# Patient Record
Sex: Female | Born: 1990
Health system: Southern US, Community
[De-identification: ages and names within clinical notes are randomized; demographics above are authoritative.]

## PROBLEM LIST (undated history)

## (undated) DIAGNOSIS — E785 Hyperlipidemia, unspecified: Secondary | ICD-10-CM

## (undated) DIAGNOSIS — D649 Anemia, unspecified: Secondary | ICD-10-CM

## (undated) HISTORY — DX: Anemia, unspecified: D64.9

## (undated) HISTORY — PX: WISDOM TOOTH EXTRACTION: SHX21

## (undated) HISTORY — DX: Hyperlipidemia, unspecified: E78.5

---

## 2015-04-11 ENCOUNTER — Emergency Department (HOSPITAL_COMMUNITY)
Admission: EM | Admit: 2015-04-11 | Discharge: 2015-04-11 | Disposition: A | Discharge status: Home / Self Care | Payer: BLUE CROSS/BLUE SHIELD | Source: Home / Self Care | Attending: Family Medicine | Admitting: Family Medicine

## 2015-04-11 ENCOUNTER — Encounter (HOSPITAL_COMMUNITY): Payer: Self-pay | Admitting: Emergency Medicine

## 2015-04-11 DIAGNOSIS — L02412 Cutaneous abscess of left axilla: Secondary | ICD-10-CM

## 2015-04-11 MED ORDER — SULFAMETHOXAZOLE-TRIMETHOPRIM 800-160 MG PO TABS: 2.0000 | ORAL_TABLET | Freq: Two times a day (BID) | ORAL | Status: DC

## 2015-04-11 MED ORDER — IBUPROFEN 600 MG PO TABS: 600.0000 mg | ORAL_TABLET | Freq: Three times a day (TID) | ORAL | Status: DC | PRN

## 2015-04-11 NOTE — ED Provider Notes (Signed)
Stacy [name] is a 24 y.o. female who presents to Urgent Care today for left axillary abscess present for 3 days. Symptoms worsening recently. She was able to express a tiny amount of pus 2 days ago. No fevers chills nausea vomiting or diarrhea. She's tried ibuprofen for pain which helps. She feels well otherwise. Patient avoids become pregnant by not having sex.   History reviewed. No pertinent past medical history. History reviewed. No pertinent past surgical history. History  Substance Use Topics  . Smoking status: Never Smoker   . Smokeless tobacco: Not on file  . Alcohol Use: Yes   ROS as above Medications: No current facility-administered medications for this encounter.   Current Outpatient Prescriptions  Medication Sig Dispense Refill  . atorvastatin (LIPITOR) 10 MG tablet Take 10 mg by mouth daily.    [name]. ibuprofen (ADVIL,MOTRIN) 600 MG tablet Take 1 tablet (600 mg total) by mouth every 8 (eight) hours as needed. 30 tablet 0  . sulfamethoxazole-trimethoprim (BACTRIM DS,SEPTRA DS) 800-160 MG per tablet Take 2 tablets by mouth 2 (two) times daily. 28 tablet 0   Allergies  Allergen Reactions  . Doxycycline Other (See Comments)    Decreased WBC     Exam:  BP 125/85 mmHg  Pulse 81  Temp(Src) 98.9 F (37.2 C) (Oral)  Resp 12  SpO2 98%  LMP 03/14/2015 Gen: Well NAD HEENT: EOMI,  MMM Exts: Brisk capillary refill, warm and well perfused.  Left axilla indurated tender 1 cm papule. No fluctuance.  No results found for this or any previous visit (from the past 24 hour(s)). No results found.  Assessment and Plan: 24 y.o. female with left axillary abscess. Not yet fluctuant or drainable. Trial of oral antibiotics. We'll use Bactrim this patient is allergic to doxycycline.  Discussed warning signs or symptoms. Please see discharge instructions. Patient expresses understanding.     Rodolph BongEvan S Samona Chihuahua, MD 04/11/15 [phone]

## 2015-04-11 NOTE — ED Notes (Signed)
C/o abscess under left axilla onset 3 days... Getting bigger and more painful Reports drainage Denies fevers, and chills Alert, no signs of acute distress.

## 2015-04-11 NOTE — Discharge Instructions (Signed)
Thank you for coming in today. Take the Bactrim antibiotics. If not better please return for drainage.   Abscess An abscess is an infected area that contains a collection of pus and debris.It can occur in almost any part of the body. An abscess is also known as a furuncle or boil. CAUSES  An abscess occurs when tissue gets infected. This can occur from blockage of oil or sweat glands, infection of hair follicles, or a minor injury to the skin. As the body tries to fight the infection, pus collects in the area and creates pressure under the skin. This pressure causes pain. People with weakened immune systems have difficulty fighting infections and get certain abscesses more often.  SYMPTOMS Usually an abscess develops on the skin and becomes a painful mass that is red, warm, and tender. If the abscess forms under the skin, you may feel a moveable soft area under the skin. Some abscesses break open (rupture) on their own, but most will continue to get worse without care. The infection can spread deeper into the body and eventually into the bloodstream, causing you to feel ill.  DIAGNOSIS  Your caregiver will take your medical history and perform a physical exam. A sample of fluid may also be taken from the abscess to determine what is causing your infection. TREATMENT  Your caregiver may prescribe antibiotic medicines to fight the infection. However, taking antibiotics alone usually does not cure an abscess. Your caregiver may need to make a small cut (incision) in the abscess to drain the pus. In some cases, gauze is packed into the abscess to reduce pain and to continue draining the area. HOME CARE INSTRUCTIONS   Only take over-the-counter or prescription medicines for pain, discomfort, or fever as directed by your caregiver.  If you were prescribed antibiotics, take them as directed. Finish them even if you start to feel better.  If gauze is used, follow your caregiver's directions for changing  the gauze.  To avoid spreading the infection:  Keep your draining abscess covered with a bandage.  Wash your hands well.  Do not share personal care items, towels, or whirlpools with others.  Avoid skin contact with others.  Keep your skin and clothes clean around the abscess.  Keep all follow-up appointments as directed by your caregiver. SEEK MEDICAL CARE IF:   You have increased pain, swelling, redness, fluid drainage, or bleeding.  You have muscle aches, chills, or a general ill feeling.  You have a fever. MAKE SURE YOU:   Understand these instructions.  Will watch your condition.  Will get help right away if you are not doing well or get worse. Document Released: 07/11/2005 Document Revised: 04/01/2012 Document Reviewed: 12/14/2011 [facility] Patient Information 2015 [place], [place]. This information is not intended to replace advice given to you by your health care provider. Make sure you discuss any questions you have with your health care provider.

## 2015-04-21 ENCOUNTER — Encounter (HOSPITAL_COMMUNITY): Payer: Self-pay | Admitting: Emergency Medicine

## 2015-04-21 ENCOUNTER — Emergency Department (INDEPENDENT_AMBULATORY_CARE_PROVIDER_SITE_OTHER)
Admission: EM | Admit: 2015-04-21 | Discharge: 2015-04-21 | Disposition: A | Discharge status: Home / Self Care | Payer: BLUE CROSS/BLUE SHIELD | Source: Home / Self Care | Attending: Family Medicine | Admitting: Family Medicine

## 2015-04-21 DIAGNOSIS — L02412 Cutaneous abscess of left axilla: Secondary | ICD-10-CM

## 2015-04-21 MED ORDER — SULFAMETHOXAZOLE-TRIMETHOPRIM 800-160 MG PO TABS: 1.0000 | ORAL_TABLET | Freq: Two times a day (BID) | ORAL | Status: AC

## 2015-04-21 NOTE — ED Notes (Signed)
Pt returns today for abscess under left arm recheck Pt was seen last week, treated with ATB's , tender knot still under arm No drainage noted or redness noted Swollen lymph nodes to neck area with flu like sx's  Temp 99.4

## 2015-04-21 NOTE — ED Provider Notes (Signed)
CSN: [id number]     Arrival date & time 04/21/15  1613 History   First MD [id number] Contact with Patient 04/21/15 1751     Chief Complaint  Patient presents with  . Wound Check  . Adenopathy   (Consider location/radiation/quality/duration/timing/severity/associated sxs/prior Treatment) Patient is a 24 y.o. female presenting with wound check. The history is provided by the patient and a parent.  Wound Check This is a recurrent problem. The current episode started more than 1 week ago (seen 6/27 for left axillary abscess, given septra ds, took allwithout problems but abscess not resolved.). The problem has been gradually improving. Associated symptoms include headaches.    History reviewed. No pertinent past medical history. History reviewed. No pertinent past surgical history. No family history on file. History  Substance Use Topics  . Smoking status: Never Smoker   . Smokeless tobacco: Not on file  . Alcohol Use: Yes   OB History    No data available     Review of Systems  Constitutional: Negative.   Skin: Positive for rash.  Neurological: Positive for headaches.  Hematological: Positive for adenopathy.    Allergies  Doxycycline  Home Medications   Prior to Admission medications   Medication Sig Start Date End Date Taking? Authorizing Provider  atorvastatin (LIPITOR) 10 MG tablet Take 10 mg by mouth daily.    Historical Provider, MD  ibuprofen (ADVIL,MOTRIN) 600 MG tablet Take 1 tablet (600 mg total) by mouth every 8 (eight) hours as needed. 04/11/15   Rodolph BongEvan S Corey, MD  sulfamethoxazole-trimethoprim (BACTRIM DS,SEPTRA DS) 800-160 MG per tablet Take 1 tablet by mouth 2 (two) times daily. 04/21/15 04/28/15  Linna HoffJames D Ether Wolters, MD   BP 120/86 mmHg  Pulse 84  Temp(Src) 99.4 F (37.4 C) (Oral)  Resp 18  SpO2 98%  LMP 03/14/2015 Physical Exam  Constitutional: She is oriented to person, place, and time. She appears well-developed and well-nourished. She appears distressed.   Musculoskeletal:  Left ax adenopathy.  Lymphadenopathy:    She has cervical adenopathy.  Neurological: She is alert and oriented to person, place, and time.  Skin: Skin is warm and dry. There is erythema.  1.5 cm fluctuant abscess to left axilla with assoc adenopathy.  Nursing note and vitals reviewed.   ED Course  INCISION AND DRAINAGE Date/Time: 04/21/2015 6:31 PM Performed by: Linna HoffKINDL, Destyn Schuyler D Authorized by: Bradd CanaryKINDL, Shaughnessy Gethers D Consent: Verbal consent obtained. Consent given by: patient Type: abscess Body area: trunk Local anesthetic: topical anesthetic Patient sedated: no Scalpel size: 11 Incision type: single straight Complexity: simple Drainage: purulent Drainage amount: moderate Wound treatment: wound left open Patient tolerance: Patient tolerated the procedure well with no immediate complications Comments: Cultured abscess fluid.   (including critical care time) Labs Review Labs Reviewed  WOUND CULTURE    Imaging Review No results found.   MDM   1. Abscess of axilla, left        Linna HoffJames D Aydan Levitz, MD 04/21/15 [phone]

## 2015-04-21 NOTE — Discharge Instructions (Signed)
Warm compress twice a day when you take the antibiotic, take all of medicine, return as needed. °

## 2015-04-24 LAB — WOUND CULTURE: Culture: NO GROWTH

## 2015-04-25 NOTE — ED Notes (Signed)
Final report of wound culture negative. No further action required

## 2015-12-02 ENCOUNTER — Ambulatory Visit (INDEPENDENT_AMBULATORY_CARE_PROVIDER_SITE_OTHER): Payer: 59 | Admitting: Family Medicine

## 2015-12-02 VITALS — BP 110/80 | HR 100 | Temp 98.5°F | Resp 18 | Ht 64.57 in | Wt 121.8 lb

## 2015-12-02 DIAGNOSIS — J029 Acute pharyngitis, unspecified: Secondary | ICD-10-CM

## 2015-12-02 DIAGNOSIS — H9201 Otalgia, right ear: Secondary | ICD-10-CM | POA: Diagnosis not present

## 2015-12-02 DIAGNOSIS — B349 Viral infection, unspecified: Secondary | ICD-10-CM

## 2015-12-02 LAB — POCT RAPID STREP A (OFFICE): Rapid Strep A Screen: NEGATIVE

## 2015-12-02 MED ORDER — AMOXICILLIN 875 MG PO TABS: 875.0000 mg | ORAL_TABLET | Freq: Two times a day (BID) | ORAL | Status: DC

## 2015-12-02 NOTE — Patient Instructions (Signed)
Drink plenty of fluids and get enough rest  I recommend taking some Sudafed or using Afrin nose spray prior to flying since you're already having ear pain.  If the ear gets acutely worse go ahead and begin taking the course of amoxicillin 875 twice daily  Tylenol and/or ibuprofen for aching or fever  Lozenges for the throat pain  Return as needed or feel free to call if necessary.

## 2015-12-02 NOTE — Progress Notes (Signed)
Patient ID: Stacy [name], female    DOB: 1991-01-15  Age: 25 y.o. MRN: [id number]  Chief Complaint  Patient presents with  . Sore Throat    x 4 days  . 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 She has been ill for for 5 days. She has had some sore throat. She is gotten feeling worse. She has a hard time swallowing. Feels similar to how she is felt in the past which she's had strep. She has some pain in her right ear now. She does have slight cough. Headache and pressure, but not a lot of nasal drainage.  Current allergies, medications, problem list, past/family and social histories reviewed.  Objective:  BP 110/80 mmHg  Pulse 100  Temp(Src) 98.5 F (36.9 C) (Oral)  Resp 18  Ht 5' 4.57" (1.64 m)  Wt 121 lb 12.8 oz (55.248 kg)  BMI 20.54 kg/m2  SpO2 93%  LMP 11/10/2015  Pleasant young lady in no major distress. Her TMs are normal. Throat has mild erythema without any exudate or edema. Neck supple without significant nodes. Chest clear to auscultation. Heart regular.  Assessment & Plan:   Assessment: 1. Acute pharyngitis, unspecified etiology   2. Otalgia of right ear   3. Viral syndrome       Plan: Check a strep test Results for orders placed or performed in visit on 12/02/15  POCT rapid strep A  Result Value Ref Range   Rapid Strep A Screen Negative Negative    Orders Placed This Encounter  Procedures  . POCT rapid strep A    Meds ordered this encounter  Medications  . amoxicillin (AMOXIL) 875 MG tablet    Sig: Take 1 tablet (875 mg total) by mouth 2 (two) times daily.    Dispense:  20 tablet    Refill:  0     Patient is flying to [place]. Even though the ear looks fine I gave her a prescription for antibiotics to begin if her ear is getting worse.    Patient Instructions  Drink plenty of fluids and get enough rest  I recommend taking some Sudafed or using Afrin nose spray prior to  flying since you're already having ear pain.  If the ear gets acutely worse go ahead and begin taking the course of amoxicillin 875 twice daily  Tylenol and/or ibuprofen for aching or fever  Lozenges for the throat pain  Return as needed or feel free to call if necessary.     Return if symptoms worsen or fail to improve.   Stacy Gaulin, MD 12/02/2015

## 2016-01-23 ENCOUNTER — Encounter: Payer: Self-pay | Admitting: Internal Medicine

## 2016-01-23 ENCOUNTER — Ambulatory Visit (INDEPENDENT_AMBULATORY_CARE_PROVIDER_SITE_OTHER): Payer: 59 | Admitting: Internal Medicine

## 2016-01-23 VITALS — BP 124/84 | HR 92 | Temp 98.7°F | Resp 16 | Ht 64.0 in | Wt 121.0 lb

## 2016-01-23 DIAGNOSIS — Z0000 Encounter for general adult medical examination without abnormal findings: Secondary | ICD-10-CM

## 2016-01-23 DIAGNOSIS — L210 Seborrhea capitis: Secondary | ICD-10-CM

## 2016-01-23 DIAGNOSIS — E785 Hyperlipidemia, unspecified: Secondary | ICD-10-CM

## 2016-01-23 DIAGNOSIS — E7849 Other hyperlipidemia: Secondary | ICD-10-CM | POA: Insufficient documentation

## 2016-01-23 DIAGNOSIS — D509 Iron deficiency anemia, unspecified: Secondary | ICD-10-CM | POA: Insufficient documentation

## 2016-01-23 MED ORDER — KETOCONAZOLE 1 % EX SHAM: MEDICATED_SHAMPOO | CUTANEOUS | Status: DC

## 2016-01-23 NOTE — Assessment & Plan Note (Signed)
Getting a vegetarian with fish diet Irregular exercise Will check lipid panel Given her age and low risk and would not recommend a statin, which I discussed with her Monitor cholesterol Work on increasing exercise

## 2016-01-23 NOTE — Assessment & Plan Note (Signed)
Ketoconazole shampoo reviewed-continue to use weekly Can refer to dermatology if this is no longer effective

## 2016-01-23 NOTE — Patient Instructions (Signed)
Test(s) ordered today. Your results will be released to MyChart (or called to you) after review, usually within 72hours after test completion. If any changes need to be made, you will be notified at that same time.  All other Health Maintenance issues reviewed.   All recommended immunizations and age-appropriate screenings are up-to-date or discussed.  No immunizations administered today.   Medications reviewed and updated.  No changes recommended at this time.  Your prescription(s) have been submitted to your pharmacy. Please take as directed and contact our office if you believe you are having problem(s) with the medication(s).    Health Maintenance, Female Adopting a healthy lifestyle and getting preventive care can go a long way to promote health and wellness. Talk with your health care provider about what schedule of regular examinations is right for you. This is a good chance for you to check in with your provider about disease prevention and staying healthy. In between checkups, there are plenty of things you can do on your own. Experts have done a lot of research about which lifestyle changes and preventive measures are most likely to keep you healthy. Ask your health care provider for more information. WEIGHT AND DIET  Eat a healthy diet  Be sure to include plenty of vegetables, fruits, low-fat dairy products, and lean protein.  Do not eat a lot of foods high in solid fats, added sugars, or salt.  Get regular exercise. This is one of the most important things you can do for your health.  Most adults should exercise for at least 150 minutes each week. The exercise should increase your heart rate and make you sweat (moderate-intensity exercise).  Most adults should also do strengthening exercises at least twice a week. This is in addition to the moderate-intensity exercise.  Maintain a healthy weight  Body mass index (BMI) is a measurement that can be used to identify possible  weight problems. It estimates body fat based on height and weight. Your health care provider can help determine your BMI and help you achieve or maintain a healthy weight.  For females 20 years of age and older:   A BMI below 18.5 is considered underweight.  A BMI of 18.5 to 24.9 is normal.  A BMI of 25 to 29.9 is considered overweight.  A BMI of 30 and above is considered obese.  Watch levels of cholesterol and blood lipids  You should start having your blood tested for lipids and cholesterol at 25 years of age, then have this test every 5 years.  You may need to have your cholesterol levels checked more often if:  Your lipid or cholesterol levels are high.  You are older than 25 years of age.  You are at high risk for heart disease.  CANCER SCREENING   Lung Cancer  Lung cancer screening is recommended for adults 55-80 years old who are at high risk for lung cancer because of a history of smoking.  A yearly low-dose CT scan of the lungs is recommended for people who:  Currently smoke.  Have quit within the past 15 years.  Have at least a 30-pack-year history of smoking. A pack year is smoking an average of one pack of cigarettes a day for 1 year.  Yearly screening should continue until it has been 15 years since you quit.  Yearly screening should stop if you develop a health problem that would prevent you from having lung cancer treatment.  Breast Cancer  Practice breast self-awareness. This means   how your breasts normally appear and feel.  It also means doing regular breast self-exams. Let your health care provider know about any changes, no matter how small.  If you are in your 20s or 30s, you should have a clinical breast exam (CBE) by a health care provider every 1-3 years as part of a regular health exam.  If you are 59 or older, have a CBE every year. Also consider having a breast X-ray (mammogram) every year.  If you have a family history of  breast cancer, talk to your health care provider about genetic screening.  If you are at high risk for breast cancer, talk to your health care provider about having an MRI and a mammogram every year.  Breast cancer gene (BRCA) assessment is recommended for women who have family members with BRCA-related cancers. BRCA-related cancers include:  Breast.  Ovarian.  Tubal.  Peritoneal cancers.  Results of the assessment will determine the need for genetic counseling and BRCA1 and BRCA2 testing. Cervical Cancer Your health care provider may recommend that you be screened regularly for cancer of the pelvic organs (ovaries, uterus, and vagina). This screening involves a pelvic examination, including checking for microscopic changes to the surface of your cervix (Pap test). You may be encouraged to have this screening done every 3 years, beginning at age 61.  For women ages 41-65, health care providers may recommend pelvic exams and Pap testing every 3 years, or they may recommend the Pap and pelvic exam, combined with testing for human papilloma virus (HPV), every 5 years. Some types of HPV increase your risk of cervical cancer. Testing for HPV may also be done on women of any age with unclear Pap test results.  Other health care providers may not recommend any screening for nonpregnant women who are considered low risk for pelvic cancer and who do not have symptoms. Ask your health care provider if a screening pelvic exam is right for you.  If you have had past treatment for cervical cancer or a condition that could lead to cancer, you need Pap tests and screening for cancer for at least 20 years after your treatment. If Pap tests have been discontinued, your risk factors (such as having a new sexual partner) need to be reassessed to determine if screening should resume. Some women have medical problems that increase the chance of getting cervical cancer. In these cases, your health care provider may  recommend more frequent screening and Pap tests. Colorectal Cancer  This type of cancer can be detected and often prevented.  Routine colorectal cancer screening usually begins at 25 years of age and continues through 25 years of age.  Your health care provider may recommend screening at an earlier age if you have risk factors for colon cancer.  Your health care provider may also recommend using home test kits to check for hidden blood in the stool.  A small camera at the end of a tube can be used to examine your colon directly (sigmoidoscopy or colonoscopy). This is done to check for the earliest forms of colorectal cancer.  Routine screening usually begins at age 47.  Direct examination of the colon should be repeated every 5-10 years through 25 years of age. However, you may need to be screened more often if early forms of precancerous polyps or small growths are found. Skin Cancer  Check your skin from head to toe regularly.  Tell your health care provider about any new moles or changes in moles,  especially if there is a change in a mole's shape or color.  Also tell your health care provider if you have a mole that is larger than the size of a pencil eraser.  Always use sunscreen. Apply sunscreen liberally and repeatedly throughout the day.  Protect yourself by wearing long sleeves, pants, a wide-brimmed hat, and sunglasses whenever you are outside. HEART DISEASE, DIABETES, AND HIGH BLOOD PRESSURE   High blood pressure causes heart disease and increases the risk of stroke. High blood pressure is more likely to develop in:  People who have blood pressure in the high end of the normal range (130-139/85-89 mm Hg).  People who are overweight or obese.  People who are African American.  If you are 76-56 years of age, have your blood pressure checked every 3-5 years. If you are 7 years of age or older, have your blood pressure checked every year. You should have your blood  pressure measured twice--once when you are at a hospital or clinic, and once when you are not at a hospital or clinic. Record the average of the two measurements. To check your blood pressure when you are not at a hospital or clinic, you can use:  An automated blood pressure machine at a pharmacy.  A home blood pressure monitor.  If you are between 42 years and 22 years old, ask your health care provider if you should take aspirin to prevent strokes.  Have regular diabetes screenings. This involves taking a blood sample to check your fasting blood sugar level.  If you are at a normal weight and have a low risk for diabetes, have this test once every three years after 25 years of age.  If you are overweight and have a high risk for diabetes, consider being tested at a younger age or more often. PREVENTING INFECTION  Hepatitis B  If you have a higher risk for hepatitis B, you should be screened for this virus. You are considered at high risk for hepatitis B if:  You were born in a country where hepatitis B is common. Ask your health care provider which countries are considered high risk.  Your parents were born in a high-risk country, and you have not been immunized against hepatitis B (hepatitis B vaccine).  You have HIV or AIDS.  You use needles to inject street drugs.  You live with someone who has hepatitis B.  You have had sex with someone who has hepatitis B.  You get hemodialysis treatment.  You take certain medicines for conditions, including cancer, organ transplantation, and autoimmune conditions. Hepatitis C  Blood testing is recommended for:  Everyone born from [birth year] through 1965.  Anyone with known risk factors for hepatitis C. Sexually transmitted infections (STIs)  You should be screened for sexually transmitted infections (STIs) including gonorrhea and chlamydia if:  You are sexually active and are younger than 25 years of age.  You are older than 25 years  of age and your health care provider tells you that you are at risk for this type of infection.  Your sexual activity has changed since you were last screened and you are at an increased risk for chlamydia or gonorrhea. Ask your health care provider if you are at risk.  If you do not have HIV, but are at risk, it may be recommended that you take a prescription medicine daily to prevent HIV infection. This is called pre-exposure prophylaxis (PrEP). You are considered at risk if:  You are sexually active  and do not regularly use condoms or know the HIV status of your partner(s).  You take drugs by injection.  You are sexually active with a partner who has HIV. Talk with your health care provider about whether you are at high risk of being infected with HIV. If you choose to begin PrEP, you should first be tested for HIV. You should then be tested every 3 months for as long as you are taking PrEP.  PREGNANCY   If you are premenopausal and you may become pregnant, ask your health care provider about preconception counseling.  If you may become pregnant, take 400 to 800 micrograms (mcg) of folic acid every day.  If you want to prevent pregnancy, talk to your health care provider about birth control (contraception). OSTEOPOROSIS AND MENOPAUSE   Osteoporosis is a disease in which the bones lose minerals and strength with aging. This can result in serious bone fractures. Your risk for osteoporosis can be identified using a bone density scan.  If you are 51 years of age or older, or if you are at risk for osteoporosis and fractures, ask your health care provider if you should be screened.  Ask your health care provider whether you should take a calcium or vitamin D supplement to lower your risk for osteoporosis.  Menopause may have certain physical symptoms and risks.  Hormone replacement therapy may reduce some of these symptoms and risks. Talk to your health care provider about whether hormone  replacement therapy is right for you.  HOME CARE INSTRUCTIONS   Schedule regular health, dental, and eye exams.  Stay current with your immunizations.   Do not use any tobacco products including cigarettes, chewing tobacco, or electronic cigarettes.  If you are pregnant, do not drink alcohol.  If you are breastfeeding, limit how much and how often you drink alcohol.  Limit alcohol intake to no more than 1 drink per day for nonpregnant women. One drink equals 12 ounces of beer, 5 ounces of wine, or 1 ounces of hard liquor.  Do not use street drugs.  Do not share needles.  Ask your health care provider for help if you need support or information about quitting drugs.  Tell your health care provider if you often feel depressed.  Tell your health care provider if you have ever been abused or do not feel safe at home.   This information is not intended to replace advice given to you by your health care provider. Make sure you discuss any questions you have with your health care provider.   Document Released: 04/16/2011 Document Revised: 10/22/2014 Document Reviewed: 09/02/2013 Elsevier Interactive Patient Education [employer].

## 2016-01-23 NOTE — Progress Notes (Signed)
Pre visit review using our clinic review tool, if applicable. No additional management support is needed unless otherwise documented below in the visit note. 

## 2016-01-23 NOTE — Assessment & Plan Note (Signed)
Check iron levels, CBC Taking iron supplementation daily

## 2016-01-23 NOTE — Progress Notes (Signed)
Subjective:    Patient ID: Stacy [name], female    DOB: 11/08/1990, 25 y.o.   MRN: [id number]  HPI She is here to establish with a new pcp.  She is here for a physical exam.   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     She is a history of high cholesterol. She was on medication for approximately 5 months.  She does have a family history of high cholesterol. She has several members. His cholesterol measurements.  She is exercising irregularly.  She is now a Ambulance personpescatarian.  She is concerned about what her cholesterol is normal.  She has a history of severe dandruff. She has used ketoconazole shampoo in the past and typically works well, but does not feel be working as well. She has seen dermatology in the past. She does need a refill today.  Ann deficiency anemia: She has a history of iron deficiency anemia. She is currently taking iron supplements.  Medications and allergies reviewed with patient and updated if appropriate.  Patient Active Problem List   Diagnosis Date Noted  . Hyperlipidemia 01/23/2016  . Dandruff 01/23/2016  . Iron deficiency anemia 01/23/2016    Current Outpatient Prescriptions on File Prior to Visit  Medication Sig Dispense Refill  . ibuprofen (ADVIL,MOTRIN) 600 MG tablet Take 1 tablet (600 mg total) by mouth every 8 (eight) hours as needed. 30 tablet 0   No current facility-administered medications on file prior to visit.    Past Medical History  Diagnosis Date  . Hyperlipidemia   . Anemia     Past Surgical History  Procedure Laterality Date  . Wisdom tooth extraction      Social History   Social History  . Marital Status: Single    Spouse Name: N/A  . Number of Children: N/A  . Years of Education: N/A   Social History Main Topics  . Smoking status: Never Smoker   . Smokeless tobacco: None  . Alcohol Use: Yes  . Drug Use: No  . Sexual Activity: Not Asked   Other Topics Concern  . None   Social History Narrative    Family History  Problem  Relation Age of Onset  . Diabetes Mother   . Autoimmune disease Mother   . Hyperlipidemia Father   . Cancer Maternal Grandmother     breast  . Cancer Paternal Grandmother     breast  . Heart disease Paternal Grandmother     Review of Systems  Constitutional: Negative for fever and chills.  Eyes: Negative for visual disturbance.  Respiratory: Negative for cough, shortness of breath and wheezing.   Cardiovascular: Negative for chest pain, palpitations and leg swelling.  Gastrointestinal: Negative for nausea, diarrhea, constipation and blood in stool. Abdominal pain: occasional.       No gerd  Genitourinary: Negative for dysuria and hematuria.  Musculoskeletal: Positive for back pain (from sitting). Negative for myalgias and arthralgias.  Skin: Negative for color change and rash.       Dandruff   Neurological: Negative for dizziness, light-headedness and headaches.  Psychiatric/Behavioral: Negative for dysphoric mood. The patient is not nervous/anxious.        Objective:   Filed Vitals:   01/23/16 1455  BP: 124/84  Pulse: 92  Temp: 98.7 F (37.1 C)  Resp: 16   Filed Weights   01/23/16 1455  Weight: 121 lb (54.885 kg)   Body mass index is 20.76 kg/(m^2).   Physical Exam Constitutional: She appears well-developed and well-nourished. No distress.  HENT:  Head: Normocephalic and atraumatic.  Right Ear: External ear normal. Normal ear canal and TM Left Ear: External ear normal.  Normal ear canal and TM Mouth/Throat: Oropharynx is clear and moist.  Eyes: Conjunctivae and EOM are normal.  Neck: Neck supple. No tracheal deviation present. No thyromegaly present.  Cardiovascular: Normal rate, regular rhythm and normal heart sounds.   No murmur heard.  No edema. Pulmonary/Chest: Effort normal and breath sounds normal. No respiratory distress. She has no wheezes. She has no rales.  Breast: deferred to Gyn Abdominal: Soft. She exhibits no distension. There is no tenderness.    Lymphadenopathy: She has no cervical adenopathy.  Skin: Skin is warm and dry. She is not diaphoretic.  Psychiatric: She has a normal mood and affect. Her behavior is normal.       Assessment & Plan:   Physical exam: Screening blood work ordered Immunizations up to date Gyn Up to date  Exercise - irregular exercise - stressed regular exercise Weight - normal Skin  -  Just dandruff, no other skin concerns Substance abuse - no abuse  See Problem List for Assessment and Plan of chronic medical problems.

## 2016-01-25 ENCOUNTER — Other Ambulatory Visit (INDEPENDENT_AMBULATORY_CARE_PROVIDER_SITE_OTHER): Payer: 59

## 2016-01-25 DIAGNOSIS — Z0000 Encounter for general adult medical examination without abnormal findings: Secondary | ICD-10-CM

## 2016-01-25 LAB — COMPREHENSIVE METABOLIC PANEL
ALBUMIN: 4.1 g/dL (ref 3.5–5.2)
ALT: 6 U/L (ref 0–35)
AST: 15 U/L (ref 0–37)
Alkaline Phosphatase: 40 U/L (ref 39–117)
BUN: 10 mg/dL (ref 6–23)
CALCIUM: 9.5 mg/dL (ref 8.4–10.5)
CO2: 29 mEq/L (ref 19–32)
Chloride: 104 mEq/L (ref 96–112)
Creatinine, Ser: 0.75 mg/dL (ref 0.40–1.20)
GFR: 121.31 mL/min (ref >60.00)
Glucose, Bld: 84 mg/dL (ref 70–99)
POTASSIUM: 3.9 meq/L (ref 3.5–5.1)
Sodium: 138 mEq/L (ref 135–145)
TOTAL PROTEIN: 7.2 g/dL (ref 6.0–8.3)
Total Bilirubin: 0.4 mg/dL (ref 0.2–1.2)

## 2016-01-25 LAB — LIPID PANEL
CHOLESTEROL: 194 mg/dL (ref 0–200)
HDL: 67.3 mg/dL (ref >39.00)
LDL CALC: 110 mg/dL — AB (ref 0–99)
NonHDL: 126.85
TRIGLYCERIDES: 85 mg/dL (ref 0.0–149.0)
Total CHOL/HDL Ratio: 3
VLDL: 17 mg/dL (ref 0.0–40.0)

## 2016-01-25 LAB — CBC WITH DIFFERENTIAL/PLATELET
BASOS ABS: 0 10*3/uL (ref 0.0–0.1)
Basophils Relative: 0.9 % (ref 0.0–3.0)
EOS PCT: 4.9 % (ref 0.0–5.0)
Eosinophils Absolute: 0.2 10*3/uL (ref 0.0–0.7)
HEMATOCRIT: 31.9 % — AB (ref 36.0–46.0)
HEMOGLOBIN: 10.6 g/dL — AB (ref 12.0–15.0)
LYMPHS PCT: 50.2 % — AB (ref 12.0–46.0)
Lymphs Abs: 1.7 10*3/uL (ref 0.7–4.0)
MCHC: 33.2 g/dL (ref 30.0–36.0)
MCV: 79.3 fl (ref 78.0–100.0)
MONOS PCT: 11.3 % (ref 3.0–12.0)
Monocytes Absolute: 0.4 10*3/uL (ref 0.1–1.0)
Neutro Abs: 1.1 10*3/uL — ABNORMAL LOW (ref 1.4–7.7)
Neutrophils Relative %: 32.7 % — ABNORMAL LOW (ref 43.0–77.0)
Platelets: 271 10*3/uL (ref 150.0–400.0)
RBC: 4.02 Mil/uL (ref 3.87–5.11)
RDW: 22.1 % — ABNORMAL HIGH (ref 11.5–15.5)
WBC: 3.4 10*3/uL — AB (ref 4.0–10.5)

## 2016-01-25 LAB — TSH: TSH: 0.94 u[IU]/mL (ref 0.35–4.50)

## 2016-01-25 LAB — FERRITIN: FERRITIN: 5.5 ng/mL — AB (ref 10.0–291.0)

## 2016-01-25 LAB — IRON: Iron: 41 ug/dL — ABNORMAL LOW (ref 42–145)

## 2016-01-30 ENCOUNTER — Encounter: Payer: Self-pay | Admitting: Internal Medicine

## 2016-01-30 DIAGNOSIS — D509 Iron deficiency anemia, unspecified: Secondary | ICD-10-CM

## 2016-02-08 ENCOUNTER — Encounter: Payer: Self-pay | Admitting: Internal Medicine

## 2016-02-09 MED ORDER — KETOCONAZOLE 2 % EX SHAM: 1.0000 "application " | MEDICATED_SHAMPOO | CUTANEOUS | Status: DC

## 2016-02-13 DIAGNOSIS — H52223 Regular astigmatism, bilateral: Secondary | ICD-10-CM | POA: Diagnosis not present

## 2016-02-13 DIAGNOSIS — H5213 Myopia, bilateral: Secondary | ICD-10-CM | POA: Diagnosis not present

## 2016-07-05 ENCOUNTER — Encounter: Payer: Self-pay | Admitting: Family

## 2016-07-05 ENCOUNTER — Ambulatory Visit (INDEPENDENT_AMBULATORY_CARE_PROVIDER_SITE_OTHER): Payer: 59 | Admitting: Family

## 2016-07-05 DIAGNOSIS — J069 Acute upper respiratory infection, unspecified: Secondary | ICD-10-CM

## 2016-07-05 MED ORDER — ALBUTEROL SULFATE HFA 108 (90 BASE) MCG/ACT IN AERS: 2.0000 | INHALATION_SPRAY | RESPIRATORY_TRACT | 1 refills | Status: DC | PRN

## 2016-07-05 MED ORDER — HYDROCOD POLST-CPM POLST ER 10-8 MG/5ML PO SUER: 5.0000 mL | Freq: Every evening | ORAL | 0 refills | Status: DC | PRN

## 2016-07-05 MED ORDER — FLUTICASONE PROPIONATE 50 MCG/ACT NA SUSP: 2.0000 | Freq: Every day | NASAL | 2 refills | Status: DC

## 2016-07-05 MED ORDER — ALBUTEROL SULFATE (2.5 MG/3ML) 0.083% IN NEBU: 2.5000 mg | INHALATION_SOLUTION | Freq: Four times a day (QID) | RESPIRATORY_TRACT | 1 refills | Status: DC | PRN

## 2016-07-05 NOTE — Patient Instructions (Signed)
Thank you for choosing [facility].  SUMMARY AND INSTRUCTIONS:  Medication:  Your prescription(s) have been submitted to your pharmacy or been printed and provided for you. Please take as directed and contact our office if you believe you are having problem(s) with the medication(s) or have any questions.  Follow up:  If your symptoms worsen or fail to improve, please contact our office for further instruction, or in case of emergency go directly to the emergency room at the closest medical facility.    General Recommendations:    Please drink plenty of fluids.  Get plenty of rest   Sleep in humidified air  Use saline nasal sprays  Netti pot   OTC Medications:  Decongestants - helps relieve congestion   Flonase (generic fluticasone) or Nasacort (generic triamcinolone) - please make sure to use the "cross-over" technique at a 45 degree angle towards the opposite eye as opposed to straight up the nasal passageway.   Sudafed (generic pseudoephedrine - Note this is the one that is available behind the pharmacy counter); Products with phenylephrine (-PE) may also be used but is often not as effective as pseudoephedrine.   If you have HIGH BLOOD PRESSURE - Coricidin HBP; AVOID any product that is -D as this contains pseudoephedrine which may increase your blood pressure.  Afrin (oxymetazoline) every 6-8 hours for up to 3 days.   Allergies - helps relieve runny nose, itchy eyes and sneezing   Claritin (generic loratidine), Allegra (fexofenidine), or Zyrtec (generic cyrterizine) for runny nose. These medications should not cause drowsiness.  Note - Benadryl (generic diphenhydramine) may be used however may cause drowsiness  Cough -   Delsym or Robitussin (generic dextromethorphan)  Expectorants - helps loosen mucus to ease removal   Mucinex (generic guaifenesin) as directed on the package.  Headaches / General Aches   Tylenol (generic acetaminophen) - DO NOT  EXCEED 3 grams (3,000 mg) in a 24 hour time period  Advil/Motrin (generic ibuprofen)   Sore Throat -   Salt water gargle   Chloraseptic (generic benzocaine) spray or lozenges / Sucrets (generic dyclonine)     

## 2016-07-05 NOTE — Progress Notes (Signed)
Subjective:    Patient ID: Stacy [name], female    DOB: 1991/03/03, 25 y.o.   MRN: [id number]  Chief Complaint  Patient presents with  . Cough    x4 days, nasal congestion and drainage, productive cough, wants to see about getting an inhaler, refill of flonase    HPI:  Stacy [name] is a 25 y.o. female who  has a past medical history of Anemia and Hyperlipidemia. and presents today for an acute office visit.   This is a new problem. Associated symptoms of nasal congestion/drainage and productive cough have been going on for approximately 4 days. May have had a fever but was not measured. Has not had any additional subjective fevers in the past 24 hours.. Modifying factors include Mucienx, Nyquil and Flonase . Notes the color of her discharge was originally yellow and now clearing up. Cough is severe enough to disturb her sleep. No recent antibiotic use.    Allergies  Allergen Reactions  . Doxycycline Other (See Comments)    Decreased WBC     Outpatient Medications Prior to Visit  Medication Sig Dispense Refill  . ferrous sulfate 325 (65 FE) MG EC tablet Take 325 mg by mouth daily.     [name] ibuprofen (ADVIL,MOTRIN) 600 MG tablet Take 1 tablet (600 mg total) by mouth every 8 (eight) hours as needed. 30 tablet 0  . ketoconazole (NIZORAL) 2 % shampoo Apply 1 application topically 2 (two) times a week. 120 mL 11   No facility-administered medications prior to visit.     Review of Systems  Constitutional: Positive for fever. Negative for chills.  HENT: Positive for congestion.   Respiratory: Positive for cough.       Objective:    BP 122/74 (BP Location: Left Arm, Patient Position: Sitting, Cuff Size: Normal)   Pulse (!) 112   Temp 98.4 F (36.9 C) (Oral)   Resp 18   Ht 5\' 4"  (1.626 m)   Wt 128 lb (58.1 kg)   SpO2 97%   BMI 21.97 kg/m  Nursing note and vital signs reviewed.  Physical Exam  Constitutional: She is oriented to person, place, and time. She appears  well-developed and well-nourished. No distress.  HENT:  Right Ear: Hearing, tympanic membrane, external ear and ear canal normal.  Left Ear: Hearing, tympanic membrane, external ear and ear canal normal.  Nose: Nose normal.  Mouth/Throat: Uvula is midline, oropharynx is clear and moist and mucous membranes are normal.  Cardiovascular: Normal rate, regular rhythm, normal heart sounds and intact distal pulses.   Pulmonary/Chest: Effort normal and breath sounds normal.  Neurological: She is alert and oriented to person, place, and time.  Skin: Skin is warm and dry.  Psychiatric: She has a normal mood and affect. Her behavior is normal. Judgment and thought content normal.       Assessment & Plan:   Problem List Items Addressed This Visit      Respiratory   Acute upper respiratory infection    Symptoms and exam consistent with acute upper respiratory infection most likely viral. Treat conservatively with over-the-counter medications as needed for symptom relief and supportive care. Start albuterol as needed for wheezing. Start Tessalon as needed for cough and sleep. Continue Flonase as prescribed. Follow-up if symptoms worsen or do not improve.      Relevant Medications   chlorpheniramine-HYDROcodone (TUSSIONEX PENNKINETIC ER) 10-8 MG/5ML SUER   albuterol (PROVENTIL) (2.5 MG/3ML) 0.083% nebulizer solution   fluticasone (FLONASE) 50 MCG/ACT nasal spray  Other Visit Diagnoses   None.      I have changed Stacy [name]'s fluticasone. I am also having her start on chlorpheniramine-HYDROcodone and albuterol. Additionally, I am having her maintain her ibuprofen, ferrous sulfate, and ketoconazole.   Meds ordered this encounter  Medications  . DISCONTD: fluticasone (FLONASE) 50 MCG/ACT nasal spray    Sig: Place into both nostrils daily.  . chlorpheniramine-HYDROcodone (TUSSIONEX PENNKINETIC ER) 10-8 MG/5ML SUER    Sig: Take 5 mLs by mouth at bedtime as needed.    Dispense:  115 mL     Refill:  0    Order Specific Question:   Supervising Provider    Answer:   Hillard DankerRAWFORD, ELIZABETH A [[id number]]  . albuterol (PROVENTIL) (2.5 MG/3ML) 0.083% nebulizer solution    Sig: Take 3 mLs (2.5 mg total) by nebulization every 6 (six) hours as needed for wheezing or shortness of breath.    Dispense:  150 mL    Refill:  1    Order Specific Question:   Supervising Provider    Answer:   Hillard DankerRAWFORD, ELIZABETH A [[id number]]  . fluticasone (FLONASE) 50 MCG/ACT nasal spray    Sig: Place 2 sprays into both nostrils daily.    Dispense:  16 g    Refill:  2    Order Specific Question:   Supervising Provider    Answer:   Hillard DankerRAWFORD, ELIZABETH A [[id number]]     Follow-up: Return if symptoms worsen or fail to improve.  Jeanine Luzalone, Gregory, FNP

## 2016-07-05 NOTE — Assessment & Plan Note (Signed)
Symptoms and exam consistent with acute upper respiratory infection most likely viral. Treat conservatively with over-the-counter medications as needed for symptom relief and supportive care. Start albuterol as needed for wheezing. Start Tessalon as needed for cough and sleep. Continue Flonase as prescribed. Follow-up if symptoms worsen or do not improve.

## 2016-08-28 ENCOUNTER — Other Ambulatory Visit (INDEPENDENT_AMBULATORY_CARE_PROVIDER_SITE_OTHER): Payer: 59

## 2016-08-28 DIAGNOSIS — D509 Iron deficiency anemia, unspecified: Secondary | ICD-10-CM

## 2016-08-28 LAB — CBC WITH DIFFERENTIAL/PLATELET
Basophils Absolute: 0 10*3/uL (ref 0.0–0.1)
Basophils Relative: 0.3 % (ref 0.0–3.0)
EOS PCT: 1.4 % (ref 0.0–5.0)
Eosinophils Absolute: 0.1 10*3/uL (ref 0.0–0.7)
HEMATOCRIT: 39 % (ref 36.0–46.0)
HEMOGLOBIN: 13.3 g/dL (ref 12.0–15.0)
Lymphocytes Relative: 35.5 % (ref 12.0–46.0)
Lymphs Abs: 1.8 10*3/uL (ref 0.7–4.0)
MCHC: 34.1 g/dL (ref 30.0–36.0)
MCV: 89.2 fl (ref 78.0–100.0)
MONO ABS: 0.6 10*3/uL (ref 0.1–1.0)
MONOS PCT: 13 % — AB (ref 3.0–12.0)
Neutro Abs: 2.5 10*3/uL (ref 1.4–7.7)
Neutrophils Relative %: 49.8 % (ref 43.0–77.0)
Platelets: 235 10*3/uL (ref 150.0–400.0)
RBC: 4.37 Mil/uL (ref 3.87–5.11)
RDW: 13.5 % (ref 11.5–15.5)
WBC: 4.9 10*3/uL (ref 4.0–10.5)

## 2016-08-28 LAB — FERRITIN: Ferritin: 29.6 ng/mL (ref 10.0–291.0)

## 2016-08-28 LAB — IRON: Iron: 90 ug/dL (ref 42–145)

## 2016-08-31 ENCOUNTER — Encounter: Payer: Self-pay | Admitting: Internal Medicine

## 2017-01-25 ENCOUNTER — Encounter: Payer: 59 | Admitting: Internal Medicine

## 2017-01-30 ENCOUNTER — Ambulatory Visit (INDEPENDENT_AMBULATORY_CARE_PROVIDER_SITE_OTHER): Payer: 59 | Admitting: Internal Medicine

## 2017-01-30 ENCOUNTER — Encounter: Payer: Self-pay | Admitting: Internal Medicine

## 2017-01-30 VITALS — BP 124/76 | HR 105 | Temp 98.3°F | Resp 16 | Ht 64.0 in | Wt 125.0 lb

## 2017-01-30 DIAGNOSIS — M546 Pain in thoracic spine: Secondary | ICD-10-CM

## 2017-01-30 DIAGNOSIS — D509 Iron deficiency anemia, unspecified: Secondary | ICD-10-CM

## 2017-01-30 DIAGNOSIS — E7800 Pure hypercholesterolemia, unspecified: Secondary | ICD-10-CM | POA: Diagnosis not present

## 2017-01-30 DIAGNOSIS — L210 Seborrhea capitis: Secondary | ICD-10-CM | POA: Diagnosis not present

## 2017-01-30 DIAGNOSIS — Z0000 Encounter for general adult medical examination without abnormal findings: Secondary | ICD-10-CM | POA: Diagnosis not present

## 2017-01-30 DIAGNOSIS — M549 Dorsalgia, unspecified: Secondary | ICD-10-CM | POA: Insufficient documentation

## 2017-01-30 DIAGNOSIS — G8929 Other chronic pain: Secondary | ICD-10-CM | POA: Diagnosis not present

## 2017-01-30 MED ORDER — KETOCONAZOLE 2 % EX SHAM: 1.0000 "application " | MEDICATED_SHAMPOO | CUTANEOUS | 11 refills | Status: DC

## 2017-01-30 NOTE — Assessment & Plan Note (Signed)
Check lipids 

## 2017-01-30 NOTE — Progress Notes (Signed)
Subjective:    Patient ID: Stacy [name], female    DOB: June 15, 1991, 26 y.o.   MRN: [id number]  HPI She is here for a physical exam.   Middle to upper back pain:  Last weekend was the worse the pain she had.  She has had some middle back pain for a while, but it is intermittent.   There is obvious cause.  She typically takes advil and rests and that helps.    She has no concerns.  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  Medications and allergies reviewed with patient and updated if appropriate.  Patient Active Problem List   Diagnosis Date Noted  . Acute upper respiratory infection 07/05/2016  . Hyperlipidemia 01/23/2016  . Dandruff 01/23/2016  . Iron deficiency anemia 01/23/2016    Current Outpatient Prescriptions on File Prior to Visit  Medication Sig Dispense Refill  . albuterol (PROVENTIL HFA;VENTOLIN HFA) 108 (90 Base) MCG/ACT inhaler Inhale 2 puffs into the lungs every 4 (four) hours as needed for wheezing or shortness of breath (cough, shortness of breath or wheezing.). 1 Inhaler 1  . chlorpheniramine-HYDROcodone (TUSSIONEX PENNKINETIC ER) 10-8 MG/5ML SUER Take 5 mLs by mouth at bedtime as needed. 115 mL 0  . ferrous sulfate 325 (65 FE) MG EC tablet Take 325 mg by mouth daily.     . fluticasone (FLONASE) 50 MCG/ACT nasal spray Place 2 sprays into both nostrils daily. 16 g 2  . ibuprofen (ADVIL,MOTRIN) 600 MG tablet Take 1 tablet (600 mg total) by mouth every 8 (eight) hours as needed. 30 tablet 0  . ketoconazole (NIZORAL) 2 % shampoo Apply 1 application topically 2 (two) times a week. 120 mL 11   No current facility-administered medications on file prior to visit.     Past Medical History:  Diagnosis Date  . Anemia   . Hyperlipidemia     Past Surgical History:  Procedure Laterality Date  . WISDOM TOOTH EXTRACTION      Social History   Social History  . Marital status: Single    Spouse name: N/A  . Number of  children: N/A  . Years of education: N/A   Social History Main Topics  . Smoking status: Never Smoker  . Smokeless tobacco: Never Used  . Alcohol use Yes  . Drug use: No  . Sexual activity: Not on file   Other Topics Concern  . Not on file   Social History Narrative  . No narrative on file    Family History  Problem Relation Age of Onset  . Diabetes Mother   . Autoimmune disease Mother   . Hyperlipidemia Father   . Cancer Maternal Grandmother     breast  . Cancer Paternal Grandmother     breast  . Heart disease Paternal Grandmother     Review of Systems  Constitutional: Positive for diaphoresis. Negative for chills and fever.  Eyes: Negative for visual disturbance.  Respiratory: Negative for cough, shortness of breath and wheezing.   Cardiovascular: Negative for chest pain, palpitations and leg swelling.  Gastrointestinal: Negative for abdominal pain, blood in stool, constipation, diarrhea and nausea.       Rare gerd  Genitourinary: Negative for dysuria and hematuria.  Musculoskeletal: Positive for back pain. Negative for arthralgias.  Skin: Negative for color change and rash.  Neurological: Negative for light-headedness and headaches.  Psychiatric/Behavioral: Negative for dysphoric mood. The patient is not nervous/anxious.  Objective:   Vitals:   01/30/17 1514  BP: 124/76  Pulse: (!) 105  Resp: 16  Temp: 98.3 F (36.8 C)   Filed Weights   01/30/17 1514  Weight: 125 lb (56.7 kg)   Body mass index is 21.46 kg/m.  Wt Readings from Last 3 Encounters:  01/30/17 125 lb (56.7 kg)  07/05/16 128 lb (58.1 kg)  01/23/16 121 lb (54.9 kg)     Physical Exam Constitutional: She appears well-developed and well-nourished. No distress.  HENT:  Head: Normocephalic and atraumatic.  Right Ear: External ear normal. Normal ear canal and TM Left Ear: External ear normal.  Normal ear canal and TM Mouth/Throat: Oropharynx is clear and moist.  Eyes: Conjunctivae  and EOM are normal.  Neck: Neck supple. No tracheal deviation present. No thyromegaly present.  No carotid bruit  Cardiovascular: Normal rate, regular rhythm and normal heart sounds.   No murmur heard.  No edema. Pulmonary/Chest: Effort normal and breath sounds normal. No respiratory distress. She has no wheezes. She has no rales.  Breast: deferred to Gyn Abdominal: Soft. She exhibits no distension. There is no tenderness.  Lymphadenopathy: She has no cervical adenopathy.  Skin: Skin is warm and dry. She is not diaphoretic.  Psychiatric: She has a normal mood and affect. Her behavior is normal.         Assessment & Plan:   Physical exam: Screening blood work ordered Immunizations  Up to date Gyn  scheduled Exercise none  -  encouraged regular exercise Weight normal BMI  Skin  No concerns Substance abuse   none  See Problem List for Assessment and Plan of chronic medical problems.  Follow-up annually if she is still in the area

## 2017-01-30 NOTE — Assessment & Plan Note (Signed)
Acute on chronic We'll refer to Dr. Katrinka Blazing for further evaluation

## 2017-01-30 NOTE — Patient Instructions (Addendum)
Test(s) ordered today. Your results will be released to MyChart (or called to you) after review, usually within 72hours after test completion. If any changes need to be made, you will be notified at that same time.  All other Health Maintenance issues reviewed.   All recommended immunizations and age-appropriate screenings are up-to-date or discussed.  No immunizations administered today.   Medications reviewed and updated.  No changes recommended at this time.  Your prescription(s) have been submitted to your pharmacy. Please take as directed and contact our office if you believe you are having problem(s) with the medication(s).   Please followup in one year   Health Maintenance, Female Adopting a healthy lifestyle and getting preventive care can go a long way to promote health and wellness. Talk with your health care provider about what schedule of regular examinations is right for you. This is a good chance for you to check in with your provider about disease prevention and staying healthy. In between checkups, there are plenty of things you can do on your own. Experts have done a lot of research about which lifestyle changes and preventive measures are most likely to keep you healthy. Ask your health care provider for more information. Weight and diet Eat a healthy diet  Be sure to include plenty of vegetables, fruits, low-fat dairy products, and lean protein.  Do not eat a lot of foods high in solid fats, added sugars, or salt.  Get regular exercise. This is one of the most important things you can do for your health.  Most adults should exercise for at least 150 minutes each week. The exercise should increase your heart rate and make you sweat (moderate-intensity exercise).  Most adults should also do strengthening exercises at least twice a week. This is in addition to the moderate-intensity exercise. Maintain a healthy weight  Body mass index (BMI) is a measurement that can  be used to identify possible weight problems. It estimates body fat based on height and weight. Your health care provider can help determine your BMI and help you achieve or maintain a healthy weight.  For females 20 years of age and older:  A BMI below 18.5 is considered underweight.  A BMI of 18.5 to 24.9 is normal.  A BMI of 25 to 29.9 is considered overweight.  A BMI of 30 and above is considered obese. Watch levels of cholesterol and blood lipids  You should start having your blood tested for lipids and cholesterol at 26 years of age, then have this test every 5 years.  You may need to have your cholesterol levels checked more often if:  Your lipid or cholesterol levels are high.  You are older than 26 years of age.  You are at high risk for heart disease. Cancer screening Lung Cancer  Lung cancer screening is recommended for adults 55-80 years old who are at high risk for lung cancer because of a history of smoking.  A yearly low-dose CT scan of the lungs is recommended for people who:  Currently smoke.  Have quit within the past 15 years.  Have at least a 30-pack-year history of smoking. A pack year is smoking an average of one pack of cigarettes a day for 1 year.  Yearly screening should continue until it has been 15 years since you quit.  Yearly screening should stop if you develop a health problem that would prevent you from having lung cancer treatment. Breast Cancer  Practice breast self-awareness. This means understanding how   your breasts normally appear and feel.  It also means doing regular breast self-exams. Let your health care provider know about any changes, no matter how small.  If you are in your 20s or 30s, you should have a clinical breast exam (CBE) by a health care provider every 1-3 years as part of a regular health exam.  If you are 42 or older, have a CBE every year. Also consider having a breast X-ray (mammogram) every year.  If you have a  family history of breast cancer, talk to your health care provider about genetic screening.  If you are at high risk for breast cancer, talk to your health care provider about having an MRI and a mammogram every year.  Breast cancer gene (BRCA) assessment is recommended for women who have family members with BRCA-related cancers. BRCA-related cancers include:  Breast.  Ovarian.  Tubal.  Peritoneal cancers.  Results of the assessment will determine the need for genetic counseling and BRCA1 and BRCA2 testing. Cervical Cancer  Your health care provider may recommend that you be screened regularly for cancer of the pelvic organs (ovaries, uterus, and vagina). This screening involves a pelvic examination, including checking for microscopic changes to the surface of your cervix (Pap test). You may be encouraged to have this screening done every 3 years, beginning at age 41.  For women ages 29-65, health care providers may recommend pelvic exams and Pap testing every 3 years, or they may recommend the Pap and pelvic exam, combined with testing for human papilloma virus (HPV), every 5 years. Some types of HPV increase your risk of cervical cancer. Testing for HPV may also be done on women of any age with unclear Pap test results.  Other health care providers may not recommend any screening for nonpregnant women who are considered low risk for pelvic cancer and who do not have symptoms. Ask your health care provider if a screening pelvic exam is right for you.  If you have had past treatment for cervical cancer or a condition that could lead to cancer, you need Pap tests and screening for cancer for at least 20 years after your treatment. If Pap tests have been discontinued, your risk factors (such as having a new sexual partner) need to be reassessed to determine if screening should resume. Some women have medical problems that increase the chance of getting cervical cancer. In these cases, your health  care provider may recommend more frequent screening and Pap tests. Colorectal Cancer  This type of cancer can be detected and often prevented.  Routine colorectal cancer screening usually begins at 26 years of age and continues through 26 years of age.  Your health care provider may recommend screening at an earlier age if you have risk factors for colon cancer.  Your health care provider may also recommend using home test kits to check for hidden blood in the stool.  A small camera at the end of a tube can be used to examine your colon directly (sigmoidoscopy or colonoscopy). This is done to check for the earliest forms of colorectal cancer.  Routine screening usually begins at age 80.  Direct examination of the colon should be repeated every 5-10 years through 26 years of age. However, you may need to be screened more often if early forms of precancerous polyps or small growths are found. Skin Cancer  Check your skin from head to toe regularly.  Tell your health care provider about any new moles or changes in moles,  especially if there is a change in a mole's shape or color.  Also tell your health care provider if you have a mole that is larger than the size of a pencil eraser.  Always use sunscreen. Apply sunscreen liberally and repeatedly throughout the day.  Protect yourself by wearing long sleeves, pants, a wide-brimmed hat, and sunglasses whenever you are outside. Heart disease, diabetes, and high blood pressure  High blood pressure causes heart disease and increases the risk of stroke. High blood pressure is more likely to develop in:  People who have blood pressure in the high end of the normal range (130-139/85-89 mm Hg).  People who are overweight or obese.  People who are African American.  If you are 57-39 years of age, have your blood pressure checked every 3-5 years. If you are 56 years of age or older, have your blood pressure checked every year. You should have  your blood pressure measured twice-once when you are at a hospital or clinic, and once when you are not at a hospital or clinic. Record the average of the two measurements. To check your blood pressure when you are not at a hospital or clinic, you can use:  An automated blood pressure machine at a pharmacy.  A home blood pressure monitor.  If you are between 6 years and 56 years old, ask your health care provider if you should take aspirin to prevent strokes.  Have regular diabetes screenings. This involves taking a blood sample to check your fasting blood sugar level.  If you are at a normal weight and have a low risk for diabetes, have this test once every three years after 26 years of age.  If you are overweight and have a high risk for diabetes, consider being tested at a younger age or more often. Preventing infection Hepatitis B  If you have a higher risk for hepatitis B, you should be screened for this virus. You are considered at high risk for hepatitis B if:  You were born in a country where hepatitis B is common. Ask your health care provider which countries are considered high risk.  Your parents were born in a high-risk country, and you have not been immunized against hepatitis B (hepatitis B vaccine).  You have HIV or AIDS.  You use needles to inject street drugs.  You live with someone who has hepatitis B.  You have had sex with someone who has hepatitis B.  You get hemodialysis treatment.  You take certain medicines for conditions, including cancer, organ transplantation, and autoimmune conditions. Hepatitis C  Blood testing is recommended for:  Everyone born from [birth year] through 1965.  Anyone with known risk factors for hepatitis C. Sexually transmitted infections (STIs)  You should be screened for sexually transmitted infections (STIs) including gonorrhea and chlamydia if:  You are sexually active and are younger than 26 years of age.  You are older than  26 years of age and your health care provider tells you that you are at risk for this type of infection.  Your sexual activity has changed since you were last screened and you are at an increased risk for chlamydia or gonorrhea. Ask your health care provider if you are at risk.  If you do not have HIV, but are at risk, it may be recommended that you take a prescription medicine daily to prevent HIV infection. This is called pre-exposure prophylaxis (PrEP). You are considered at risk if:  You are sexually active and do  not regularly use condoms or know the HIV status of your partner(s).  You take drugs by injection.  You are sexually active with a partner who has HIV. Talk with your health care provider about whether you are at high risk of being infected with HIV. If you choose to begin PrEP, you should first be tested for HIV. You should then be tested every 3 months for as long as you are taking PrEP. Pregnancy  If you are premenopausal and you may become pregnant, ask your health care provider about preconception counseling.  If you may become pregnant, take 400 to 800 micrograms (mcg) of folic acid every day.  If you want to prevent pregnancy, talk to your health care provider about birth control (contraception). Osteoporosis and menopause  Osteoporosis is a disease in which the bones lose minerals and strength with aging. This can result in serious bone fractures. Your risk for osteoporosis can be identified using a bone density scan.  If you are 10 years of age or older, or if you are at risk for osteoporosis and fractures, ask your health care provider if you should be screened.  Ask your health care provider whether you should take a calcium or vitamin D supplement to lower your risk for osteoporosis.  Menopause may have certain physical symptoms and risks.  Hormone replacement therapy may reduce some of these symptoms and risks. Talk to your health care provider about whether  hormone replacement therapy is right for you. Follow these instructions at home:  Schedule regular health, dental, and eye exams.  Stay current with your immunizations.  Do not use any tobacco products including cigarettes, chewing tobacco, or electronic cigarettes.  If you are pregnant, do not drink alcohol.  If you are breastfeeding, limit how much and how often you drink alcohol.  Limit alcohol intake to no more than 1 drink per day for nonpregnant women. One drink equals 12 ounces of beer, 5 ounces of wine, or 1 ounces of hard liquor.  Do not use street drugs.  Do not share needles.  Ask your health care provider for help if you need support or information about quitting drugs.  Tell your health care provider if you often feel depressed.  Tell your health care provider if you have ever been abused or do not feel safe at home. This information is not intended to replace advice given to you by your health care provider. Make sure you discuss any questions you have with your health care provider. Document Released: 04/16/2011 Document Revised: 03/08/2016 Document Reviewed: 07/05/2015 Elsevier Interactive Patient Education  2017 [employer].

## 2017-01-30 NOTE — Assessment & Plan Note (Signed)
Uses ketoconazole weekly

## 2017-01-30 NOTE — Progress Notes (Signed)
Pre visit review using our clinic review tool, if applicable. No additional management support is needed unless otherwise documented below in the visit note. 

## 2017-01-30 NOTE — Assessment & Plan Note (Signed)
Cbc, iron, ferritin 

## 2017-02-04 DIAGNOSIS — Z01419 Encounter for gynecological examination (general) (routine) without abnormal findings: Secondary | ICD-10-CM | POA: Diagnosis not present

## 2017-02-04 DIAGNOSIS — Z6821 Body mass index (BMI) 21.0-21.9, adult: Secondary | ICD-10-CM | POA: Diagnosis not present

## 2017-02-05 ENCOUNTER — Other Ambulatory Visit: Payer: Self-pay | Admitting: Obstetrics and Gynecology

## 2017-02-05 DIAGNOSIS — N6310 Unspecified lump in the right breast, unspecified quadrant: Principal | ICD-10-CM

## 2017-02-05 DIAGNOSIS — N6315 Unspecified lump in the right breast, overlapping quadrants: Secondary | ICD-10-CM

## 2017-02-13 ENCOUNTER — Other Ambulatory Visit: Payer: 59

## 2017-03-15 ENCOUNTER — Other Ambulatory Visit (INDEPENDENT_AMBULATORY_CARE_PROVIDER_SITE_OTHER): Payer: 59

## 2017-03-15 DIAGNOSIS — Z0000 Encounter for general adult medical examination without abnormal findings: Secondary | ICD-10-CM | POA: Diagnosis not present

## 2017-03-15 DIAGNOSIS — E7800 Pure hypercholesterolemia, unspecified: Secondary | ICD-10-CM

## 2017-03-15 DIAGNOSIS — D509 Iron deficiency anemia, unspecified: Secondary | ICD-10-CM

## 2017-03-15 LAB — CBC WITH DIFFERENTIAL/PLATELET
BASOS PCT: 2 % (ref 0.0–3.0)
Basophils Absolute: 0.1 10*3/uL (ref 0.0–0.1)
EOS ABS: 0.2 10*3/uL (ref 0.0–0.7)
Eosinophils Relative: 5.1 % — ABNORMAL HIGH (ref 0.0–5.0)
HCT: 37.6 % (ref 36.0–46.0)
Hemoglobin: 12.6 g/dL (ref 12.0–15.0)
Lymphocytes Relative: 45.8 % (ref 12.0–46.0)
Lymphs Abs: 1.6 10*3/uL (ref 0.7–4.0)
MCHC: 33.6 g/dL (ref 30.0–36.0)
MCV: 87.8 fl (ref 78.0–100.0)
Monocytes Absolute: 0.4 10*3/uL (ref 0.1–1.0)
Monocytes Relative: 11.8 % (ref 3.0–12.0)
Neutro Abs: 1.2 10*3/uL — ABNORMAL LOW (ref 1.4–7.7)
Neutrophils Relative %: 35.3 % — ABNORMAL LOW (ref 43.0–77.0)
PLATELETS: 304 10*3/uL (ref 150.0–400.0)
RBC: 4.28 Mil/uL (ref 3.87–5.11)
RDW: 13.3 % (ref 11.5–15.5)
WBC: 3.4 10*3/uL — AB (ref 4.0–10.5)

## 2017-03-15 LAB — LIPID PANEL
CHOLESTEROL: 224 mg/dL — AB (ref 0–200)
HDL: 84.4 mg/dL (ref >39.00)
LDL Cholesterol: 127 mg/dL — ABNORMAL HIGH (ref 0–99)
NonHDL: 139.98
Total CHOL/HDL Ratio: 3
Triglycerides: 66 mg/dL (ref 0.0–149.0)
VLDL: 13.2 mg/dL (ref 0.0–40.0)

## 2017-03-15 LAB — COMPREHENSIVE METABOLIC PANEL
ALBUMIN: 4.2 g/dL (ref 3.5–5.2)
ALT: 9 U/L (ref 0–35)
AST: 18 U/L (ref 0–37)
Alkaline Phosphatase: 37 U/L — ABNORMAL LOW (ref 39–117)
BUN: 8 mg/dL (ref 6–23)
CALCIUM: 9.7 mg/dL (ref 8.4–10.5)
CO2: 26 mEq/L (ref 19–32)
Chloride: 105 mEq/L (ref 96–112)
Creatinine, Ser: 0.77 mg/dL (ref 0.40–1.20)
GFR: 116.62 mL/min (ref >60.00)
Glucose, Bld: 84 mg/dL (ref 70–99)
POTASSIUM: 4.5 meq/L (ref 3.5–5.1)
Sodium: 138 mEq/L (ref 135–145)
TOTAL PROTEIN: 7.7 g/dL (ref 6.0–8.3)
Total Bilirubin: 0.4 mg/dL (ref 0.2–1.2)

## 2017-03-15 LAB — TSH: TSH: 0.83 u[IU]/mL (ref 0.35–4.50)

## 2017-03-15 LAB — FERRITIN: Ferritin: 8.7 ng/mL — ABNORMAL LOW (ref 10.0–291.0)

## 2017-03-15 LAB — IRON: IRON: 113 ug/dL (ref 42–145)

## 2017-03-16 ENCOUNTER — Encounter: Payer: Self-pay | Admitting: Internal Medicine

## 2017-03-20 ENCOUNTER — Ambulatory Visit
Admission: RE | Admit: 2017-03-20 | Discharge: 2017-03-20 | Disposition: A | Discharge status: Home / Self Care | Payer: 59 | Source: Ambulatory Visit | Attending: Obstetrics and Gynecology | Admitting: Obstetrics and Gynecology

## 2017-03-20 DIAGNOSIS — N6315 Unspecified lump in the right breast, overlapping quadrants: Secondary | ICD-10-CM

## 2017-03-20 DIAGNOSIS — N6489 Other specified disorders of breast: Secondary | ICD-10-CM | POA: Diagnosis not present

## 2017-03-20 DIAGNOSIS — N6310 Unspecified lump in the right breast, unspecified quadrant: Principal | ICD-10-CM

## 2017-03-23 NOTE — Progress Notes (Signed)
[name] [address] [place], [place] [address] Phone: [phone] Subjective:    I'm seeing this patient by the request  of:    CC: Thoracic back pain  [id number]  Stacy [name] is a 26 y.o. female coming in with complaint of thoracic back pain. Patient states that over the years has been some intermittent back pain. Patient describes it as a dull, throbbing aching pain. Patient denies any true injury. Usually takes over-the-counter medications such as Advil that was helpful but recently not so much. Patient rates the severity of pain overall as 4 out of 10. Patient still works out on a regular basis. Still seems comfortably. Denies any numbness. No radiation to any of the extremities. No abdominal pain.     Past Medical History:  Diagnosis Date  . Anemia   . Hyperlipidemia    Past Surgical History:  Procedure Laterality Date  . WISDOM TOOTH EXTRACTION     Social History   Social History  . Marital status: Single    Spouse name: N/A  . Number of children: N/A  . Years of education: N/A   Social History Main Topics  . Smoking status: Never Smoker  . Smokeless tobacco: Never Used  . Alcohol use Yes  . Drug use: No  . Sexual activity: Not Asked   Other Topics Concern  . None   Social History Narrative  . None   Allergies  Allergen Reactions  . Doxycycline Other (See Comments)    Decreased WBC, Neutropenia   Family History  Problem Relation Age of Onset  . Diabetes Mother   . Autoimmune disease Mother   . Hyperlipidemia Father   . Cancer Maternal Grandmother        breast  . Breast cancer Maternal Grandmother   . Cancer Paternal Grandmother        breast  . Heart disease Paternal Grandmother   . Breast cancer Paternal Grandmother     Past medical history, social, surgical and family history all reviewed in electronic medical record.  No pertanent information unless stated regarding to the chief complaint.   Review  of Systems:Review of systems updated and as accurate as of 03/25/17  No headache, visual changes, nausea, vomiting, diarrhea, constipation, dizziness, abdominal pain, skin rash, fevers, chills, night sweats, weight loss, swollen lymph nodes, body aches, joint swelling,  chest pain, shortness of breath, mood changes.  Mild positive muscle aches Objective  Blood pressure 104/72, pulse 98, height 5\' 4"  (1.626 m), weight 124 lb (56.2 kg), SpO2 98 %. Systems examined below as of 03/25/17   General: No apparent distress alert and oriented x3 mood and affect normal, dressed appropriately.  HEENT: Pupils equal, extraocular movements intact  Respiratory: Patient's speak in full sentences and does not appear short of breath  Cardiovascular: No lower extremity edema, non tender, no erythema  Skin: Warm dry intact with no signs of infection or rash on extremities or on axial skeleton.  Abdomen: Soft nontender  Neuro: Cranial nerves II through XII are intact, neurovascularly intact in all extremities with 2+ DTRs and 2+ pulses.  Lymph: No lymphadenopathy of posterior or anterior cervical chain or axillae bilaterally.  Gait normal with good balance and coordination.  MSK:  Non tender with full range of motion and good stability and symmetric strength and tone of shoulders, elbows, wrist, hip, knee and ankles bilaterally. Patient does have hypermobility beighton 7  Back Exam:  Inspection: Unremarkable  Motion: Flexion 45 deg,  Extension 25 deg, Side Bending to 45 deg bilaterally,  Rotation to 45 deg bilaterally  SLR laying: Negative  XSLR laying: Negative  Palpable tenderness: Mild tenderness to palpation in the thoracolumbar juncture as well as under the scapula bilaterally. FABER: negative. Sensory change: Gross sensation intact to all lumbar and sacral dermatomes.  Reflexes: 2+ at both patellar tendons, 2+ at achilles tendons, Babinski's downgoing.  Strength at foot  Plantar-flexion: 5/5  Dorsi-flexion: 5/5 Eversion: 5/5 Inversion: 5/5  Leg strength  Quad: 5/5 Hamstring: 5/5 Hip flexor: 5/5 Hip abductors: 4/5 but symmetric Gait unremarkable.  Osteopathic findings C7 flexed rotated and side bent left T3 extended rotated and side bent right inhaled third rib T6 extended rotated and side bent left L2 flexed rotated and side bent right Sacrum right on right  Procedure note 97110; 15 minutes spent for Therapeutic exercises as stated in above notes.  This included exercises focusing on stretching, strengthening, with significant focus on eccentric aspects.  Exercises that included:  Basic scapular stabilization to include adduction and depression of scapula Scaption, focusing on proper movement and good control Rows with theraband    Proper technique shown and discussed handout in great detail with ATC.  All questions were discussed and answered.     Impression and Recommendations:     This case required medical decision making of moderate complexity.      Note: This dictation was prepared with Dragon dictation along with smaller phrase technology. Any transcriptional errors that result from this process are unintentional.

## 2017-03-25 ENCOUNTER — Encounter: Payer: Self-pay | Admitting: Family Medicine

## 2017-03-25 ENCOUNTER — Ambulatory Visit (INDEPENDENT_AMBULATORY_CARE_PROVIDER_SITE_OTHER): Payer: 59 | Admitting: Family Medicine

## 2017-03-25 DIAGNOSIS — M999 Biomechanical lesion, unspecified: Secondary | ICD-10-CM | POA: Insufficient documentation

## 2017-03-25 DIAGNOSIS — M546 Pain in thoracic spine: Secondary | ICD-10-CM | POA: Diagnosis not present

## 2017-03-25 DIAGNOSIS — M357 Hypermobility syndrome: Secondary | ICD-10-CM | POA: Insufficient documentation

## 2017-03-25 DIAGNOSIS — G8929 Other chronic pain: Secondary | ICD-10-CM | POA: Diagnosis not present

## 2017-03-25 NOTE — Patient Instructions (Addendum)
Good to see you.  Ice 20 minutes 2 times daily. Usually after activity and before bed. Exercises 3 times a week.  On wall with heels, butt shoulder and head touching for a goal of 5 minutes daily  Over the counter get  Iron 65mg  with 500mg  of vitamin C at least 3 times a week.  Vitamin D 2000 IU daily  Stay active.  If working out weight lifting would be good for you.  Good shoes with rigid bottom.  Dierdre HarnessKeen, Dansko, Merrell or New balance greater then 700 See me again in 4 weeks.

## 2017-03-25 NOTE — Assessment & Plan Note (Signed)
Decision today to treat with OMT was based on Physical Exam  After verbal consent patient was treated with HVLA, ME, FPR techniques in cervical, thoracic, lumbar and sacral areas  Patient tolerated the procedure well with improvement in symptoms  Patient given exercises, stretches and lifestyle modifications  See medications in patient instructions if given  Patient will follow up in 4 weeks 

## 2017-03-25 NOTE — Assessment & Plan Note (Signed)
Neck pain seems to be multifactorial. Patient does have more of a benign hypermobility syndrome is likely causing some instability and increasing pain. We discussed icing regimen and home exercises. We discussed which activities to do an which was to avoid. 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 Patient will follow-up with me again in 4 weeks.

## 2017-05-05 NOTE — Progress Notes (Signed)
Tawana ScaleZach Haley Fuerstenberg D.O. [facility] [address] [place], [place] [address] Phone: [phone] Subjective:    I'm seeing this patient by the request  of:    CC: Thoracic back pain f/u  BJY:[id number]:Subjective  Stacy [name] is a 26 y.o. female coming in with complaint of thoracic back pain. Started Doing home exercises. Patient did respond very well to manipulation. Patient states Doing relatively well. Discussed with patient at great length. Patient has been doing icing regimen. Was doing significantly better and then some mild increase in pain recently. Patient when out of the country for multiple weeks. States now she was not doing the exercises as regularly.     Past Medical History:  Diagnosis Date  . Anemia   . Hyperlipidemia    Past Surgical History:  Procedure Laterality Date  . WISDOM TOOTH EXTRACTION     Social History   Social History  . Marital status: Single    Spouse name: N/A  . Number of children: N/A  . Years of education: N/A   Social History Main Topics  . Smoking status: Never Smoker  . Smokeless tobacco: Never Used  . Alcohol use Yes  . Drug use: No  . Sexual activity: Not Asked   Other Topics Concern  . None   Social History Narrative  . None   Allergies  Allergen Reactions  . Doxycycline Other (See Comments)    Decreased WBC, Neutropenia   Family History  Problem Relation Age of Onset  . Diabetes Mother   . Autoimmune disease Mother   . Hyperlipidemia Father   . Cancer Maternal Grandmother        breast  . Breast cancer Maternal Grandmother   . Cancer Paternal Grandmother        breast  . Heart disease Paternal Grandmother   . Breast cancer Paternal Grandmother     Past medical history, social, surgical and family history all reviewed in electronic medical record.  No pertanent information unless stated regarding to the chief complaint.   Review of Systems: No headache, visual changes, nausea, vomiting, diarrhea, constipation,  dizziness, abdominal pain, skin rash, fevers, chills, night sweats, weight loss, swollen lymph nodes, body aches, joint swelling, muscle aches, chest pain, shortness of breath, mood changes.     Objective  Blood pressure 118/72, pulse (!) 102, height 5\' 4"  (1.626 m), weight 126 lb (57.2 kg), SpO2 97 %.   Systems examined below as of 05/06/17 General: NAD A&O x3 mood, affect normal  HEENT: Pupils equal, extraocular movements intact no nystagmus Respiratory: not short of breath at rest or with speaking Cardiovascular: No lower extremity edema, non tender Skin: Warm dry intact with no signs of infection or rash on extremities or on axial skeleton. Abdomen: Soft nontender, no masses Neuro: Cranial nerves  intact, neurovascularly intact in all extremities with 2+ DTRs and 2+ pulses. Lymph: No lymphadenopathy appreciated today  Gait normal with good balance and coordination.  MSK: Non tender with full range of motion and good stability and symmetric strength and tone of shoulders, elbows, wrist,  knee hips and ankles bilaterally.   Patient does have hypermobility beighton 7  Back Exam:  Inspection: Unremarkable  Motion: Flexion 40 deg, Extension 25 deg, Side Bending to 40 deg bilaterally,  Rotation to 45 deg bilaterally  SLR laying: Negative  XSLR laying: Negative  Palpable tenderness: Minimal discomfort.[name]. FABER: negative. Sensory change: Gross sensation intact to all lumbar and sacral dermatomes.  Reflexes: 2+ at both patellar tendons,  2+ at achilles tendons, Babinski's downgoing.  Strength at foot  Plantar-flexion: 5/5 Dorsi-flexion: 5/5 Eversion: 5/5 Inversion: 5/5  Leg strength  Quad: 5/5 Hamstring: 5/5 Hip flexor: 5/5 Hip abductors: 4/5 but symmetric Gait unremarkable.  Osteopathic findings C2 flexed rotated and side bent right C4 flexed rotated and side bent left C7 flexed rotated and side bent left T3 extended rotated and side bent right inhaled third rib T12 extended rotated  and side bent left L3 flexed rotated and side bent right Sacrum left on left    Impression and Recommendations:     This case required medical decision making of moderate complexity.      Note: This dictation was prepared with Dragon dictation along with smaller phrase technology. Any transcriptional errors that result from this process are unintentional.

## 2017-05-06 ENCOUNTER — Encounter: Payer: Self-pay | Admitting: Family Medicine

## 2017-05-06 ENCOUNTER — Ambulatory Visit (INDEPENDENT_AMBULATORY_CARE_PROVIDER_SITE_OTHER): Payer: 59 | Admitting: Family Medicine

## 2017-05-06 VITALS — BP 118/72 | HR 102 | Ht 64.0 in | Wt 126.0 lb

## 2017-05-06 DIAGNOSIS — G8929 Other chronic pain: Secondary | ICD-10-CM

## 2017-05-06 DIAGNOSIS — M546 Pain in thoracic spine: Secondary | ICD-10-CM

## 2017-05-06 DIAGNOSIS — M999 Biomechanical lesion, unspecified: Secondary | ICD-10-CM

## 2017-05-06 NOTE — Assessment & Plan Note (Signed)
Making improvement at this time. We discussed on multiple services muscle imbalances. We discussed icing regimen and home exercises. We discussed objective recently do a which ones to avoid. Patient will increase activity as tolerated. Does respond well to manipulation. Follow-up again in 2-3 months.

## 2017-05-06 NOTE — Assessment & Plan Note (Signed)
Decision today to treat with OMT was based on Physical Exam  After verbal consent patient was treated with HVLA, ME, FPR techniques in cervical, thoracic, lumbar and sacral areas  Patient tolerated the procedure well with improvement in symptoms  Patient given exercises, stretches and lifestyle modifications  See medications in patient instructions if given  Patient will follow up in 8-10 weeks 

## 2017-05-06 NOTE — Patient Instructions (Addendum)
Sorry for running a little late You are doing great  Lets keep it up and focus on the posture and ergonomics Continue the vitamins  See me again in 8-10 weeks

## 2017-05-20 DIAGNOSIS — S60221A Contusion of right hand, initial encounter: Secondary | ICD-10-CM | POA: Diagnosis not present

## 2017-05-21 MED FILL — DICLOFENAC SOD EC 50 MG TAB: 50 | 30 days supply | Qty: 60 | Fill #0

## 2017-07-02 NOTE — Progress Notes (Signed)
[name] [address] [place], [place] [address] Phone: [phone] Subjective:    I'm seeing this patient by the request  of:    CC: Back pain follow-up  [id number]  Stacy [name] is a 26 y.o. female coming in with complaint of back pain follow-up. Patient does have benign hypermobility syndrome. Given home exercises, icing regimen, as well as trying to reverse patient's iron deficiency anemia. Patient has responded fairly well to osteopathic manipulation. Patient states that the last 3 days her back has been hurting a lot. Otherwise patient has been doing very well. Minimal discomfort overall. Not working on a regular basis.      Past Medical History:  Diagnosis Date  . Anemia   . Hyperlipidemia    Past Surgical History:  Procedure Laterality Date  . WISDOM TOOTH EXTRACTION     Social History   Social History  . Marital status: Single    Spouse name: N/A  . Number of children: N/A  . Years of education: N/A   Social History Main Topics  . Smoking status: Never Smoker  . Smokeless tobacco: Never Used  . Alcohol use Yes  . Drug use: No  . Sexual activity: Not Asked   Other Topics Concern  . None   Social History Narrative  . None   Allergies  Allergen Reactions  . Doxycycline Other (See Comments)    Decreased WBC, Neutropenia   Family History  Problem Relation Age of Onset  . Diabetes Mother   . Autoimmune disease Mother   . Hyperlipidemia Father   . Cancer Maternal Grandmother        breast  . Breast cancer Maternal Grandmother   . Cancer Paternal Grandmother        breast  . Heart disease Paternal Grandmother   . Breast cancer Paternal Grandmother      Past medical history, social, surgical and family history all reviewed in electronic medical record.  No pertanent information unless stated regarding to the chief complaint.   Review of Systems:Review of systems updated and as accurate as of 07/03/17  No  headache, visual changes, nausea, vomiting, diarrhea, constipation, dizziness, abdominal pain, skin rash, fevers, chills, night sweats, weight loss, swollen lymph nodes, body aches, joint swelling,  chest pain, shortness of breath, mood changes. Positive muscle aches  Objective  Blood pressure 120/70, pulse (!) 107, height  (1.626 m), weight 125 lb (56.7 kg), SpO2 98 %. Systems examined below as of 07/03/17   General: No apparent distress alert and oriented x3 mood and affect normal, dressed appropriately.  HEENT: Pupils equal, extraocular movements intact  Respiratory: Patient's speak in full sentences and does not appear short of breath  Cardiovascular: No lower extremity edema, non tender, no erythema  Skin: Warm dry intact with no signs of infection or rash on extremities or on axial skeleton.  Abdomen: Soft nontender  Neuro: Cranial nerves II through XII are intact, neurovascularly intact in all extremities with 2+ DTRs and 2+ pulses.  Lymph: No lymphadenopathy of posterior or anterior cervical chain or axillae bilaterally.  Gait normal with good balance and coordination.  MSK:  Non tender with full range of motion and good stability and symmetric strength and tone of shoulders, elbows, wrist, hip, knee and ankles bilaterally.  Back Exam:  Inspection: Unremarkable  Motion: Flexion 45 deg, Extension 45 deg, Side Bending to 45 deg bilaterally,  Rotation to 45 deg bilaterally  SLR laying: Negative  XSLR laying:  Negative  Palpable tenderness: None. FABER: negative. Sensory change: Gross sensation intact to all lumbar and sacral dermatomes.  Reflexes: 2+ at both patellar tendons, 2+ at achilles tendons, Babinski's downgoing.  Strength at foot  Plantar-flexion: 5/5 Dorsi-flexion: 5/5 Eversion: 5/5 Inversion: 5/5  Leg strength  Quad: 5/5 Hamstring: 5/5 Hip flexor: 5/5 Hip abductors: 5/5  Gait unremarkable.  Osteopathic findings C2 flexed rotated and side bent right C4 flexed  rotated and side bent left C6 flexed rotated and side bent left T3 extended rotated and side bent right inhaled third rib T9 extended rotated and side bent left L2 flexed rotated and side bent right L4 flexed rotated and side bent left  Sacrum right on right     Impression and Recommendations:     This case required medical decision making of moderate complexity.      Note: This dictation was prepared with Dragon dictation along with smaller phrase technology. Any transcriptional errors that result from this process are unintentional.

## 2017-07-03 ENCOUNTER — Encounter: Payer: Self-pay | Admitting: Family Medicine

## 2017-07-03 ENCOUNTER — Ambulatory Visit (INDEPENDENT_AMBULATORY_CARE_PROVIDER_SITE_OTHER): Payer: 59 | Admitting: Family Medicine

## 2017-07-03 VITALS — BP 120/70 | HR 107 | Ht 64.0 in | Wt 125.0 lb

## 2017-07-03 DIAGNOSIS — M546 Pain in thoracic spine: Secondary | ICD-10-CM | POA: Diagnosis not present

## 2017-07-03 DIAGNOSIS — M999 Biomechanical lesion, unspecified: Secondary | ICD-10-CM | POA: Diagnosis not present

## 2017-07-03 DIAGNOSIS — G8929 Other chronic pain: Secondary | ICD-10-CM | POA: Diagnosis not present

## 2017-07-03 NOTE — Patient Instructions (Signed)
Good to see you  Pay attention to the knee.  Ice your friend.  Keep doing the exercises for the back  pennsaid pinkie amount topically 2 times daily as needed.   See me again in 7 weeks.

## 2017-07-03 NOTE — Assessment & Plan Note (Signed)
Decision today to treat with OMT was based on Physical Exam  After verbal consent patient was treated with HVLA, ME, FPR techniques in cervical, thoracic, lumbar and sacral areas  Patient tolerated the procedure well with improvement in symptoms  Patient given exercises, stretches and lifestyle modifications  See medications in patient instructions if given  Patient will follow up in 7 weeks 

## 2017-07-03 NOTE — Assessment & Plan Note (Signed)
Back pain seems to be more around the sacroiliac joint. Discussed with patient again at great length about conservative therapy including home exercises and the importance of core strength. Patient will come back and see me in 7 weeks since 8 weeks. Some discussed icing regimen. Worsening symptoms to come back sooner.

## 2017-08-20 NOTE — Progress Notes (Signed)
[facility]awana ScaleZach Raiford Fetterman D.O. [facility] [address] [place], [place] [address] Phone: [phone] Subjective:    I'm seeing this patient by the request  of:    CC: Back pain follow-up  [id number]:Subjective  Stacy [name] is a 26 y.o. female coming in with complaint of back pain.  Seen previously with benign hypermobility syndrome.  Has responded very well to osteopathic manipulation and vitamin D supplementation.  Patient states overall she has been doing relatively well.  Has some increasing neck pain recently.  Patient does not know what possibly caused that to come on.  Working out as much.  Not doing the exercises regularly.      Past Medical History:  Diagnosis Date  . Anemia   . Hyperlipidemia    Past Surgical History:  Procedure Laterality Date  . WISDOM TOOTH EXTRACTION     Social History   Socioeconomic History  . Marital status: Single    Spouse name: None  . Number of children: None  . Years of education: None  . Highest education level: None  Social Needs  . Financial resource strain: None  . Food insecurity - worry: None  . Food insecurity - inability: None  . Transportation needs - medical: None  . Transportation needs - non-medical: None  Occupational History  . None  Tobacco Use  . Smoking status: Never Smoker  . Smokeless tobacco: Never Used  Substance and Sexual Activity  . Alcohol use: Yes  . Drug use: No  . Sexual activity: None  Other Topics Concern  . None  Social History Narrative  . None   Allergies  Allergen Reactions  . Doxycycline Other (See Comments)    Decreased WBC, Neutropenia   Family History  Problem Relation Age of Onset  . Diabetes Mother   . Autoimmune disease Mother   . Hyperlipidemia Father   . Cancer Maternal Grandmother        breast  . Breast cancer Maternal Grandmother   . Cancer Paternal Grandmother        breast  . Heart disease Paternal Grandmother   . Breast cancer Paternal Grandmother      Past  medical history, social, surgical and family history all reviewed in electronic medical record.  No pertanent information unless stated regarding to the chief complaint.   Review of Systems:Review of systems updated and as accurate as of 08/21/17  No headache, visual changes, nausea, vomiting, diarrhea, constipation, dizziness, abdominal pain, skin rash, fevers, chills, night sweats, weight loss, swollen lymph nodes, body aches, joint swelling, muscle aches, chest pain, shortness of breath, mood changes.   Objective  Blood pressure 100/80, pulse 91, height 5\' 4"  (1.626 m), weight 126 lb (57.2 kg), SpO2 98 %. Systems examined below as of 08/21/17   General: No apparent distress alert and oriented x3 mood and affect normal, dressed appropriately.  HEENT: Pupils equal, extraocular movements intact  Respiratory: Patient's speak in full sentences and does not appear short of breath  Cardiovascular: No lower extremity edema, non tender, no erythema  Skin: Warm dry intact with no signs of infection or rash on extremities or on axial skeleton.  Abdomen: Soft nontender  Neuro: Cranial nerves II through XII are intact, neurovascularly intact in all extremities with 2+ DTRs and 2+ pulses.  Lymph: No lymphadenopathy of posterior or anterior cervical chain or axillae bilaterally.  Gait normal with good balance and coordination.  MSK:  Non tender with full range of motion and good stability and symmetric  strength and tone of shoulders, elbows, wrist, hip, knee and ankles bilaterally.  Neck: Inspection mild increase in lordosis. No palpable stepoffs. Negative Spurling's maneuver. Full neck range of motion Grip strength and sensation normal in bilateral hands Strength good C4 to T1 distribution No sensory change to C4 to T1 Negative Hoffman sign bilaterally Reflexes normal  Back Exam:  Inspection: Unremarkable  Motion: Flexion 40 deg, Extension 25 deg, Side Bending to 35 deg bilaterally,  Rotation  to 45 deg bilaterally  SLR laying: Negative  XSLR laying: Negative  Palpable tenderness: Tender to palpation in the paraspinal musculature mostly in the thoracolumbar juncture this time.[name]. FABER: negative. Sensory change: Gross sensation intact to all lumbar and sacral dermatomes.  Reflexes: 2+ at both patellar tendons, 2+ at achilles tendons, Babinski's downgoing.  Strength at foot  Plantar-flexion: 5/5 Dorsi-flexion: 5/5 Eversion: 5/5 Inversion: 5/5  Leg strength  Quad: 5/5 Hamstring: 5/5 Hip flexor: 5/5 Hip abductors: 4/5 but symmetric Gait unremarkable.   Osteopathic findings C2 flexed rotated and side bent right C4 flexed rotated and side bent left C7 flexed rotated and side bent left T3 extended rotated and side bent right inhaled third rib T8 extended rotated and side bent left L2 flexed rotated and side bent right Sacrum right on right    Impression and Recommendations:     This case required medical decision making of moderate complexity.      Note: This dictation was prepared with Dragon dictation along with smaller phrase technology. Any transcriptional errors that result from this process are unintentional.

## 2017-08-21 ENCOUNTER — Encounter: Payer: Self-pay | Admitting: Family Medicine

## 2017-08-21 ENCOUNTER — Ambulatory Visit (INDEPENDENT_AMBULATORY_CARE_PROVIDER_SITE_OTHER): Payer: 59 | Admitting: Family Medicine

## 2017-08-21 VITALS — BP 100/80 | HR 91 | Ht 64.0 in | Wt 126.0 lb

## 2017-08-21 DIAGNOSIS — M546 Pain in thoracic spine: Secondary | ICD-10-CM

## 2017-08-21 DIAGNOSIS — M999 Biomechanical lesion, unspecified: Secondary | ICD-10-CM | POA: Diagnosis not present

## 2017-08-21 DIAGNOSIS — G8929 Other chronic pain: Secondary | ICD-10-CM

## 2017-08-21 NOTE — Patient Instructions (Signed)
Good to see you  You are doing great  On wall with heels, butt shoulder and head touching for a goal of 5 minutes daily  2 tennis ball in a tube sock and lay on them where head meets neck  Stay active See em again in 6-8 weeks!

## 2017-08-21 NOTE — Assessment & Plan Note (Signed)
Decision today to treat with OMT was based on Physical Exam  After verbal consent patient was treated with HVLA, ME, FPR techniques in cervical, thoracic, lumbar and sacral areas  Patient tolerated the procedure well with improvement in symptoms  Patient given exercises, stretches and lifestyle modifications  See medications in patient instructions if given  Patient will follow up in 6-8 weeks 

## 2017-08-21 NOTE — Assessment & Plan Note (Signed)
Back pain seems to be stable.  We will monitor the neck pain.  Continue with the osteopathic manipulation.  Does have hypermobility we discussed more weight training than true rotation and range of motion.  Follow-up again 6-8 weeks.

## 2017-09-21 ENCOUNTER — Ambulatory Visit (INDEPENDENT_AMBULATORY_CARE_PROVIDER_SITE_OTHER): Payer: 59 | Admitting: Internal Medicine

## 2017-09-21 ENCOUNTER — Encounter: Payer: Self-pay | Admitting: Internal Medicine

## 2017-09-21 VITALS — BP 118/80 | HR 102 | Temp 98.8°F | Wt 124.0 lb

## 2017-09-21 DIAGNOSIS — J069 Acute upper respiratory infection, unspecified: Secondary | ICD-10-CM | POA: Diagnosis not present

## 2017-09-21 DIAGNOSIS — J0110 Acute frontal sinusitis, unspecified: Secondary | ICD-10-CM

## 2017-09-21 MED ORDER — HYDROCODONE-HOMATROPINE 5-1.5 MG/5ML PO SYRP: 5.0000 mL | ORAL_SOLUTION | Freq: Every evening | ORAL | 0 refills | Status: DC | PRN

## 2017-09-21 MED ORDER — AMOXICILLIN 500 MG PO TABS: 1000.0000 mg | ORAL_TABLET | Freq: Two times a day (BID) | ORAL | 0 refills | Status: AC

## 2017-09-21 MED ORDER — FLUTICASONE PROPIONATE 50 MCG/ACT NA SUSP: 2.0000 | Freq: Every day | NASAL | 2 refills | Status: DC

## 2017-09-21 NOTE — Patient Instructions (Signed)
Please start the antibiotic (amoxicillin) if your symptoms are worsening over the next few days.

## 2017-09-21 NOTE — Assessment & Plan Note (Signed)
May still be viral but worsening drainage and cough Fill amoxil--start in a few days if worsening (snow storm coming) Cough syrup flonase

## 2017-09-21 NOTE — Progress Notes (Signed)
Subjective:    Patient ID: Stacy [name], female    DOB: 11/16/1990, 26 y.o.   MRN: [id number]  HPI Here due to respiratory symptoms  Started with sore throat 5 days ago Mild illness--just rested Worsened  Some trouble swallowing  Nasal drainage Productive cough--affecting sleep. Yellow green (same as from nose) Some right ear pressure  Warm earlier in week--then got cold Not since No night chills. Regularly sweats at night Voice is off  Using benedryl at night, mucinex D and claritin in day Delsym for cough No clear help Can't sleep  Current Outpatient Medications on File Prior to Visit  Medication Sig Dispense Refill  . albuterol (PROVENTIL HFA;VENTOLIN HFA) 108 (90 Base) MCG/ACT inhaler Inhale 2 puffs into the lungs every 4 (four) hours as needed for wheezing or shortness of breath (cough, shortness of breath or wheezing.). 1 Inhaler 1  . etonogestrel-ethinyl estradiol (NUVARING) 0.12-0.015 MG/24HR vaginal ring Place 1 each vaginally every 28 (twenty-eight) days. Insert vaginally and leave in place for 3 consecutive weeks, then remove for 1 week.    . ferrous sulfate 325 (65 FE) MG EC tablet Take 325 mg by mouth daily.     [name]. ibuprofen (ADVIL,MOTRIN) 600 MG tablet Take 1 tablet (600 mg total) by mouth every 8 (eight) hours as needed. 30 tablet 0  . ketoconazole (NIZORAL) 2 % shampoo Apply 1 application topically 2 (two) times a week. 120 mL 11  . fluticasone (FLONASE) 50 MCG/ACT nasal spray Place 2 sprays into both nostrils daily. (Patient not taking: Reported on 09/21/2017) 16 g 2   No current facility-administered medications on file prior to visit.     Allergies  Allergen Reactions  . Doxycycline Other (See Comments)    Decreased WBC, Neutropenia    Past Medical History:  Diagnosis Date  . Anemia   . Hyperlipidemia     Past Surgical History:  Procedure Laterality Date  . WISDOM TOOTH EXTRACTION      Family History  Problem Relation Age of Onset  .  Diabetes Mother   . Autoimmune disease Mother   . Hyperlipidemia Father   . Cancer Maternal Grandmother        breast  . Breast cancer Maternal Grandmother   . Cancer Paternal Grandmother        breast  . Heart disease Paternal Grandmother   . Breast cancer Paternal Grandmother     Social History   Socioeconomic History  . Marital status: Single    Spouse name: Not on file  . Number of children: Not on file  . Years of education: Not on file  . Highest education level: Not on file  Social Needs  . Financial resource strain: Not on file  . Food insecurity - worry: Not on file  . Food insecurity - inability: Not on file  . Transportation needs - medical: Not on file  . Transportation needs - non-medical: Not on file  Occupational History  . Not on file  Tobacco Use  . Smoking status: Never Smoker  . Smokeless tobacco: Never Used  Substance and Sexual Activity  . Alcohol use: Yes  . Drug use: No  . Sexual activity: Not on file  Other Topics Concern  . Not on file  Social History Narrative  . Not on file   Review of Systems Doesn't take allergy meds regularly Frontal and occipital headache No rash No vomiting Watery stools only in AM x 2 Appetite is okay    Objective:  Physical Exam  Constitutional: No distress.  HENT:  Mouth/Throat: Oropharynx is clear and moist. No oropharyngeal exudate.  Mild frontal tenderness TMs normal Marked nasal inflammation  Neck: No thyromegaly present.  Pulmonary/Chest: Effort normal and breath sounds normal. No respiratory distress. She has no wheezes. She has no rales.          Assessment & Plan:

## 2017-10-02 NOTE — Progress Notes (Signed)
Tawana ScaleZach Smith D.O. [facility] [address] [place], [place] [address] Phone: [phone] Subjective:    I'm seeing this patient by the request  of:     CC: Back pain and neck pain follow-up  LKG:[id number]:Subjective  Lisette Grinderlyson Stansbury is a 26 y.o. female coming in with complaint of back pain and neck pain.  Patient has been seen previously.  Diagnosed with benign hypermobility syndrome.  Doing well with the exercises, patient states that the back is not giving her as much discomfort and pain at this time.  Working out on a regular basis.  Continues to work with no significant discomfort though.  Sometimes just aware of her back more than she thinks she should be.     Past Medical History:  Diagnosis Date  . Anemia   . Hyperlipidemia    Past Surgical History:  Procedure Laterality Date  . WISDOM TOOTH EXTRACTION     Social History   Socioeconomic History  . Marital status: Single    Spouse name: None  . Number of children: None  . Years of education: None  . Highest education level: None  Social Needs  . Financial resource strain: None  . Food insecurity - worry: None  . Food insecurity - inability: None  . Transportation needs - medical: None  . Transportation needs - non-medical: None  Occupational History  . None  Tobacco Use  . Smoking status: Never Smoker  . Smokeless tobacco: Never Used  Substance and Sexual Activity  . Alcohol use: Yes  . Drug use: No  . Sexual activity: None  Other Topics Concern  . None  Social History Narrative  . None   Allergies  Allergen Reactions  . Doxycycline Other (See Comments)    Decreased WBC, Neutropenia   Family History  Problem Relation Age of Onset  . Diabetes Mother   . Autoimmune disease Mother   . Hyperlipidemia Father   . Cancer Maternal Grandmother        breast  . Breast cancer Maternal Grandmother   . Cancer Paternal Grandmother        breast  . Heart disease Paternal Grandmother   . Breast cancer  Paternal Grandmother      Past medical history, social, surgical and family history all reviewed in electronic medical record.  No pertanent information unless stated regarding to the chief complaint.   Review of Systems:Review of systems updated and as accurate as of 10/03/17  No headache, visual changes, nausea, vomiting, diarrhea, constipation, dizziness, abdominal pain, skin rash, fevers, chills, night sweats, weight loss, swollen lymph nodes, body aches, joint swelling, chest pain, shortness of breath, mood changes.  Positive muscle aches  Objective  Blood pressure 114/70, pulse 94, height 5\' 4"  (1.626 m), weight 128 lb (58.1 kg), last menstrual period 09/14/2017, SpO2 96 %. Systems examined below as of 10/03/17   General: No apparent distress alert and oriented x3 mood and affect normal, dressed appropriately.  HEENT: Pupils equal, extraocular movements intact  Respiratory: Patient's speak in full sentences and does not appear short of breath  Cardiovascular: No lower extremity edema, non tender, no erythema  Skin: Warm dry intact with no signs of infection or rash on extremities or on axial skeleton.  Abdomen: Soft nontender  Neuro: Cranial nerves II through XII are intact, neurovascularly intact in all extremities with 2+ DTRs and 2+ pulses.  Lymph: No lymphadenopathy of posterior or anterior cervical chain or axillae bilaterally.  Gait normal with good balance  and coordination.  MSK:  Non tender with full range of motion and good stability and symmetric strength and tone of shoulders, elbows, wrist, hip, knee and ankles bilaterally.  Back Exam:  Inspection: Unremarkable  Motion: Flexion 45 deg, Extension 25 deg, Side Bending to 45 deg bilaterally,  Rotation to 45 deg bilaterally  SLR laying: Negative  XSLR laying: Negative  Palpable tenderness: None. FABER: negative. Sensory change: Gross sensation intact to all lumbar and sacral dermatomes.  Reflexes: 2+ at both patellar  tendons, 2+ at achilles tendons, Babinski's downgoing.  Strength at foot  Plantar-flexion: 5/5 Dorsi-flexion: 5/5 Eversion: 5/5 Inversion: 5/5  Leg strength  Quad: 5/5 Hamstring: 5/5 Hip flexor: 5/5 Hip abductors: 5/5  Gait unremarkable.  Neck exam shows some mild tightness with range of motion.  Lordosis.  Otherwise unremarkable.  Osteopathic findings C2 flexed rotated and side bent right C4 flexed rotated and side bent left C7 flexed rotated and side bent left T3 extended rotated and side bent right inhaled third rib T5 extended rotated and side bent left L2 flexed rotated and side bent right Sacrum right on right    Impression and Recommendations:     This case required medical decision making of moderate complexity.      Note: This dictation was prepared with Dragon dictation along with smaller phrase technology. Any transcriptional errors that result from this process are unintentional.       ;l

## 2017-10-03 ENCOUNTER — Ambulatory Visit (INDEPENDENT_AMBULATORY_CARE_PROVIDER_SITE_OTHER): Payer: 59 | Admitting: Family Medicine

## 2017-10-03 ENCOUNTER — Encounter: Payer: Self-pay | Admitting: Family Medicine

## 2017-10-03 VITALS — BP 114/70 | HR 94 | Ht 64.0 in | Wt 128.0 lb

## 2017-10-03 DIAGNOSIS — M546 Pain in thoracic spine: Secondary | ICD-10-CM | POA: Diagnosis not present

## 2017-10-03 DIAGNOSIS — M999 Biomechanical lesion, unspecified: Secondary | ICD-10-CM | POA: Diagnosis not present

## 2017-10-03 DIAGNOSIS — G8929 Other chronic pain: Secondary | ICD-10-CM

## 2017-10-03 NOTE — Assessment & Plan Note (Signed)
Decision today to treat with OMT was based on Physical Exam  After verbal consent patient was treated with HVLA, ME, FPR techniques in cervical, thoracic, lumbar and sacral areas  Patient tolerated the procedure well with improvement in symptoms  Patient given exercises, stretches and lifestyle modifications  See medications in patient instructions if given  Patient will follow up in 8 weeks 

## 2017-10-03 NOTE — Assessment & Plan Note (Signed)
Stable overall.  No significant changes in management.  Can space up to 8-12 weeks.  Responded very well to osteopathic manipulation.

## 2017-10-03 NOTE — Patient Instructions (Signed)
You are awesome.  Keep it up! Posture is key and work on keeping shoulders back  I will see you again in 2 months! Happy New Year!

## 2017-10-29 ENCOUNTER — Encounter: Payer: Self-pay | Admitting: Internal Medicine

## 2017-10-29 DIAGNOSIS — Z124 Encounter for screening for malignant neoplasm of cervix: Secondary | ICD-10-CM

## 2017-10-29 DIAGNOSIS — M999 Biomechanical lesion, unspecified: Secondary | ICD-10-CM

## 2017-12-04 NOTE — Progress Notes (Signed)
Tawana ScaleZach Smith D.O. [facility] [address] [place], [place] [address] Phone: [phone] Subjective:     CC: Back and neck pain follow-up  BJY:NWGNFAOZHYHPI:Subjective  Stacy [name] is a 27 y.o. female coming in with complaint of back and neck pain.  Does have hypermobility syndrome and iron deficiency.  Has responded well to osteopathic manipulation.  Patient states that she has popping in the cervical spine. She has been having pain in the trap muscle which she tries to massage to alleviate her pain.     Past Medical History:  Diagnosis Date  . Anemia   . Hyperlipidemia    Past Surgical History:  Procedure Laterality Date  . WISDOM TOOTH EXTRACTION     Social History   Socioeconomic History  . Marital status: Single    Spouse name: None  . Number of children: None  . Years of education: None  . Highest education level: None  Social Needs  . Financial resource strain: None  . Food insecurity - worry: None  . Food insecurity - inability: None  . Transportation needs - medical: None  . Transportation needs - non-medical: None  Occupational History  . None  Tobacco Use  . Smoking status: Never Smoker  . Smokeless tobacco: Never Used  Substance and Sexual Activity  . Alcohol use: Yes  . Drug use: No  . Sexual activity: None  Other Topics Concern  . None  Social History Narrative  . None   Allergies  Allergen Reactions  . Doxycycline Other (See Comments)    Decreased WBC, Neutropenia   Family History  Problem Relation Age of Onset  . Diabetes Mother   . Autoimmune disease Mother   . Hyperlipidemia Father   . Cancer Maternal Grandmother        breast  . Breast cancer Maternal Grandmother   . Cancer Paternal Grandmother        breast  . Heart disease Paternal Grandmother   . Breast cancer Paternal Grandmother      Past medical history, social, surgical and family history all reviewed in electronic medical record.  No pertanent information unless stated  regarding to the chief complaint.   Review of Systems:Review of systems updated and as accurate as of 12/05/17  No headache, visual changes, nausea, vomiting, diarrhea, constipation, dizziness, abdominal pain, skin rash, fevers, chills, night sweats, weight loss, swollen lymph nodes, body aches, joint swelling, muscle aches, chest pain, shortness of breath, mood changes.   Objective  Blood pressure 112/78, pulse 94, height 5\' 4"  (1.626 m), weight 123 lb (55.8 kg), SpO2 98 %. Systems examined below as of 12/05/17   General: No apparent distress alert and oriented x3 mood and affect normal, dressed appropriately.  HEENT: Pupils equal, extraocular movements intact  Respiratory: Patient's speak in full sentences and does not appear short of breath  Cardiovascular: No lower extremity edema, non tender, no erythema  Skin: Warm dry intact with no signs of infection or rash on extremities or on axial skeleton.  Abdomen: Soft nontender  Neuro: Cranial nerves II through XII are intact, neurovascularly intact in all extremities with 2+ DTRs and 2+ pulses.  Lymph: No lymphadenopathy of posterior or anterior cervical chain or axillae bilaterally.  Gait normal with good balance and coordination.  MSK:  Non tender with full range of motion and good stability and symmetric strength and tone of shoulders, elbows, wrist, hip, knee and ankles bilaterally.  Hypermobility noted Neck: Inspection loss of lordosis. No palpable stepoffs.  Negative Spurling's maneuver. Mild limitation in range of motion of 5 degrees in all planes. Grip strength and sensation normal in bilateral hands Strength good C4 to T1 distribution No sensory change to C4 to T1 Negative Hoffman sign bilaterally Reflexes normal Tightness of the right trapezius  Osteopathic findings C2 flexed rotated and side bent right C4 flexed rotated and side bent left C7 flexed rotated and side bent left T3 extended rotated and side bent right inhaled  third rib T6 extended rotated and side bent left L2 flexed rotated and side bent right Sacrum right on right     Impression and Recommendations:     This case required medical decision making of moderate complexity.      Note: This dictation was prepared with Dragon dictation along with smaller phrase technology. Any transcriptional errors that result from this process are unintentional.

## 2017-12-05 ENCOUNTER — Encounter: Payer: Self-pay | Admitting: Family Medicine

## 2017-12-05 ENCOUNTER — Ambulatory Visit: Payer: 59 | Admitting: Family Medicine

## 2017-12-05 VITALS — BP 112/78 | HR 94 | Ht 64.0 in | Wt 123.0 lb

## 2017-12-05 DIAGNOSIS — M999 Biomechanical lesion, unspecified: Secondary | ICD-10-CM

## 2017-12-05 DIAGNOSIS — M357 Hypermobility syndrome: Secondary | ICD-10-CM | POA: Diagnosis not present

## 2017-12-05 NOTE — Patient Instructions (Signed)
Good to see you  Ice is your friend 2 tennis ball in a tube sock and lay on it wear head meets neck  Tennis ball on that area and take weight in hand and just breat  Keep hands within peripheral vision  See me again in 2 months

## 2017-12-05 NOTE — Assessment & Plan Note (Signed)
Decision today to treat with OMT was based on Physical Exam  After verbal consent patient was treated with HVLA, ME, FPR techniques in cervical, thoracic, lumbar and sacral areas  Patient tolerated the procedure well with improvement in symptoms  Patient given exercises, stretches and lifestyle modifications  See medications in patient instructions if given  Patient will follow up in 4-8 weeks 

## 2017-12-05 NOTE — Assessment & Plan Note (Signed)
Patient does have more of a hypermobility syndrome.  We discussed icing regimen and home exercises.  We discussed which activities to do which wants to avoid

## 2018-01-29 NOTE — Progress Notes (Signed)
[name] [address] [place], [place] [address] Phone: [phone] Subjective:     CC: Neck pain follow-up  BJY:NWGNFAOZHY  Stacy [name] is a 27 y.o. female coming in with complaint of neck pain. She has been doing well since last visit. Her pain remains the same as last visit.  Hypermobility syndrome.  Patient has been responding well to manipulation.  Feeling much better overall.  Minimal discomfort.  Has been increasing activity and not having as much pain.  Still has some discomfort in the neck but nothing severe.       Past Medical History:  Diagnosis Date  . Anemia   . Hyperlipidemia    Past Surgical History:  Procedure Laterality Date  . WISDOM TOOTH EXTRACTION     Social History   Socioeconomic History  . Marital status: Single    Spouse name: Not on file  . Number of children: Not on file  . Years of education: Not on file  . Highest education level: Not on file  Occupational History  . Not on file  Social Needs  . Financial resource strain: Not on file  . Food insecurity:    Worry: Not on file    Inability: Not on file  . Transportation needs:    Medical: Not on file    Non-medical: Not on file  Tobacco Use  . Smoking status: Never Smoker  . Smokeless tobacco: Never Used  Substance and Sexual Activity  . Alcohol use: Yes  . Drug use: No  . Sexual activity: Not on file  Lifestyle  . Physical activity:    Days per week: Not on file    Minutes per session: Not on file  . Stress: Not on file  Relationships  . Social connections:    Talks on phone: Not on file    Gets together: Not on file    Attends religious service: Not on file    Active member of club or organization: Not on file    Attends meetings of clubs or organizations: Not on file    Relationship status: Not on file  Other Topics Concern  . Not on file  Social History Narrative  . Not on file   Allergies  Allergen Reactions  . Doxycycline Other  (See Comments)    Decreased WBC, Neutropenia   Family History  Problem Relation Age of Onset  . Diabetes Mother   . Autoimmune disease Mother   . Hyperlipidemia Father   . Cancer Maternal Grandmother        breast  . Breast cancer Maternal Grandmother   . Cancer Paternal Grandmother        breast  . Heart disease Paternal Grandmother   . Breast cancer Paternal Grandmother      Past medical history, social, surgical and family history all reviewed in electronic medical record.  No pertanent information unless stated regarding to the chief complaint.   Review of Systems:Review of systems updated and as accurate as of 01/30/18  No headache, visual changes, nausea, vomiting, diarrhea, constipation, dizziness, abdominal pain, skin rash, fevers, chills, night sweats, weight loss, swollen lymph nodes, body aches, joint swelling,  chest pain, shortness of breath, mood changes.  Positive muscle aches  Objective  Blood pressure 110/78, pulse 87, height 5\' 4"  (1.626 m), weight 125 lb (56.7 kg), SpO2 98 %. Systems examined below as of 01/30/18   General: No apparent distress alert and oriented x3 mood and affect normal, dressed  appropriately.  HEENT: Pupils equal, extraocular movements intact  Respiratory: Patient's speak in full sentences and does not appear short of breath  Cardiovascular: No lower extremity edema, non tender, no erythema  Skin: Warm dry intact with no signs of infection or rash on extremities or on axial skeleton.  Abdomen: Soft nontender  Neuro: Cranial nerves II through XII are intact, neurovascularly intact in all extremities with 2+ DTRs and 2+ pulses.  Lymph: No lymphadenopathy of posterior or anterior cervical chain or axillae bilaterally.  Gait normal with good balance and coordination.  MSK:  Non tender with full range of motion and good stability and symmetric strength and tone of shoulders, elbows, wrist, hip, knee and ankles bilaterally.   Hypermobility Neck: Inspection loss of lordosis. No palpable stepoffs. Negative Spurling's maneuver. Mild loss of range of motion. Grip strength and sensation normal in bilateral hands Strength good C4 to T1 distribution No sensory change to C4 to T1 Negative Hoffman sign bilaterally Reflexes normal Tightness of the left trapezius.  C2 flexed rotated and side bent left C4 flexed rotated and side bent right C7 flexed rotated and side bent right T3 extended rotated and side bent left inhaled third rib T9 extended rotated and side bent left L2 flexed rotated and side bent right Sacrum left and left     Impression and Recommendations:     This case required medical decision making of moderate complexity.      Note: This dictation was prepared with Dragon dictation along with smaller phrase technology. Any transcriptional errors that result from this process are unintentional.

## 2018-01-30 ENCOUNTER — Ambulatory Visit: Payer: No Typology Code available for payment source | Admitting: Family Medicine

## 2018-01-30 ENCOUNTER — Encounter: Payer: Self-pay | Admitting: Family Medicine

## 2018-01-30 VITALS — BP 110/78 | HR 87 | Ht 64.0 in | Wt 125.0 lb

## 2018-01-30 DIAGNOSIS — G8929 Other chronic pain: Secondary | ICD-10-CM

## 2018-01-30 DIAGNOSIS — M999 Biomechanical lesion, unspecified: Secondary | ICD-10-CM | POA: Diagnosis not present

## 2018-01-30 DIAGNOSIS — M546 Pain in thoracic spine: Secondary | ICD-10-CM

## 2018-01-30 NOTE — Assessment & Plan Note (Signed)
Decision today to treat with OMT was based on Physical Exam  After verbal consent patient was treated with HVLA, ME, FPR techniques in cervical, thoracic, lumbar and sacral areas  Patient tolerated the procedure well with improvement in symptoms  Patient given exercises, stretches and lifestyle modifications  See medications in patient instructions if given  Patient will follow up in 12-16 weeks

## 2018-01-30 NOTE — Patient Instructions (Signed)
Great to see you  I am impressed  Keep doing the lat pulls See me again in 10-12 weeks

## 2018-01-30 NOTE — Assessment & Plan Note (Signed)
Doing well.  Well controlled.  Patient is working out on a regular basis.  Denies any numbness or tingling.  Happy with the results so far.  Follow-up again in 3-4 months

## 2018-02-09 NOTE — Progress Notes (Signed)
Subjective:    Patient ID: Stacy [name], female    DOB: 20-Mar-1991, 26 y.o.   MRN: [id number]  HPI She is here for a physical exam.   Her HR is always high.  She drinks one cup of coffee in the morning. She sometimes feels her heart racing. She denies irregularity.  Sometimes when she works out she gets tunnel vision.  She is exercising regularly.  She has no other concerns.Normal sinus rhythm at 82 bpm, negative precordial T waves, normal EKG.  No prior for comparison.  Medications and allergies reviewed with patient and updated if appropriate.  Patient Active Problem List   Diagnosis Date Noted  . Acute non-recurrent frontal sinusitis 09/21/2017  . Hypermobility syndrome 03/25/2017  . Nonallopathic lesion of thoracic region 03/25/2017  . Nonallopathic lesion of cervical region 03/25/2017  . Nonallopathic lesion of lumbosacral region 03/25/2017  . Back pain 01/30/2017  . Hyperlipidemia 01/23/2016  . Dandruff 01/23/2016  . Iron deficiency anemia 01/23/2016    Current Outpatient Medications on File Prior to Visit  Medication Sig Dispense Refill  . albuterol (PROVENTIL HFA;VENTOLIN HFA) 108 (90 Base) MCG/ACT inhaler Inhale 2 puffs into the lungs every 4 (four) hours as needed for wheezing or shortness of breath (cough, shortness of breath or wheezing.). 1 Inhaler 1  . etonogestrel-ethinyl estradiol (NUVARING) 0.12-0.015 MG/24HR vaginal ring Place 1 each vaginally every 28 (twenty-eight) days. Insert vaginally and leave in place for 3 consecutive weeks, then remove for 1 week.    . ferrous sulfate 325 (65 FE) MG EC tablet Take 325 mg by mouth daily.     . fluticasone (FLONASE) 50 MCG/ACT nasal spray Place 2 sprays into both nostrils daily. 16 g 2  . ibuprofen (ADVIL,MOTRIN) 600 MG tablet Take 1 tablet (600 mg total) by mouth every 8 (eight) hours as needed. 30 tablet 0  . ketoconazole (NIZORAL) 2 % shampoo Apply 1 application topically 2 (two) times a week. 120 mL 11   No current  facility-administered medications on file prior to visit.     Past Medical History:  Diagnosis Date  . Anemia   . Hyperlipidemia     Past Surgical History:  Procedure Laterality Date  . WISDOM TOOTH EXTRACTION      Social History   Socioeconomic History  . Marital status: Single    Spouse name: Not on file  . Number of children: Not on file  . Years of education: Not on file  . Highest education level: Not on file  Occupational History  . Not on file  Social Needs  . Financial resource strain: Not on file  . Food insecurity:    Worry: Not on file    Inability: Not on file  . Transportation needs:    Medical: Not on file    Non-medical: Not on file  Tobacco Use  . Smoking status: Never Smoker  . Smokeless tobacco: Never Used  Substance and Sexual Activity  . Alcohol use: Yes  . Drug use: No  . Sexual activity: Not on file  Lifestyle  . Physical activity:    Days per week: Not on file    Minutes per session: Not on file  . Stress: Not on file  Relationships  . Social connections:    Talks on phone: Not on file    Gets together: Not on file    Attends religious service: Not on file    Active member of club or organization: Not on file  Attends meetings of clubs or organizations: Not on file    Relationship status: Not on file  Other Topics Concern  . Not on file  Social History Narrative  . Not on file    Family History  Problem Relation Age of Onset  . Diabetes Mother   . Autoimmune disease Mother   . Hyperlipidemia Father   . Cancer Maternal Grandmother        breast  . Breast cancer Maternal Grandmother   . Cancer Paternal Grandmother        breast  . Heart disease Paternal Grandmother   . Breast cancer Paternal Grandmother     Review of Systems  Constitutional: Negative for appetite change, chills, fatigue, fever and unexpected weight change.  HENT: Positive for sinus pain (intermittent) and voice change. Negative for sore throat.   Eyes:  Negative for visual disturbance.  Respiratory: Positive for cough. Negative for shortness of breath and wheezing.   Cardiovascular: Negative for chest pain, palpitations (elevated heart rate) and leg swelling.  Gastrointestinal: Negative for abdominal pain, blood in stool, constipation, diarrhea and nausea.       No gerd   Genitourinary: Negative for dysuria and hematuria.  Musculoskeletal: Positive for back pain (mild). Negative for arthralgias.  Skin: Negative for color change and rash.  Neurological: Negative for light-headedness and headaches.  Psychiatric/Behavioral: Negative for dysphoric mood. The patient is not nervous/anxious.        Objective:   Vitals:   02/11/18 0847  BP: 110/80  Pulse: (!) 105  Resp: 16  Temp: 98.5 F (36.9 C)  SpO2: 97%   Filed Weights   02/11/18 0847  Weight: 123 lb (55.8 kg)   Body mass index is 21.11 kg/m.  Wt Readings from Last 3 Encounters:  02/11/18 123 lb (55.8 kg)  01/30/18 125 lb (56.7 kg)  12/05/17 123 lb (55.8 kg)     Physical Exam Constitutional: She appears well-developed and well-nourished. No distress.  HENT:  Head: Normocephalic and atraumatic.  Right Ear: External ear normal. Normal ear canal and TM Left Ear: External ear normal.  Normal ear canal and TM Mouth/Throat: Oropharynx is clear and moist.  Eyes: Conjunctivae and EOM are normal.  Neck: Neck supple. No tracheal deviation present. No thyromegaly present.  No carotid bruit  Cardiovascular: Normal rate, regular rhythm and normal heart sounds.   No murmur heard.  No edema. Pulmonary/Chest: Effort normal and breath sounds normal. No respiratory distress. She has no wheezes. She has no rales.  Breast: deferred to Gyn Abdominal: Soft. She exhibits no distension. There is no tenderness.  Lymphadenopathy: She has no cervical adenopathy.  Skin: Skin is warm and dry. She is not diaphoretic.  Psychiatric: She has a normal mood and affect. Her behavior is normal.         Assessment & Plan:   Physical exam: Screening blood work  ordered Immunizations   Td done through work, flu up to date Gyn  Up to date  Exercise  regular Weight   Normal BMI Skin    No concerns Substance abuse   none  See Problem List for Assessment and Plan of chronic medical problems.

## 2018-02-11 ENCOUNTER — Ambulatory Visit (INDEPENDENT_AMBULATORY_CARE_PROVIDER_SITE_OTHER): Payer: No Typology Code available for payment source | Admitting: Internal Medicine

## 2018-02-11 ENCOUNTER — Other Ambulatory Visit (INDEPENDENT_AMBULATORY_CARE_PROVIDER_SITE_OTHER): Payer: No Typology Code available for payment source

## 2018-02-11 ENCOUNTER — Encounter: Payer: Self-pay | Admitting: Internal Medicine

## 2018-02-11 VITALS — BP 110/80 | HR 105 | Temp 98.5°F | Resp 16 | Ht 64.0 in | Wt 123.0 lb

## 2018-02-11 DIAGNOSIS — R002 Palpitations: Secondary | ICD-10-CM

## 2018-02-11 DIAGNOSIS — D509 Iron deficiency anemia, unspecified: Secondary | ICD-10-CM

## 2018-02-11 DIAGNOSIS — E782 Mixed hyperlipidemia: Secondary | ICD-10-CM | POA: Diagnosis not present

## 2018-02-11 DIAGNOSIS — Z0000 Encounter for general adult medical examination without abnormal findings: Secondary | ICD-10-CM

## 2018-02-11 LAB — LIPID PANEL
CHOL/HDL RATIO: 3
Cholesterol: 196 mg/dL (ref 0–200)
HDL: 76.5 mg/dL (ref >39.00)
LDL Cholesterol: 100 mg/dL — ABNORMAL HIGH (ref 0–99)
NONHDL: 119.27
Triglycerides: 97 mg/dL (ref 0.0–149.0)
VLDL: 19.4 mg/dL (ref 0.0–40.0)

## 2018-02-11 LAB — COMPREHENSIVE METABOLIC PANEL
ALT: 12 U/L (ref 0–35)
AST: 19 U/L (ref 0–37)
Albumin: 3.9 g/dL (ref 3.5–5.2)
Alkaline Phosphatase: 37 U/L — ABNORMAL LOW (ref 39–117)
BUN: 8 mg/dL (ref 6–23)
CO2: 28 meq/L (ref 19–32)
Calcium: 9.5 mg/dL (ref 8.4–10.5)
Chloride: 103 mEq/L (ref 96–112)
Creatinine, Ser: 0.87 mg/dL (ref 0.40–1.20)
GFR: 100.58 mL/min (ref >60.00)
GLUCOSE: 82 mg/dL (ref 70–99)
POTASSIUM: 4.2 meq/L (ref 3.5–5.1)
Sodium: 138 mEq/L (ref 135–145)
Total Bilirubin: 0.5 mg/dL (ref 0.2–1.2)
Total Protein: 7.4 g/dL (ref 6.0–8.3)

## 2018-02-11 LAB — CBC WITH DIFFERENTIAL/PLATELET
Basophils Absolute: 0 10*3/uL (ref 0.0–0.1)
Basophils Relative: 1.3 % (ref 0.0–3.0)
EOS PCT: 4.8 % (ref 0.0–5.0)
Eosinophils Absolute: 0.2 10*3/uL (ref 0.0–0.7)
HCT: 36.9 % (ref 36.0–46.0)
Hemoglobin: 12.4 g/dL (ref 12.0–15.0)
Lymphocytes Relative: 45.4 % (ref 12.0–46.0)
Lymphs Abs: 1.5 10*3/uL (ref 0.7–4.0)
MCHC: 33.5 g/dL (ref 30.0–36.0)
MCV: 84.6 fl (ref 78.0–100.0)
MONOS PCT: 9.5 % (ref 3.0–12.0)
Monocytes Absolute: 0.3 10*3/uL (ref 0.1–1.0)
NEUTROS ABS: 1.3 10*3/uL — AB (ref 1.4–7.7)
NEUTROS PCT: 39 % — AB (ref 43.0–77.0)
PLATELETS: 300 10*3/uL (ref 150.0–400.0)
RBC: 4.36 Mil/uL (ref 3.87–5.11)
RDW: 15.4 % (ref 11.5–15.5)
WBC: 3.3 10*3/uL — ABNORMAL LOW (ref 4.0–10.5)

## 2018-02-11 LAB — IRON,TIBC AND FERRITIN PANEL
%SAT: 36 % (calc) (ref 11–50)
FERRITIN: 11 ng/mL (ref 10–154)
IRON: 155 ug/dL (ref 40–190)
TIBC: 433 ug/dL (ref 250–450)

## 2018-02-11 LAB — TSH: TSH: 0.9 u[IU]/mL (ref 0.35–4.50)

## 2018-02-11 NOTE — Patient Instructions (Addendum)
Test(s) ordered today. Your results will be released to [place] (or called to you) after review, usually within 72hours after test completion. If any changes need to be made, you will be notified at that same time.  All other Health Maintenance issues reviewed.   All recommended immunizations and age-appropriate screenings are up-to-date or discussed.  No immunizations administered today.   An EKG was done today.  Medications reviewed and updated. No changes recommended at this time.   Please followup in one year    Health Maintenance, Female Adopting a healthy lifestyle and getting preventive care can go a long way to promote health and wellness. Talk with your health care provider about what schedule of regular examinations is right for you. This is a good chance for you to check in with your provider about disease prevention and staying healthy. In between checkups, there are plenty of things you can do on your own. Experts have done a lot of research about which lifestyle changes and preventive measures are most likely to keep you healthy. Ask your health care provider for more information. Weight and diet Eat a healthy diet  Be sure to include plenty of vegetables, fruits, low-fat dairy products, and lean protein.  Do not eat a lot of foods high in solid fats, added sugars, or salt.  Get regular exercise. This is one of the most important things you can do for your health. ? Most adults should exercise for at least 150 minutes each week. The exercise should increase your heart rate and make you sweat (moderate-intensity exercise). ? Most adults should also do strengthening exercises at least twice a week. This is in addition to the moderate-intensity exercise.  Maintain a healthy weight  Body mass index (BMI) is a measurement that can be used to identify possible weight problems. It estimates body fat based on height and weight. Your health care provider can help determine your  BMI and help you achieve or maintain a healthy weight.  For females 74 years of age and older: ? A BMI below 18.5 is considered underweight. ? A BMI of 18.5 to 24.9 is normal. ? A BMI of 25 to 29.9 is considered overweight. ? A BMI of 30 and above is considered obese.  Watch levels of cholesterol and blood lipids  You should start having your blood tested for lipids and cholesterol at 27 years of age, then have this test every 5 years.  You may need to have your cholesterol levels checked more often if: ? Your lipid or cholesterol levels are high. ? You are older than 27 years of age. ? You are at high risk for heart disease.  Cancer screening Lung Cancer  Lung cancer screening is recommended for adults 8-3 years old who are at high risk for lung cancer because of a history of smoking.  A yearly low-dose CT scan of the lungs is recommended for people who: ? Currently smoke. ? Have quit within the past 15 years. ? Have at least a 30-pack-year history of smoking. A pack year is smoking an average of one pack of cigarettes a day for 1 year.  Yearly screening should continue until it has been 15 years since you quit.  Yearly screening should stop if you develop a health problem that would prevent you from having lung cancer treatment.  Breast Cancer  Practice breast self-awareness. This means understanding how your breasts normally appear and feel.  It also means doing regular breast self-exams. Let your health  care provider know about any changes, no matter how small.  If you are in your 20s or 30s, you should have a clinical breast exam (CBE) by a health care provider every 1-3 years as part of a regular health exam.  If you are 81 or older, have a CBE every year. Also consider having a breast X-ray (mammogram) every year.  If you have a family history of breast cancer, talk to your health care provider about genetic screening.  If you are at high risk for breast cancer,  talk to your health care provider about having an MRI and a mammogram every year.  Breast cancer gene (BRCA) assessment is recommended for women who have family members with BRCA-related cancers. BRCA-related cancers include: ? Breast. ? Ovarian. ? Tubal. ? Peritoneal cancers.  Results of the assessment will determine the need for genetic counseling and BRCA1 and BRCA2 testing.  Cervical Cancer Your health care provider may recommend that you be screened regularly for cancer of the pelvic organs (ovaries, uterus, and vagina). This screening involves a pelvic examination, including checking for microscopic changes to the surface of your cervix (Pap test). You may be encouraged to have this screening done every 3 years, beginning at age 29.  For women ages 6-65, health care providers may recommend pelvic exams and Pap testing every 3 years, or they may recommend the Pap and pelvic exam, combined with testing for human papilloma virus (HPV), every 5 years. Some types of HPV increase your risk of cervical cancer. Testing for HPV may also be done on women of any age with unclear Pap test results.  Other health care providers may not recommend any screening for nonpregnant women who are considered low risk for pelvic cancer and who do not have symptoms. Ask your health care provider if a screening pelvic exam is right for you.  If you have had past treatment for cervical cancer or a condition that could lead to cancer, you need Pap tests and screening for cancer for at least 20 years after your treatment. If Pap tests have been discontinued, your risk factors (such as having a new sexual partner) need to be reassessed to determine if screening should resume. Some women have medical problems that increase the chance of getting cervical cancer. In these cases, your health care provider may recommend more frequent screening and Pap tests.  Colorectal Cancer  This type of cancer can be detected and often  prevented.  Routine colorectal cancer screening usually begins at 27 years of age and continues through 27 years of age.  Your health care provider may recommend screening at an earlier age if you have risk factors for colon cancer.  Your health care provider may also recommend using home test kits to check for hidden blood in the stool.  A small camera at the end of a tube can be used to examine your colon directly (sigmoidoscopy or colonoscopy). This is done to check for the earliest forms of colorectal cancer.  Routine screening usually begins at age 10.  Direct examination of the colon should be repeated every 5-10 years through 27 years of age. However, you may need to be screened more often if early forms of precancerous polyps or small growths are found.  Skin Cancer  Check your skin from head to toe regularly.  Tell your health care provider about any new moles or changes in moles, especially if there is a change in a mole's shape or color.  Also tell  your health care provider if you have a mole that is larger than the size of a pencil eraser.  Always use sunscreen. Apply sunscreen liberally and repeatedly throughout the day.  Protect yourself by wearing long sleeves, pants, a wide-brimmed hat, and sunglasses whenever you are outside.  Heart disease, diabetes, and high blood pressure  High blood pressure causes heart disease and increases the risk of stroke. High blood pressure is more likely to develop in: ? People who have blood pressure in the high end of the normal range (130-139/85-89 mm Hg). ? People who are overweight or obese. ? People who are African American.  If you are 87-32 years of age, have your blood pressure checked every 3-5 years. If you are 74 years of age or older, have your blood pressure checked every year. You should have your blood pressure measured twice-once when you are at a hospital or clinic, and once when you are not at a hospital or clinic.  Record the average of the two measurements. To check your blood pressure when you are not at a hospital or clinic, you can use: ? An automated blood pressure machine at a pharmacy. ? A home blood pressure monitor.  If you are between 62 years and 71 years old, ask your health care provider if you should take aspirin to prevent strokes.  Have regular diabetes screenings. This involves taking a blood sample to check your fasting blood sugar level. ? If you are at a normal weight and have a low risk for diabetes, have this test once every three years after 27 years of age. ? If you are overweight and have a high risk for diabetes, consider being tested at a younger age or more often. Preventing infection Hepatitis B  If you have a higher risk for hepatitis B, you should be screened for this virus. You are considered at high risk for hepatitis B if: ? You were born in a country where hepatitis B is common. Ask your health care provider which countries are considered high risk. ? Your parents were born in a high-risk country, and you have not been immunized against hepatitis B (hepatitis B vaccine). ? You have HIV or AIDS. ? You use needles to inject street drugs. ? You live with someone who has hepatitis B. ? You have had sex with someone who has hepatitis B. ? You get hemodialysis treatment. ? You take certain medicines for conditions, including cancer, organ transplantation, and autoimmune conditions.  Hepatitis C  Blood testing is recommended for: ? Everyone born from [birth year] through 1965. ? Anyone with known risk factors for hepatitis C.  Sexually transmitted infections (STIs)  You should be screened for sexually transmitted infections (STIs) including gonorrhea and chlamydia if: ? You are sexually active and are younger than 27 years of age. ? You are older than 27 years of age and your health care provider tells you that you are at risk for this type of infection. ? Your sexual  activity has changed since you were last screened and you are at an increased risk for chlamydia or gonorrhea. Ask your health care provider if you are at risk.  If you do not have HIV, but are at risk, it may be recommended that you take a prescription medicine daily to prevent HIV infection. This is called pre-exposure prophylaxis (PrEP). You are considered at risk if: ? You are sexually active and do not regularly use condoms or know the HIV status of your partner(s). ?  injection. ? You are sexually active with a partner who has HIV.  Talk with your health care provider about whether you are at high risk of being infected with HIV. If you choose to begin PrEP, you should first be tested for HIV. You should then be tested every 3 months for as long as you are taking PrEP. Pregnancy  If you are premenopausal and you may become pregnant, ask your health care provider about preconception counseling.  If you may become pregnant, take 400 to 800 micrograms (mcg) of folic acid every day.  If you want to prevent pregnancy, talk to your health care provider about birth control (contraception). Osteoporosis and menopause  Osteoporosis is a disease in which the bones lose minerals and strength with aging. This can result in serious bone fractures. Your risk for osteoporosis can be identified using a bone density scan.  If you are 65 years of age or older, or if you are at risk for osteoporosis and fractures, ask your health care provider if you should be screened.  Ask your health care provider whether you should take a calcium or vitamin D supplement to lower your risk for osteoporosis.  Menopause may have certain physical symptoms and risks.  Hormone replacement therapy may reduce some of these symptoms and risks. Talk to your health care provider about whether hormone replacement therapy is right for you. Follow these instructions at home:  Schedule regular health, dental, and eye exams.  Stay current  with your immunizations.  Do not use any tobacco products including cigarettes, chewing tobacco, or electronic cigarettes.  If you are pregnant, do not drink alcohol.  If you are breastfeeding, limit how much and how often you drink alcohol.  Limit alcohol intake to no more than 1 drink per day for nonpregnant women. One drink equals 12 ounces of beer, 5 ounces of wine, or 1 ounces of hard liquor.  Do not use street drugs.  Do not share needles.  Ask your health care provider for help if you need support or information about quitting drugs.  Tell your health care provider if you often feel depressed.  Tell your health care provider if you have ever been abused or do not feel safe at home. This information is not intended to replace advice given to you by your health care provider. Make sure you discuss any questions you have with your health care provider. Document Released: 04/16/2011 Document Revised: 03/08/2016 Document Reviewed: 07/05/2015 Elsevier Interactive Patient Education  2018 [employer]  

## 2018-02-11 NOTE — Assessment & Plan Note (Signed)
perdiods normal Check iron panel, cbc

## 2018-02-11 NOTE — Assessment & Plan Note (Signed)
Check labs-TSH, CBC, CMP Not related to caffeine, supplements/medications or stress Mild tachycardia, which she will continue to monitor closely to see how often it is and if there is any causes EKG today: Normal sinus rhythm at 82 bpm, negative precordial T waves, normal EKG.  No prior for comparison.

## 2018-02-11 NOTE — Assessment & Plan Note (Addendum)
Cholesterol has been slightly elevated in the past-most recently total cholesterol more than 200, but HDL high.  LDL last year was well controlled Was on atorvastatin for short period of time pescartarian Check lipid panel, CMP, TSH

## 2018-02-13 ENCOUNTER — Encounter: Payer: Self-pay | Admitting: Internal Medicine

## 2018-04-08 ENCOUNTER — Encounter: Payer: Self-pay | Admitting: Internal Medicine

## 2018-04-09 NOTE — Progress Notes (Signed)
Tawana ScaleZach Thaila Bottoms D.O. [facility] [address] [place], [place] [address] Phone: [phone] Subjective:     CC: Neck and back pain follow-up  [id number]:[id number]:Subjective  Stacy [name] is a 27 y.o. female coming in with complaint of neck and back pain.  Patient does have benign hypermobility syndrome.  Has been doing iron and vitamin D supplementation.  Patient has been doing very well with osteopathic manipulation and doing home exercises regularly.  Patient states     Past Medical History:  Diagnosis Date  . Anemia   . Hyperlipidemia    Past Surgical History:  Procedure Laterality Date  . WISDOM TOOTH EXTRACTION     Social History   Socioeconomic History  . Marital status: Single    Spouse name: Not on file  . Number of children: Not on file  . Years of education: Not on file  . Highest education level: Not on file  Occupational History  . Not on file  Social Needs  . Financial resource strain: Not on file  . Food insecurity:    Worry: Not on file    Inability: Not on file  . Transportation needs:    Medical: Not on file    Non-medical: Not on file  Tobacco Use  . Smoking status: Never Smoker  . Smokeless tobacco: Never Used  Substance and Sexual Activity  . Alcohol use: Yes  . Drug use: No  . Sexual activity: Not on file  Lifestyle  . Physical activity:    Days per week: Not on file    Minutes per session: Not on file  . Stress: Not on file  Relationships  . Social connections:    Talks on phone: Not on file    Gets together: Not on file    Attends religious service: Not on file    Active member of club or organization: Not on file    Attends meetings of clubs or organizations: Not on file    Relationship status: Not on file  Other Topics Concern  . Not on file  Social History Narrative  . Not on file   Allergies  Allergen Reactions  . Doxycycline Other (See Comments)    Decreased WBC, Neutropenia   Family History  Problem Relation Age of Onset    . Diabetes Mother   . Autoimmune disease Mother   . Hyperlipidemia Father   . Cancer Maternal Grandmother        breast  . Breast cancer Maternal Grandmother   . Cancer Paternal Grandmother        breast  . Heart disease Paternal Grandmother   . Breast cancer Paternal Grandmother      Past medical history, social, surgical and family history all reviewed in electronic medical record.  No pertanent information unless stated regarding to the chief complaint.   Review of Systems:Review of systems updated and as accurate as of 04/10/18  No headache, visual changes, nausea, vomiting, diarrhea, constipation, dizziness, abdominal pain, skin rash, fevers, chills, night sweats, weight loss, swollen lymph nodes, body aches, joint swelling,  chest pain, shortness of breath, mood changes.  Positive muscle aches  Objective  Blood pressure 120/74, pulse 98, height 5\' 4"  (1.626 m), weight 125 lb (56.7 kg), SpO2 97 %. Systems examined below as of 04/10/18   General: No apparent distress alert and oriented x3 mood and affect normal, dressed appropriately.  HEENT: Pupils equal, extraocular movements intact  Respiratory: Patient's speak in full sentences and does not appear short  of breath  Cardiovascular: No lower extremity edema, non tender, no erythema  Skin: Warm dry intact with no signs of infection or rash on extremities or on axial skeleton.  Abdomen: Soft nontender  Neuro: Cranial nerves II through XII are intact, neurovascularly intact in all extremities with 2+ DTRs and 2+ pulses.  Lymph: No lymphadenopathy of posterior or anterior cervical chain or axillae bilaterally.  Gait normal with good balance and coordination.  MSK:  Non tender with full range of motion and good stability and symmetric strength and tone of shoulders, elbows, wrist, hip, knee and ankles bilaterally.  Neck: Inspection mild increase in lordosis. No palpable stepoffs. Negative Spurling's maneuver. Patient does have  some mild limitation to left-sided rotation and sidebending Grip strength and sensation normal in bilateral hands Strength good C4 to T1 distribution No sensory change to C4 to T1 Negative Hoffman sign bilaterally Reflexes normal  Back Exam:  Inspection: Unremarkable  Motion: Flexion 45 deg, Extension 45 deg, Side Bending to 45 deg bilaterally,  Rotation to 45 deg bilaterally  SLR laying: Negative  XSLR laying: Negative  Palpable tenderness: Tender to palpation of the paraspinal musculature lumbar spine right greater than left. FABER: Mild tightness of the right. Sensory change: Gross sensation intact to all lumbar and sacral dermatomes.  Reflexes: 2+ at both patellar tendons, 2+ at achilles tendons, Babinski's downgoing.  Strength at foot  Plantar-flexion: 5/5 Dorsi-flexion: 5/5 Eversion: 5/5 Inversion: 5/5  Leg strength  Quad: 5/5 Hamstring: 5/5 Hip flexor: 5/5 Hip abductors: 5/5  Gait unremarkable.  Osteopathic findings  C2 flexed rotated and side bent left  T4 extended rotated and side bent left inhaled rib T9 extended rotated and side bent right L2 flexed rotated and side bent right Sacrum right on right    Impression and Recommendations:     This case required medical decision making of moderate complexity.      Note: This dictation was prepared with Dragon dictation along with smaller phrase technology. Any transcriptional errors that result from this process are unintentional.

## 2018-04-10 ENCOUNTER — Encounter: Payer: Self-pay | Admitting: Family Medicine

## 2018-04-10 ENCOUNTER — Ambulatory Visit: Payer: No Typology Code available for payment source | Admitting: Family Medicine

## 2018-04-10 VITALS — BP 120/74 | HR 98 | Ht 64.0 in | Wt 125.0 lb

## 2018-04-10 DIAGNOSIS — M357 Hypermobility syndrome: Secondary | ICD-10-CM

## 2018-04-10 DIAGNOSIS — M999 Biomechanical lesion, unspecified: Secondary | ICD-10-CM | POA: Diagnosis not present

## 2018-04-10 MED ORDER — KETOCONAZOLE 2 % EX SHAM: 1.0000 "application " | MEDICATED_SHAMPOO | CUTANEOUS | 11 refills | Status: DC

## 2018-04-10 NOTE — Patient Instructions (Signed)
Good to see you  Gustavus Bryantce is your friend.  2 tennis ball in a tube sock and lay on them where head meets neck  Have a great birthday  See me again in 10 weeks

## 2018-04-10 NOTE — Assessment & Plan Note (Signed)
Patient does have some mild hypermobility going on still.  There is some instability.  We discussed the importance of the strength and endurance.  Follow-up again in 4 to 6 weeks.

## 2018-04-10 NOTE — Assessment & Plan Note (Signed)
Decision today to treat with OMT was based on Physical Exam  After verbal consent patient was treated with HVLA, ME, FPR techniques in cervical, thoracic, rib lumbar and sacral areas  Patient tolerated the procedure well with improvement in symptoms  Patient given exercises, stretches and lifestyle modifications  See medications in patient instructions if given  Patient will follow up in 4 weeks 

## 2018-06-18 NOTE — Progress Notes (Signed)
[name] [address] [place], [place] [address] Phone: [phone] Subjective:   Bruce Donath, am serving as a scribe for Dr. Antoine Primas.    CC: Neck pain follow-up  [id number]  Stacy [name] is a 27 y.o. female coming in with complaint of neck pain. She does have some relief with OMT. Notices intermittent popping. Denies any headaches or radicular symptoms.  Patient has had hypermobility previously.  Has been doing some the exercises occasionally.  Has noticed some increasing discomfort and pain with certain activities.  Working out though on a more regular basis.    Past Medical History:  Diagnosis Date  . Anemia   . Hyperlipidemia    Past Surgical History:  Procedure Laterality Date  . WISDOM TOOTH EXTRACTION     Social History   Socioeconomic History  . Marital status: Single    Spouse name: Not on file  . Number of children: Not on file  . Years of education: Not on file  . Highest education level: Not on file  Occupational History  . Not on file  Social Needs  . Financial resource strain: Not on file  . Food insecurity:    Worry: Not on file    Inability: Not on file  . Transportation needs:    Medical: Not on file    Non-medical: Not on file  Tobacco Use  . Smoking status: Never Smoker  . Smokeless tobacco: Never Used  Substance and Sexual Activity  . Alcohol use: Yes  . Drug use: No  . Sexual activity: Not on file  Lifestyle  . Physical activity:    Days per week: Not on file    Minutes per session: Not on file  . Stress: Not on file  Relationships  . Social connections:    Talks on phone: Not on file    Gets together: Not on file    Attends religious service: Not on file    Active member of club or organization: Not on file    Attends meetings of clubs or organizations: Not on file    Relationship status: Not on file  Other Topics Concern  . Not on file  Social History Narrative  . Not on file    Allergies  Allergen Reactions  . Doxycycline Other (See Comments)    Decreased WBC, Neutropenia   Family History  Problem Relation Age of Onset  . Diabetes Mother   . Autoimmune disease Mother   . Hyperlipidemia Father   . Cancer Maternal Grandmother        breast  . Breast cancer Maternal Grandmother   . Cancer Paternal Grandmother        breast  . Heart disease Paternal Grandmother   . Breast cancer Paternal Grandmother     Current Outpatient Medications (Endocrine & Metabolic):  .  etonogestrel-ethinyl estradiol (NUVARING) 0.12-0.015 MG/24HR vaginal ring, Place 1 each vaginally every 28 (twenty-eight) days. Insert vaginally and leave in place for 3 consecutive weeks, then remove for 1 week.   Current Outpatient Medications (Respiratory):  .  albuterol (PROVENTIL HFA;VENTOLIN HFA) 108 (90 Base) MCG/ACT inhaler, Inhale 2 puffs into the lungs every 4 (four) hours as needed for wheezing or shortness of breath (cough, shortness of breath or wheezing.). [name]  fluticasone (FLONASE) 50 MCG/ACT nasal spray, Place 2 sprays into both nostrils daily.  Current Outpatient Medications (Analgesics):  .  ibuprofen (ADVIL,MOTRIN) 600 MG tablet, Take 1 tablet (600 mg total) by mouth every  8 (eight) hours as needed.  Current Outpatient Medications (Hematological):  .  ferrous sulfate 325 (65 FE) MG EC tablet, Take 325 mg by mouth daily.   Current Outpatient Medications (Other):  .  ketoconazole (NIZORAL) 2 % shampoo, Apply 1 application topically 2 (two) times a week.    Past medical history, social, surgical and family history all reviewed in electronic medical record.  No pertanent information unless stated regarding to the chief complaint.   Review of Systems:  No headache, visual changes, nausea, vomiting, diarrhea, constipation, dizziness, abdominal pain, skin rash, fevers, chills, night sweats, weight loss, swollen lymph nodes, body aches, joint swelling, chest pain, shortness of  breath, mood changes.  Positive muscle aches  Objective  Blood pressure 130/82, pulse 93, height 5\' 4"  (1.626 m), weight 128 lb (58.1 kg), SpO2 98 %.   General: No apparent distress alert and oriented x3 mood and affect normal, dressed appropriately.  HEENT: Pupils equal, extraocular movements intact conjunctiva is pale Respiratory: Patient's speak in full sentences and does not appear short of breath  Cardiovascular: No lower extremity edema, non tender, no erythema  Skin: Warm dry intact with no signs of infection or rash on extremities or on axial skeleton.  Abdomen: Soft nontender  Neuro: Cranial nerves II through XII are intact, neurovascularly intact in all extremities with 2+ DTRs and 2+ pulses.  Lymph: No lymphadenopathy of posterior or anterior cervical chain or axillae bilaterally.  Gait normal with good balance and coordination.  MSK:  Non tender with full range of motion and good stability and symmetric strength and tone of shoulders, elbows, wrist, hip, knee and ankles bilaterally.  Hypermobility of multiple joints Neck: Inspection unremarkable. No palpable stepoffs. Negative Spurling's maneuver. Mild limitation with right-sided rotation and sidebending. Grip strength and sensation normal in bilateral hands Strength good C4 to T1 distribution No sensory change to C4 to T1 Negative Hoffman sign bilaterally Reflexes normal  Osteopathic findings C2 flexed rotated and side bent right C6 flexed rotated and side bent left T3 extended rotated and side bent right inhaled third rib L2 flexed rotated and side bent right Sacrum right on right    Impression and Recommendations:     This case required medical decision making of moderate complexity. The above documentation has been reviewed and is accurate and complete Judi Saa, DO       Note: This dictation was prepared with Dragon dictation along with smaller phrase technology. Any transcriptional errors that result  from this process are unintentional.

## 2018-06-19 ENCOUNTER — Encounter: Payer: Self-pay | Admitting: Family Medicine

## 2018-06-19 ENCOUNTER — Ambulatory Visit: Payer: No Typology Code available for payment source | Admitting: Family Medicine

## 2018-06-19 VITALS — BP 130/82 | HR 93 | Ht 64.0 in | Wt 128.0 lb

## 2018-06-19 DIAGNOSIS — M546 Pain in thoracic spine: Secondary | ICD-10-CM

## 2018-06-19 DIAGNOSIS — M940 Chondrocostal junction syndrome [Tietze]: Secondary | ICD-10-CM | POA: Insufficient documentation

## 2018-06-19 DIAGNOSIS — G8929 Other chronic pain: Secondary | ICD-10-CM

## 2018-06-19 DIAGNOSIS — M999 Biomechanical lesion, unspecified: Secondary | ICD-10-CM

## 2018-06-19 NOTE — Assessment & Plan Note (Signed)
Stable.  More of a upper back pain.  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  Discussed icing regimen.  Discussed taking the iron on a more regular basis again.  Follow-up again in 2 to 3 months

## 2018-06-19 NOTE — Patient Instructions (Addendum)
Good to see you  Overall not bad.  I am impressed.  Keep working on the muscle imbalances and the posture.  Try not to fully extend the elbow.  Iron 65mg  with 500mg  of vitamin C daily might help a lot of little things See me again in 2 months

## 2018-06-19 NOTE — Assessment & Plan Note (Signed)
Secondary to posture and ergonomics.  Discussed icing regimen and home exercises.  Discussed which activities of doing which wants to avoid.

## 2018-06-19 NOTE — Assessment & Plan Note (Signed)
Decision today to treat with OMT was based on Physical Exam  After verbal consent patient was treated with HVLA, ME, FPR techniques in cervical, thoracic, rib, lumbar and sacral areas  Patient tolerated the procedure well with improvement in symptoms  Patient given exercises, stretches and lifestyle modifications  See medications in patient instructions if given  Patient will follow up in 8 weeks 

## 2018-08-21 ENCOUNTER — Ambulatory Visit: Payer: No Typology Code available for payment source | Admitting: Family Medicine

## 2018-08-21 ENCOUNTER — Encounter: Payer: Self-pay | Admitting: Family Medicine

## 2018-08-21 VITALS — BP 110/78 | HR 110 | Ht 64.0 in | Wt 134.0 lb

## 2018-08-21 DIAGNOSIS — M940 Chondrocostal junction syndrome [Tietze]: Secondary | ICD-10-CM

## 2018-08-21 DIAGNOSIS — M999 Biomechanical lesion, unspecified: Secondary | ICD-10-CM

## 2018-08-21 NOTE — Assessment & Plan Note (Signed)
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  I do believe the patient does need to continue to work on ergonomics and strengthening of the scapular region.  Discussed icing regimen and home exercises.  Follow-up again in 4 to 6 weeks

## 2018-08-21 NOTE — Progress Notes (Signed)
[name] [address] [place], [place] [address] Phone: [phone] Subjective:   Bruce Donath, am serving as a scribe for Dr. Antoine Primas.    CC: Neck and back pain follow-up  [id number]  Stacy [name] is a 27 y.o. female coming in with complaint of back and neck pain. She said that she feels the same with no changes in pain.  Hypermobility.  Discussed icing regimen and home exercise.  Which activities to do which wants to avoid.      Past Medical History:  Diagnosis Date  . Anemia   . Hyperlipidemia    Past Surgical History:  Procedure Laterality Date  . WISDOM TOOTH EXTRACTION     Social History   Socioeconomic History  . Marital status: Single    Spouse name: Not on file  . Number of children: Not on file  . Years of education: Not on file  . Highest education level: Not on file  Occupational History  . Not on file  Social Needs  . Financial resource strain: Not on file  . Food insecurity:    Worry: Not on file    Inability: Not on file  . Transportation needs:    Medical: Not on file    Non-medical: Not on file  Tobacco Use  . Smoking status: Never Smoker  . Smokeless tobacco: Never Used  Substance and Sexual Activity  . Alcohol use: Yes  . Drug use: No  . Sexual activity: Not on file  Lifestyle  . Physical activity:    Days per week: Not on file    Minutes per session: Not on file  . Stress: Not on file  Relationships  . Social connections:    Talks on phone: Not on file    Gets together: Not on file    Attends religious service: Not on file    Active member of club or organization: Not on file    Attends meetings of clubs or organizations: Not on file    Relationship status: Not on file  Other Topics Concern  . Not on file  Social History Narrative  . Not on file   Allergies  Allergen Reactions  . Doxycycline Other (See Comments)    Decreased WBC, Neutropenia   Family History  Problem Relation  Age of Onset  . Diabetes Mother   . Autoimmune disease Mother   . Hyperlipidemia Father   . Cancer Maternal Grandmother        breast  . Breast cancer Maternal Grandmother   . Cancer Paternal Grandmother        breast  . Heart disease Paternal Grandmother   . Breast cancer Paternal Grandmother     Current Outpatient Medications (Endocrine & Metabolic):  .  etonogestrel-ethinyl estradiol (NUVARING) 0.12-0.015 MG/24HR vaginal ring, Place 1 each vaginally every 28 (twenty-eight) days. Insert vaginally and leave in place for 3 consecutive weeks, then remove for 1 week.   Current Outpatient Medications (Respiratory):  .  albuterol (PROVENTIL HFA;VENTOLIN HFA) 108 (90 Base) MCG/ACT inhaler, Inhale 2 puffs into the lungs every 4 (four) hours as needed for wheezing or shortness of breath (cough, shortness of breath or wheezing.). [name]  fluticasone (FLONASE) 50 MCG/ACT nasal spray, Place 2 sprays into both nostrils daily.  Current Outpatient Medications (Analgesics):  .  ibuprofen (ADVIL,MOTRIN) 600 MG tablet, Take 1 tablet (600 mg total) by mouth every 8 (eight) hours as needed.  Current Outpatient Medications (Hematological):  .  ferrous  sulfate 325 (65 FE) MG EC tablet, Take 325 mg by mouth daily.   Current Outpatient Medications (Other):  .  ketoconazole (NIZORAL) 2 % shampoo, Apply 1 application topically 2 (two) times a week.    Past medical history, social, surgical and family history all reviewed in electronic medical record.  No pertanent information unless stated regarding to the chief complaint.   Review of Systems:  No headache, visual changes, nausea, vomiting, diarrhea, constipation, dizziness, abdominal pain, skin rash, fevers, chills, night sweats, weight loss, swollen lymph nodes, body aches, joint swelling, , chest pain, shortness of breath, mood changes.  Mild positive muscle aches  Objective  Blood pressure 110/78, pulse (!) 110, height 5\' 4"  (1.626 m), weight 134 lb  (60.8 kg), SpO2 98 %.    General: No apparent distress alert and oriented x3 mood and affect normal, dressed appropriately.  HEENT: Pupils equal, extraocular movements intact  Respiratory: Patient's speak in full sentences and does not appear short of breath  Cardiovascular: No lower extremity edema, non tender, no erythema  Skin: Warm dry intact with no signs of infection or rash on extremities or on axial skeleton.  Abdomen: Soft nontender  Neuro: Cranial nerves II through XII are intact, neurovascularly intact in all extremities with 2+ DTRs and 2+ pulses.  Lymph: No lymphadenopathy of posterior or anterior cervical chain or axillae bilaterally.  Gait antalgic due to patient wearing a cam walker MSK:  Non tender with full range of motion and good stability and symmetric strength and tone of shoulders, elbows, wrist, hip, knee and ankles bilaterally.  Hypermobility noted  Neck: Inspection mild loss of lordosis. No palpable stepoffs. Negative Spurling's maneuver. Patient does have some limited range of motion of 5 degrees extension Grip strength and sensation normal in bilateral hands Strength good C4 to T1 distribution No sensory change to C4 to T1 Negative Hoffman sign bilaterally Reflexes normal Tender to palpation in the paraspinal musculature in the lumbar spine bilaterally  Osteopathic findings C2 flexed rotated and side bent right C4 flexed rotated and side bent left T3 extended rotated and side bent right inhaled third rib T6 extended rotated and side bent left L2 flexed rotated and side bent right Sacrum right on right     Impression and Recommendations:     This case required medical decision making of moderate complexity. The above documentation has been reviewed and is accurate and complete Judi Saa, DO       Note: This dictation was prepared with Dragon dictation along with smaller phrase technology. Any transcriptional errors that result from this  process are unintentional.

## 2018-08-21 NOTE — Assessment & Plan Note (Signed)
Decision today to treat with OMT was based on Physical Exam  After verbal consent patient was treated with HVLA, ME, FPR techniques in cervical, thoracic, rib, lumbar and sacral areas  Patient tolerated the procedure well with improvement in symptoms  Patient given exercises, stretches and lifestyle modifications  See medications in patient instructions if given  Patient will follow up in 8-10 weeks 

## 2018-08-21 NOTE — Patient Instructions (Addendum)
Good to see you  Ice is your friend PPosterior tibialis giving you trouble Something with a little heel will help Hiking boot might not be bead [employer] orthotics "total support" online would be great  See me again in 8-10 weeks!

## 2018-10-15 NOTE — Progress Notes (Signed)
Stacy [name], Stacy [name] Phone: [phone] Subjective:     CC: Back pain follow-up  MVE:[id number]  Stacy [name] is a 28 y.o. female coming in with complaint of back pain.  Discussed with patient in great length.  Discussed icing regimen and home exercise.  Which activities to do patient has been doing it.  Having more throat discomfort though on the right side. Nothing severe      Past Medical History:  Diagnosis Date  . Anemia   . Hyperlipidemia    Past Surgical History:  Procedure Laterality Date  . WISDOM TOOTH EXTRACTION     Social History   Socioeconomic History  . Marital status: Single    Spouse name: Not on file  . Number of children: Not on file  . Years of education: Not on file  . Highest education level: Not on file  Occupational History  . Not on file  Social Needs  . Financial resource strain: Not on file  . Food insecurity:    Worry: Not on file    Inability: Not on file  . Transportation needs:    Medical: Not on file    Non-medical: Not on file  Tobacco Use  . Smoking status: Never Smoker  . Smokeless tobacco: Never Used  Substance and Sexual Activity  . Alcohol use: Yes  . Drug use: No  . Sexual activity: Not on file  Lifestyle  . Physical activity:    Days per week: Not on file    Minutes per session: Not on file  . Stress: Not on file  Relationships  . Social connections:    Talks on phone: Not on file    Gets together: Not on file    Attends religious service: Not on file    Active member of club or organization: Not on file    Attends meetings of clubs or organizations: Not on file    Relationship status: Not on file  Other Topics Concern  . Not on file  Social History Narrative  . Not on file   Allergies  Allergen Reactions  . Doxycycline Other (See Comments)    Decreased WBC, Neutropenia   Family History  Problem Relation Age of Onset  . Diabetes Mother   .  Autoimmune disease Mother   . Hyperlipidemia Father   . Cancer Maternal Grandmother        breast  . Breast cancer Maternal Grandmother   . Cancer Paternal Grandmother        breast  . Heart disease Paternal Grandmother   . Breast cancer Paternal Grandmother     Current Outpatient Medications (Endocrine & Metabolic):  .  etonogestrel-ethinyl estradiol (NUVARING) 0.12-0.015 MG/24HR vaginal ring, Place 1 each vaginally every 28 (twenty-eight) days. Insert vaginally and leave in place for 3 consecutive weeks, then remove for 1 week.   Current Outpatient Medications (Respiratory):  .  albuterol (PROVENTIL HFA;VENTOLIN HFA) 108 (90 Base) MCG/ACT inhaler, Inhale 2 puffs into the lungs every 4 (four) hours as needed for wheezing or shortness of breath (cough, shortness of breath or wheezing.). [name]  fluticasone (FLONASE) 50 MCG/ACT nasal spray, Place 2 sprays into both nostrils daily.  Current Outpatient Medications (Analgesics):  .  ibuprofen (ADVIL,MOTRIN) 600 MG tablet, Take 1 tablet (600 mg total) by mouth every 8 (eight) hours as needed.  Current Outpatient Medications (Hematological):  .  ferrous sulfate 325 (65 FE) MG EC tablet, Take 325 mg by  mouth daily.   Current Outpatient Medications (Other):  .  ketoconazole (NIZORAL) 2 % shampoo, Apply 1 application topically 2 (two) times a week.    Past medical history, social, surgical and family history all reviewed in electronic medical record.  No pertanent information unless stated regarding to the chief complaint.   Review of Systems:  No headache, visual changes, nausea, vomiting, diarrhea, constipation, dizziness, abdominal pain, skin rash, fevers, chills, night sweats, weight loss, swollen lymph nodes, body aches, joint swelling, , chest pain, shortness of breath, mood changes.  Positive muscle aches  Objective  Height 5\' 4"  (1.626 m), weight 134 lb (60.8 kg).    General: No apparent distress alert and oriented x3 mood and affect  normal, dressed appropriately.  HEENT: Pupils equal, extraocular movements intact  Respiratory: Patient's speak in full sentences and does not appear short of breath  Cardiovascular: No lower extremity edema, non tender, no erythema  Skin: Warm dry intact with no signs of infection or rash on extremities or on axial skeleton.  Abdomen: Soft nontender  Neuro: Cranial nerves II through XII are intact, neurovascularly intact in all extremities with 2+ DTRs and 2+ pulses.  Lymph: No lymphadenopathy of posterior or anterior cervical chain or axillae bilaterally.  Gait normal with good balance and coordination.  MSK:  Non tender with full range of motion and good stability and symmetric strength and tone of shoulders, elbows, wrist, hip, knee and ankles bilaterally.  Hypermobility noted Back Exam:  Inspection: Mild loss of lordosis Motion: Flexion 45 deg, Extension 25 deg, Side Bending to 35 deg bilaterally,  Rotation to 35 deg bilaterally  SLR laying: Negative  XSLR laying: Negative  Palpable tenderness: Tender to palpation of the paraspinal musculature lumbar spine right greater than left. FABER: Positive Faber. Sensory change: Gross sensation intact to all lumbar and sacral dermatomes.  Reflexes: 2+ at both patellar tendons, 2+ at achilles tendons, Babinski's downgoing.  Strength at foot  Plantar-flexion: 5/5 Dorsi-flexion: 5/5 Eversion: 5/5 Inversion: 5/5  Leg strength  Quad: 5/5 Hamstring: 5/5 Hip flexor: 5/5 Hip abductors: 5/5    Neck: Inspection unremarkable. No palpable stepoffs. Negative Spurling's maneuver. Full neck range of motion Grip strength and sensation normal in bilateral hands Strength good C4 to T1 distribution No sensory change to C4 to T1 Negative Hoffman sign bilaterally Reflexes normal  Osteopathic findings  C6 flexed rotated and side bent left T3 extended rotated and side bent right inhaled third rib T9 extended rotated and side bent left L2 flexed rotated  and side bent right Sacrum right on right    Impression and Recommendations:     This case required medical decision making of moderate complexity. The above documentation has been reviewed and is accurate and complete Judi SaaZachary M Cleta Heatley, DO       Note: This dictation was prepared with Dragon dictation along with smaller phrase technology. Any transcriptional errors that result from this process are unintentional.

## 2018-10-16 ENCOUNTER — Encounter: Payer: Self-pay | Admitting: Family Medicine

## 2018-10-16 ENCOUNTER — Ambulatory Visit: Payer: No Typology Code available for payment source | Admitting: Family Medicine

## 2018-10-16 VITALS — Ht 64.0 in | Wt 134.0 lb

## 2018-10-16 DIAGNOSIS — M999 Biomechanical lesion, unspecified: Secondary | ICD-10-CM

## 2018-10-16 DIAGNOSIS — M357 Hypermobility syndrome: Secondary | ICD-10-CM

## 2018-10-16 DIAGNOSIS — M940 Chondrocostal junction syndrome [Tietze]: Secondary | ICD-10-CM | POA: Diagnosis not present

## 2018-10-16 NOTE — Patient Instructions (Signed)
Good to see you  Ice is your friend Keep hands within peripheral vision  Try the band exercises more and some of the pennsaid See me again in 6 weeks

## 2018-10-16 NOTE — Assessment & Plan Note (Signed)
Likely still contributing.  Discussed icing regimen and home exercise.  Discussed which activities to do which wants to avoid.  Increase activity slowly over the course of next several days.  Discussed posture and ergonomics.  Note given for an adjustable standing desk

## 2018-10-16 NOTE — Assessment & Plan Note (Signed)
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  Patient was having more difficulty though noted.  Discussed icing regimen and home exercises, discussed which activities to doing which wants to avoid.  Patient is to increase activity slowly.  Follow-up again in 4 to 8 weeks

## 2018-10-16 NOTE — Assessment & Plan Note (Signed)
Decision today to treat with OMT was based on Physical Exam  After verbal consent patient was treated with HVLA, ME, FPR techniques in cervical, thoracic, rib lumbar and sacral areas  Patient tolerated the procedure well with improvement in symptoms  Patient given exercises, stretches and lifestyle modifications  See medications in patient instructions if given  Patient will follow up in 4-8 weeks 

## 2018-11-11 ENCOUNTER — Telehealth: Payer: Self-pay

## 2018-11-11 DIAGNOSIS — H5789 Other specified disorders of eye and adnexa: Secondary | ICD-10-CM

## 2018-11-11 NOTE — Telephone Encounter (Signed)
Copied from CRM [id number]. Topic: Referral - Request for Referral >> Nov 11, 2018 12:18 PM Wyonia Hough E wrote: Has patient seen PCP for this complaint? No - woke up this morning with eye irritation and burning *If NO, is insurance requiring patient see PCP for this issue before PCP can refer them? Referral for which specialty: eye doctor Preferred provider/office: [facility]  Reason for referral: eye irration   [facility] would like to work the Pt in today but needed a referral / please advise asap >> Nov 11, 2018  1:40 PM Mickel Baas B, NT wrote: Patient calling to check status of referral. States that this referral is needed for insurance purposes because she has the [employer]. Please advise. Patient has appointment with [facility] today.

## 2018-11-11 NOTE — Telephone Encounter (Signed)
Spoke with pt and advised that referral has been put in.

## 2018-11-19 NOTE — Progress Notes (Signed)
Stacy ScaleZach [name] D.O. [facility] [address] [place], [place] [address] Phone: [phone] Subjective:    I Stacy NighKana [name] am serving as a Neurosurgeonscribe for Dr. Antoine PrimasZachary [name].  CC: Back pain  NUU:[id number]:Subjective  Stacy [name] is a 28 y.o. female coming in with complaint of back pain. Has been having neck pain the last couple of weeks. She does not remember what caused it. Right shoulder feels "off".      Past Medical History:  Diagnosis Date  . Anemia   . Hyperlipidemia    Past Surgical History:  Procedure Laterality Date  . WISDOM TOOTH EXTRACTION     Social History   Socioeconomic History  . Marital status: Single    Spouse name: Not on file  . Number of children: Not on file  . Years of education: Not on file  . Highest education level: Not on file  Occupational History  . Not on file  Social Needs  . Financial resource strain: Not on file  . Food insecurity:    Worry: Not on file    Inability: Not on file  . Transportation needs:    Medical: Not on file    Non-medical: Not on file  Tobacco Use  . Smoking status: Never Smoker  . Smokeless tobacco: Never Used  Substance and Sexual Activity  . Alcohol use: Yes  . Drug use: No  . Sexual activity: Not on file  Lifestyle  . Physical activity:    Days per week: Not on file    Minutes per session: Not on file  . Stress: Not on file  Relationships  . Social connections:    Talks on phone: Not on file    Gets together: Not on file    Attends religious service: Not on file    Active member of club or organization: Not on file    Attends meetings of clubs or organizations: Not on file    Relationship status: Not on file  Other Topics Concern  . Not on file  Social History Narrative  . Not on file   Allergies  Allergen Reactions  . Doxycycline Other (See Comments)    Decreased WBC, Neutropenia   Family History  Problem Relation Age of Onset  . Diabetes Mother   . Autoimmune disease Mother   .  Hyperlipidemia Father   . Cancer Maternal Grandmother        breast  . Breast cancer Maternal Grandmother   . Cancer Paternal Grandmother        breast  . Heart disease Paternal Grandmother   . Breast cancer Paternal Grandmother     Current Outpatient Medications (Endocrine & Metabolic):  .  etonogestrel-ethinyl estradiol (NUVARING) 0.12-0.015 MG/24HR vaginal ring, Place 1 each vaginally every 28 (twenty-eight) days. Insert vaginally and leave in place for 3 consecutive weeks, then remove for 1 week.   Current Outpatient Medications (Respiratory):  .  albuterol (PROVENTIL HFA;VENTOLIN HFA) 108 (90 Base) MCG/ACT inhaler, Inhale 2 puffs into the lungs every 4 (four) hours as needed for wheezing or shortness of breath (cough, shortness of breath or wheezing.). [name].  fluticasone (FLONASE) 50 MCG/ACT nasal spray, Place 2 sprays into both nostrils daily.  Current Outpatient Medications (Analgesics):  .  ibuprofen (ADVIL,MOTRIN) 600 MG tablet, Take 1 tablet (600 mg total) by mouth every 8 (eight) hours as needed.  Current Outpatient Medications (Hematological):  .  ferrous sulfate 325 (65 FE) MG EC tablet, Take 325 mg by mouth daily.  Current Outpatient Medications (Other):  .  ketoconazole (NIZORAL) 2 % shampoo, Apply 1 application topically 2 (two) times a week.    Past medical history, social, surgical and family history all reviewed in electronic medical record.  No pertanent information unless stated regarding to the chief complaint.   Review of Systems:  No headache, visual changes, nausea, vomiting, diarrhea, constipation, dizziness, abdominal pain, skin rash, fevers, chills, night sweats, weight loss, swollen lymph nodes, body aches, joint swelling, chest pain, shortness of breath, mood changes.  Positive muscle aches  Objective  Blood pressure 112/80, pulse 97, height 5\' 4"  (1.626 m), weight 131 lb (59.4 kg), SpO2 98 %.   General: No apparent distress alert and oriented x3 mood  and affect normal, dressed appropriately.  HEENT: Pupils equal, extraocular movements intact  Respiratory: Patient's speak in full sentences and does not appear short of breath  Cardiovascular: No lower extremity edema, non tender, no erythema  Skin: Warm dry intact with no signs of infection or rash on extremities or on axial skeleton.  Abdomen: Soft nontender  Neuro: Cranial nerves II through XII are intact, neurovascularly intact in all extremities with 2+ DTRs and 2+ pulses.  Lymph: No lymphadenopathy of posterior or anterior cervical chain or axillae bilaterally.  Gait normal with good balance and coordination.  MSK:  Non tender with full range of motion and good stability and symmetric strength and tone of shoulders, elbows, wrist, hip, knee and ankles bilaterally.  Hypermobility noted   Neck: Inspection mild loss of lordosis. No palpable stepoffs. Negative Spurling's maneuver. Limitation with left-sided rotation and sidebending Grip strength and sensation normal in bilateral hands Strength good C4 to T1 distribution No sensory change to C4 to T1 Negative Hoffman sign bilaterally Reflexes normal  Back Exam:  Inspection: Unremarkable  Motion: Flexion 45 deg, Extension 25 deg, Side Bending to 20 deg bilaterally, Rotation to 25 deg bilaterally  SLR laying: Negative  XSLR laying: Negative  Palpable tenderness: palpation shows tenderness in the paraspinal musculature. FABER: Tender bilaterally. Sensory change: Gross sensation intact to all lumbar and sacral dermatomes.  Reflexes: 2+ at both patellar tendons, 2+ at achilles tendons, Babinski's downgoing.  Strength at foot  Plantar-flexion: 5/5 Dorsi-flexion: 5/5 Eversion: 5/5 Inversion: 5/5  Leg strength  Quad: 5/5 Hamstring: 5/5 Hip flexor: 5/5 Hip abductors: 4/5 but symmetric   Osteopathic findings C2 flexed rotated and side bent right C4 flexed rotated and side bent left C7 flexed rotated and side bent left T3 extended  rotated and side bent right inhaled third rib T9 extended rotated and side bent left L2 flexed rotated and side bent right Sacrum right on right     Impression and Recommendations:     This case required medical decision making of moderate complexity. The above documentation has been reviewed and is accurate and complete Judi Saa, DO       Note: This dictation was prepared with Dragon dictation along with smaller phrase technology. Any transcriptional errors that result from this process are unintentional.

## 2018-11-20 ENCOUNTER — Encounter: Payer: Self-pay | Admitting: Family Medicine

## 2018-11-20 ENCOUNTER — Ambulatory Visit: Payer: No Typology Code available for payment source | Admitting: Family Medicine

## 2018-11-20 VITALS — BP 112/80 | HR 97 | Ht 64.0 in | Wt 131.0 lb

## 2018-11-20 DIAGNOSIS — M999 Biomechanical lesion, unspecified: Secondary | ICD-10-CM

## 2018-11-20 DIAGNOSIS — M940 Chondrocostal junction syndrome [Tietze]: Secondary | ICD-10-CM

## 2018-11-20 NOTE — Patient Instructions (Signed)
Good to see you Firm, memory foam non countour pillow  See me again in 4-5 weeks

## 2018-11-20 NOTE — Assessment & Plan Note (Signed)
Decision today to treat with OMT was based on Physical Exam  After verbal consent patient was treated with HVLA, ME, FPR techniques in cervical, thoracic, rrib, lumbar and sacral areas  Patient tolerated the procedure well with improvement in symptoms  Patient given exercises, stretches and lifestyle modifications  See medications in patient instructions if given  Patient will follow up in 4-8 weeks

## 2018-11-20 NOTE — Assessment & Plan Note (Signed)
Slipped rib syndrome.  Discussed icing regimen and home exercises.  Discussed posture and ergonomics.  Discussed working Geologist, engineering at work.  Follow-up again in 4 to 8 weeks

## 2018-12-22 NOTE — Progress Notes (Signed)
[name] [address] [place], [place] [address] Phone: [phone] Subjective:   I [name] am serving as a Neurosurgeon for Dr. Antoine Primas.  I'm seeing this patient by the request  of:    CC: Back pain follow-up  NAT:[id number]  Machele Humenik is a 28 y.o. female coming in with complaint of back pain. Lower back feels like a pinched nerve with radiating pain down her right leg.  Patient states that her neck pain seems to be stable.  Has had some more increased stress at work recently.       Past Medical History:  Diagnosis Date  . Anemia   . Hyperlipidemia    Past Surgical History:  Procedure Laterality Date  . WISDOM TOOTH EXTRACTION     Social History   Socioeconomic History  . Marital status: Single    Spouse name: Not on file  . Number of children: Not on file  . Years of education: Not on file  . Highest education level: Not on file  Occupational History  . Not on file  Social Needs  . Financial resource strain: Not on file  . Food insecurity:    Worry: Not on file    Inability: Not on file  . Transportation needs:    Medical: Not on file    Non-medical: Not on file  Tobacco Use  . Smoking status: Never Smoker  . Smokeless tobacco: Never Used  Substance and Sexual Activity  . Alcohol use: Yes  . Drug use: No  . Sexual activity: Not on file  Lifestyle  . Physical activity:    Days per week: Not on file    Minutes per session: Not on file  . Stress: Not on file  Relationships  . Social connections:    Talks on phone: Not on file    Gets together: Not on file    Attends religious service: Not on file    Active member of club or organization: Not on file    Attends meetings of clubs or organizations: Not on file    Relationship status: Not on file  Other Topics Concern  . Not on file  Social History Narrative  . Not on file   Allergies  Allergen Reactions  . Doxycycline Other (See Comments)    Decreased  WBC, Neutropenia   Family History  Problem Relation Age of Onset  . Diabetes Mother   . Autoimmune disease Mother   . Hyperlipidemia Father   . Cancer Maternal Grandmother        breast  . Breast cancer Maternal Grandmother   . Cancer Paternal Grandmother        breast  . Heart disease Paternal Grandmother   . Breast cancer Paternal Grandmother     Current Outpatient Medications (Endocrine & Metabolic):  .  etonogestrel-ethinyl estradiol (NUVARING) 0.12-0.015 MG/24HR vaginal ring, Place 1 each vaginally every 28 (twenty-eight) days. Insert vaginally and leave in place for 3 consecutive weeks, then remove for 1 week.   Current Outpatient Medications (Respiratory):  .  albuterol (PROVENTIL HFA;VENTOLIN HFA) 108 (90 Base) MCG/ACT inhaler, Inhale 2 puffs into the lungs every 4 (four) hours as needed for wheezing or shortness of breath (cough, shortness of breath or wheezing.). [name]  fluticasone (FLONASE) 50 MCG/ACT nasal spray, Place 2 sprays into both nostrils daily.  Current Outpatient Medications (Analgesics):  .  ibuprofen (ADVIL,MOTRIN) 600 MG tablet, Take 1 tablet (600 mg total) by mouth every 8 (eight)  hours as needed.  Current Outpatient Medications (Hematological):  .  ferrous sulfate 325 (65 FE) MG EC tablet, Take 325 mg by mouth daily.   Current Outpatient Medications (Other):  .  ketoconazole (NIZORAL) 2 % shampoo, Apply 1 application topically 2 (two) times a week.    Past medical history, social, surgical and family history all reviewed in electronic medical record.  No pertanent information unless stated regarding to the chief complaint.   Review of Systems:  No headache, visual changes, nausea, vomiting, diarrhea, constipation, dizziness, abdominal pain, skin rash, fevers, chills, night sweats, weight loss, swollen lymph nodes, body aches, joint swelling, muscle aches, chest pain, shortness of breath, mood changes.   Objective  Blood pressure 120/80, pulse (!) 113,  height 5\' 4"  (1.626 m), weight 130 lb (59 kg), SpO2 98 %.    General: No apparent distress alert and oriented x3 mood and affect normal, dressed appropriately.  HEENT: Pupils equal, extraocular movements intact  Respiratory: Patient's speak in full sentences and does not appear short of breath  Cardiovascular: No lower extremity edema, non tender, no erythema  Skin: Warm dry intact with no signs of infection or rash on extremities or on axial skeleton.  Abdomen: Soft nontender  Neuro: Cranial nerves II through XII are intact, neurovascularly intact in all extremities with 2+ DTRs and 2+ pulses.  Lymph: No lymphadenopathy of posterior or anterior cervical chain or axillae bilaterally.  Gait normal with good balance and coordination.  MSK:  Non tender with full range of motion and good stability and symmetric strength and tone of shoulders, elbows, wrist, hip, knee and ankles bilaterally.  Mobility noted Back Exam:  Inspection: Unremarkable  Motion: Flexion 40 deg, Extension 25 deg, Side Bending to 45 deg bilaterally,  Rotation to 45 deg bilaterally  SLR laying: Negative  XSLR laying: Negative  Palpable tenderness: Tender to palpation of the paraspinal musculature lumbar spine right greater than left. FABER: Positive Faber right. Sensory change: Gross sensation intact to all lumbar and sacral dermatomes.  Reflexes: 2+ at both patellar tendons, 2+ at achilles tendons, Babinski's downgoing.  Strength at foot  Plantar-flexion: 5/5 Dorsi-flexion: 5/5 Eversion: 5/5 Inversion: 5/5  Leg strength  Quad: 5/5 Hamstring: 5/5 Hip flexor: 5/5 Hip abductors: 5/5   Osteopathic findings C2 flexed rotated and side bent right C6 flexed rotated and side bent left T3 extended rotated and side bent right inhaled third rib T9 extended rotated and side bent left L2 flexed rotated and side bent right Sacrum right on right    Impression and Recommendations:     This case required medical decision making  of moderate complexity. The above documentation has been reviewed and is accurate and complete Judi Saa, DO       Note: This dictation was prepared with Dragon dictation along with smaller phrase technology. Any transcriptional errors that result from this process are unintentional.

## 2018-12-23 ENCOUNTER — Encounter: Payer: Self-pay | Admitting: Family Medicine

## 2018-12-23 ENCOUNTER — Ambulatory Visit: Payer: No Typology Code available for payment source | Admitting: Family Medicine

## 2018-12-23 VITALS — BP 120/80 | HR 113 | Ht 64.0 in | Wt 130.0 lb

## 2018-12-23 DIAGNOSIS — M546 Pain in thoracic spine: Secondary | ICD-10-CM | POA: Diagnosis not present

## 2018-12-23 DIAGNOSIS — G8929 Other chronic pain: Secondary | ICD-10-CM

## 2018-12-23 DIAGNOSIS — M999 Biomechanical lesion, unspecified: Secondary | ICD-10-CM

## 2018-12-23 NOTE — Assessment & Plan Note (Signed)
Decision today to treat with OMT was based on Physical Exam  After verbal consent patient was treated with HVLA, ME, FPR techniques in cervical, thoracic, rib, lumbar and sacral areas  Patient tolerated the procedure well with improvement in symptoms  Patient given exercises, stretches and lifestyle modifications  See medications in patient instructions if given  Patient will follow up in 4 weeks 

## 2018-12-23 NOTE — Assessment & Plan Note (Signed)
Back pain, more of a spasm, different than previous exam, no significant radiation noted today but patient does give history of some radicular symptoms over the course of the week.  Responding well to manipulation.  Discussed core strengthening hip abductor strengthening.  Discussed stress relieving factors as well.  Follow-up again in 4 weeks

## 2018-12-23 NOTE — Patient Instructions (Signed)
Good to see you  You should do well  If any spasm try ibuprofen  Find time for yourself a little.  See me again in 4 weeks but if doing well can push it to 2 months

## 2018-12-25 ENCOUNTER — Ambulatory Visit: Payer: No Typology Code available for payment source | Admitting: Family Medicine

## 2019-01-30 ENCOUNTER — Ambulatory Visit: Payer: No Typology Code available for payment source | Admitting: Family Medicine

## 2019-02-06 ENCOUNTER — Other Ambulatory Visit: Payer: Self-pay | Admitting: Internal Medicine

## 2019-02-06 DIAGNOSIS — J069 Acute upper respiratory infection, unspecified: Secondary | ICD-10-CM

## 2019-02-06 NOTE — Telephone Encounter (Addendum)
Patient has changed PCP's to Dr. Lawerance Bach and is no longer seeing Dr. Alphonsus Sias.  R/X denied.

## 2019-02-07 ENCOUNTER — Other Ambulatory Visit: Payer: Self-pay | Admitting: Internal Medicine

## 2019-02-07 DIAGNOSIS — J069 Acute upper respiratory infection, unspecified: Secondary | ICD-10-CM

## 2019-02-10 ENCOUNTER — Other Ambulatory Visit: Payer: Self-pay

## 2019-02-10 ENCOUNTER — Encounter: Payer: Self-pay | Admitting: Internal Medicine

## 2019-02-10 DIAGNOSIS — J069 Acute upper respiratory infection, unspecified: Secondary | ICD-10-CM

## 2019-02-10 MED ORDER — FLUTICASONE PROPIONATE 50 MCG/ACT NA SUSP: 2.0000 | Freq: Every day | NASAL | 0 refills | Status: DC

## 2019-02-10 MED FILL — FLUTICASONE PROP 50 MCG SPR: 50 | 30 days supply | Qty: 16 | Fill #0

## 2019-02-13 NOTE — Progress Notes (Signed)
Tawana ScaleZach Smith D.O. [facility] [address] [place], [place] [address] Phone: [phone] Subjective:     Stacy EasternI,[name], am serving as a scribe for Dr. Antoine PrimasZachary Smith.  CC: Back pain follow-up  PPI:RJJOACZYSAHPI:Subjective  Stacy [name] is a 28 y.o. female coming in with complaint of back pain. States that she had a lower back pinch that got better. Neck has been bothering her this past weekend. Patient has been working at home on a more regular basis.  Feels that patient is causing more discomfort.  Does not have a home office.  Patient states that morning the pain at the base of the neck going along the shoulder blades.  Otherwise no new symptoms    Past Medical History:  Diagnosis Date  . Anemia   . Hyperlipidemia    Past Surgical History:  Procedure Laterality Date  . WISDOM TOOTH EXTRACTION     Social History   Socioeconomic History  . Marital status: Single    Spouse name: Not on file  . Number of children: Not on file  . Years of education: Not on file  . Highest education level: Not on file  Occupational History  . Not on file  Social Needs  . Financial resource strain: Not on file  . Food insecurity:    Worry: Not on file    Inability: Not on file  . Transportation needs:    Medical: Not on file    Non-medical: Not on file  Tobacco Use  . Smoking status: Never Smoker  . Smokeless tobacco: Never Used  Substance and Sexual Activity  . Alcohol use: Yes  . Drug use: No  . Sexual activity: Not on file  Lifestyle  . Physical activity:    Days per week: Not on file    Minutes per session: Not on file  . Stress: Not on file  Relationships  . Social connections:    Talks on phone: Not on file    Gets together: Not on file    Attends religious service: Not on file    Active member of club or organization: Not on file    Attends meetings of clubs or organizations: Not on file    Relationship status: Not on file  Other Topics Concern  . Not on file  Social  History Narrative  . Not on file   Allergies  Allergen Reactions  . Doxycycline Other (See Comments)    Decreased WBC, Neutropenia   Family History  Problem Relation Age of Onset  . Diabetes Mother   . Autoimmune disease Mother   . Hyperlipidemia Father   . Cancer Maternal Grandmother        breast  . Breast cancer Maternal Grandmother   . Cancer Paternal Grandmother        breast  . Heart disease Paternal Grandmother   . Breast cancer Paternal Grandmother     Current Outpatient Medications (Endocrine & Metabolic):  .  etonogestrel-ethinyl estradiol (NUVARING) 0.12-0.015 MG/24HR vaginal ring, Place 1 each vaginally every 28 (twenty-eight) days. Insert vaginally and leave in place for 3 consecutive weeks, then remove for 1 week.   Current Outpatient Medications (Respiratory):  .  albuterol (PROVENTIL HFA;VENTOLIN HFA) 108 (90 Base) MCG/ACT inhaler, Inhale 2 puffs into the lungs every 4 (four) hours as needed for wheezing or shortness of breath (cough, shortness of breath or wheezing.). [name].  fluticasone (FLONASE) 50 MCG/ACT nasal spray, Place 2 sprays into both nostrils daily.  Current Outpatient Medications (Analgesics):  .  ibuprofen (ADVIL,MOTRIN) 600 MG tablet, Take 1 tablet (600 mg total) by mouth every 8 (eight) hours as needed.  Current Outpatient Medications (Hematological):  .  ferrous sulfate 325 (65 FE) MG EC tablet, Take 325 mg by mouth daily.   Current Outpatient Medications (Other):  .  ketoconazole (NIZORAL) 2 % shampoo, Apply 1 application topically 2 (two) times a week.    Past medical history, social, surgical and family history all reviewed in electronic medical record.  No pertanent information unless stated regarding to the chief complaint.   Review of Systems:  No headache, visual changes, nausea, vomiting, diarrhea, constipation, dizziness, abdominal pain, skin rash, fevers, chills, night sweats, weight loss, swollen lymph nodes, body aches, joint  swelling, muscle aches, chest pain, shortness of breath, mood changes.   Objective  There were no vitals taken for this visit. Systems examined below as of    General: No apparent distress alert and oriented x3 mood and affect normal, dressed appropriately.  HEENT: Pupils equal, extraocular movements intact  Respiratory: Patient's speak in full sentences and does not appear short of breath  Cardiovascular: No lower extremity edema, non tender, no erythema  Skin: Warm dry intact with no signs of infection or rash on extremities or on axial skeleton.  Abdomen: Soft nontender  Neuro: Cranial nerves II through XII are intact, neurovascularly intact in all extremities with 2+ DTRs and 2+ pulses.  Lymph: No lymphadenopathy of posterior or anterior cervical chain or axillae bilaterally.  Gait normal with good balance and coordination.  MSK:  Non tender with full range of motion and good stability and symmetric strength and tone of shoulders, elbows, wrist, hip, knee and ankles bilaterally.  Benign hypermobility noted Neck: Inspection loss of lordosis. No palpable stepoffs. Negative Spurling's maneuver. Limited range of motion lacking last 5 to 10 degrees of extension and sidebending Grip strength and sensation normal in bilateral hands Strength good C4 to T1 distribution No sensory change to C4 to T1 Negative Hoffman sign bilaterally Reflexes normal  Back Exam:  Inspection: Unremarkable  Motion: Flexion 45 deg, Extension 45 deg, Side Bending to 45 deg bilaterally,  Rotation to 45 deg bilaterally  SLR laying: Negative  XSLR laying: Negative  Palpable tenderness: Mild tenderness to palpation of the right sacroiliac joint. FABER: negative. Sensory change: Gross sensation intact to all lumbar and sacral dermatomes.  Reflexes: 2+ at both patellar tendons, 2+ at achilles tendons, Babinski's downgoing.  Strength at foot  Plantar-flexion: 5/5 Dorsi-flexion: 5/5 Eversion: 5/5 Inversion: 5/5   Leg strength  Quad: 5/5 Hamstring: 5/5 Hip flexor: 5/5 Hip abductors: 5/5  Gait unremarkable.  Osteopathic findings  C2 flexed rotated and side bent right C4 flexed rotated and side bent left C6 flexed rotated and side bent left T3 extended rotated and side bent right inhaled third rib T9 extended rotated and side bent left L2 flexed rotated and side bent right Sacrum right on right    Impression and Recommendations:     This case required medical decision making of moderate complexity. The above documentation has been reviewed and is accurate and complete Judi Saa, DO       Note: This dictation was prepared with Dragon dictation along with smaller phrase technology. Any transcriptional errors that result from this process are unintentional.

## 2019-02-16 ENCOUNTER — Other Ambulatory Visit: Payer: Self-pay

## 2019-02-16 ENCOUNTER — Ambulatory Visit: Payer: No Typology Code available for payment source | Admitting: Family Medicine

## 2019-02-16 ENCOUNTER — Encounter: Payer: Self-pay | Admitting: Family Medicine

## 2019-02-16 VITALS — BP 126/84 | HR 105 | Ht 64.0 in | Wt 128.0 lb

## 2019-02-16 DIAGNOSIS — M999 Biomechanical lesion, unspecified: Secondary | ICD-10-CM

## 2019-02-16 DIAGNOSIS — M940 Chondrocostal junction syndrome [Tietze]: Secondary | ICD-10-CM

## 2019-02-16 NOTE — Assessment & Plan Note (Signed)
Decision today to treat with OMT was based on Physical Exam  After verbal consent patient was treated with HVLA, ME, FPR techniques in cervical, thoracic rib, , lumbar and sacral areas  Patient tolerated the procedure well with improvement in symptoms  Patient given exercises, stretches and lifestyle modifications  See medications in patient instructions if given  Patient will follow up in 4-8 weeks 

## 2019-02-16 NOTE — Assessment & Plan Note (Signed)
Patient continues to work on posture.  Responds well to osteopathic manipulation.  Discussed icing regimen and home exercise.  Discussed which activities to do which wants to avoid.  Follow-up in 4 to 8 weeks

## 2019-02-16 NOTE — Patient Instructions (Addendum)
Good o see you  Keep working on posture Be safe  Iron 65mg  with 500mg  of vitamin C daily  See me again in 4ish weeks

## 2019-02-19 ENCOUNTER — Encounter: Payer: No Typology Code available for payment source | Admitting: Internal Medicine

## 2019-02-25 LAB — HM PAP SMEAR

## 2019-02-27 MED FILL — ETONOGESTREL-ETHINYL ESTRAD: 0.12-0.015 | 84 days supply | Qty: 3 | Fill #0

## 2019-03-16 ENCOUNTER — Ambulatory Visit: Payer: No Typology Code available for payment source | Admitting: Family Medicine

## 2019-04-04 NOTE — Progress Notes (Signed)
Tawana ScaleZach Kishan Wachsmuth D.O. [facility] [address] [place], [place] [address] Phone: [phone] Subjective:   I Stacy NighKana Thompson am serving as a Neurosurgeonscribe for Dr. Antoine PrimasZachary Velisa Regnier.   CC: Neck and back pain follow-up  BJY:NWGNFAOZHYHPI:Subjective  Stacy [name] is a 28 y.o. female coming in with complaint of neck and back pain. States she slept wrong a week ago. Believes she is out of alignment. Shoulder blade is also painful.  Patient has noticed more tightness recently.  Given her some discomfort and pain in the back.  Not taking iron and vitamin D on a regular basis.      Past Medical History:  Diagnosis Date  . Anemia   . Hyperlipidemia    Past Surgical History:  Procedure Laterality Date  . WISDOM TOOTH EXTRACTION     Social History   Socioeconomic History  . Marital status: Single    Spouse name: Not on file  . Number of children: Not on file  . Years of education: Not on file  . Highest education level: Not on file  Occupational History  . Not on file  Social Needs  . Financial resource strain: Not on file  . Food insecurity    Worry: Not on file    Inability: Not on file  . Transportation needs    Medical: Not on file    Non-medical: Not on file  Tobacco Use  . Smoking status: Never Smoker  . Smokeless tobacco: Never Used  Substance and Sexual Activity  . Alcohol use: Yes  . Drug use: No  . Sexual activity: Not on file  Lifestyle  . Physical activity    Days per week: Not on file    Minutes per session: Not on file  . Stress: Not on file  Relationships  . Social Musicianconnections    Talks on phone: Not on file    Gets together: Not on file    Attends religious service: Not on file    Active member of club or organization: Not on file    Attends meetings of clubs or organizations: Not on file    Relationship status: Not on file  Other Topics Concern  . Not on file  Social History Narrative  . Not on file   Allergies  Allergen Reactions  . Doxycycline Other (See  Comments)    Decreased WBC, Neutropenia   Family History  Problem Relation Age of Onset  . Diabetes Mother   . Autoimmune disease Mother   . Hyperlipidemia Father   . Cancer Maternal Grandmother        breast  . Breast cancer Maternal Grandmother   . Cancer Paternal Grandmother        breast  . Heart disease Paternal Grandmother   . Breast cancer Paternal Grandmother     Current Outpatient Medications (Endocrine & Metabolic):  .  etonogestrel-ethinyl estradiol (NUVARING) 0.12-0.015 MG/24HR vaginal ring, Place 1 each vaginally every 28 (twenty-eight) days. Insert vaginally and leave in place for 3 consecutive weeks, then remove for 1 week.   Current Outpatient Medications (Respiratory):  .  albuterol (PROVENTIL HFA;VENTOLIN HFA) 108 (90 Base) MCG/ACT inhaler, Inhale 2 puffs into the lungs every 4 (four) hours as needed for wheezing or shortness of breath (cough, shortness of breath or wheezing.). [name].  fluticasone (FLONASE) 50 MCG/ACT nasal spray, Place 2 sprays into both nostrils daily.   Current Outpatient Medications (Hematological):  .  ferrous sulfate 325 (65 FE) MG EC tablet, Take 325 mg by  mouth daily.   Current Outpatient Medications (Other):  .  ketoconazole (NIZORAL) 2 % shampoo, Apply 1 application topically 2 (two) times a week. .  Vitamin D, Ergocalciferol, (DRISDOL) 1.25 MG (50000 UT) CAPS capsule, Take 1 capsule (50,000 Units total) by mouth every 7 (seven) days.    Past medical history, social, surgical and family history all reviewed in electronic medical record.  No pertanent information unless stated regarding to the chief complaint.   Review of Systems:  No headache, visual changes, nausea, vomiting, diarrhea, constipation, dizziness, abdominal pain, skin rash, fevers, chills, night sweats, weight loss, swollen lymph nodes, body aches, joint swelling,  chest pain, shortness of breath, mood changes.  Positive muscle aches  Objective  Blood pressure 104/70,  pulse (!) 102, height 5\' 4"  (1.626 m), weight 125 lb (56.7 kg), SpO2 97 %.    General: No apparent distress alert and oriented x3 mood and affect normal, dressed appropriately.  HEENT: Pupils equal, extraocular movements intact  Respiratory: Patient's speak in full sentences and does not appear short of breath  Cardiovascular: No lower extremity edema, non tender, no erythema  Skin: Warm dry intact with no signs of infection or rash on extremities or on axial skeleton.  Abdomen: Soft nontender  Neuro: Cranial nerves II through XII are intact, neurovascularly intact in all extremities with 2+ DTRs and 2+ pulses.  Lymph: No lymphadenopathy of posterior or anterior cervical chain or axillae bilaterally.  Gait normal with good balance and coordination.  MSK:  Non tender with full range of motion and good stability and symmetric strength and tone of shoulders, elbows, wrist, hip, knee and ankles bilaterally.  Hypermobility noted Back Exam:  Inspection: Loss of lordosis Motion: Flexion 45 deg, Extension 25 deg, Side Bending to 45 deg bilaterally,  Rotation to 35 deg bilaterally  SLR laying: Negative  XSLR laying: Negative  Palpable tenderness: Tender to palpation in the thoracolumbar and lumbosacral areas. FABER: Mild tightness right. Sensory change: Gross sensation intact to all lumbar and sacral dermatomes.  Reflexes: 2+ at both patellar tendons, 2+ at achilles tendons, Babinski's downgoing.  Strength at foot  Plantar-flexion: 5/5 Dorsi-flexion: 5/5 Eversion: 5/5 Inversion: 5/5  Leg strength  Quad: 5/5 Hamstring: 5/5 Hip flexor: 5/5 Hip abductors: 5/5  Gait unremarkable.  Osteopathic findings C2 flexed rotated and side bent right C7 flexed rotated and side bent left T3 extended rotated and side bent right inhaled third rib T8 extended rotated and side bent left L3 flexed rotated and side bent right Sacrum right on right    Impression and Recommendations:     This case required  medical decision making of moderate complexity. The above documentation has been reviewed and is accurate and complete [name], DO       Note: This dictation was prepared with Dragon dictation along with smaller phrase technology. Any transcriptional errors that result from this process are unintentional.

## 2019-04-06 ENCOUNTER — Encounter: Payer: Self-pay | Admitting: Family Medicine

## 2019-04-06 ENCOUNTER — Ambulatory Visit: Payer: No Typology Code available for payment source | Admitting: Family Medicine

## 2019-04-06 ENCOUNTER — Other Ambulatory Visit: Payer: Self-pay

## 2019-04-06 VITALS — BP 104/70 | HR 102 | Ht 64.0 in | Wt 125.0 lb

## 2019-04-06 DIAGNOSIS — M999 Biomechanical lesion, unspecified: Secondary | ICD-10-CM

## 2019-04-06 DIAGNOSIS — M940 Chondrocostal junction syndrome [Tietze]: Secondary | ICD-10-CM | POA: Diagnosis not present

## 2019-04-06 MED ORDER — VITAMIN D (ERGOCALCIFEROL) 1.25 MG (50000 UNIT) PO CAPS: 50000.0000 [IU] | ORAL_CAPSULE | ORAL | 0 refills | Status: DC

## 2019-04-06 MED FILL — VIT D2 1.25 MG (50,000 UNIT: 1.25 MG | 84 days supply | Qty: 12 | Fill #0

## 2019-04-06 NOTE — Patient Instructions (Addendum)
Good to see you.  Remember the tennis ball Once weekly vitamin D for 12 weeks.  See me again in 6-8 weeks

## 2019-04-06 NOTE — Assessment & Plan Note (Signed)
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  Continues to respond well to osteopathic manipulation.  Encourage patient to continue to work on home exercises, icing regimen, discussed which activities to potentially avoid.  Discussed ergonomics with patient working more at home.  Follow-up again in 6 to 8 weeks

## 2019-04-06 NOTE — Assessment & Plan Note (Signed)
Decision today to treat with OMT was based on Physical Exam  After verbal consent patient was treated with HVLA, ME, FPR techniques in cervical, thoracic, rib lumbar and sacral areas  Patient tolerated the procedure well with improvement in symptoms  Patient given exercises, stretches and lifestyle modifications  See medications in patient instructions if given  Patient will follow up in 6-8 weeks 

## 2019-04-07 ENCOUNTER — Encounter: Payer: Self-pay | Admitting: Family Medicine

## 2019-04-15 NOTE — Patient Instructions (Addendum)
Tests ordered today. Your results will be released to MyChart (or called to you) after review, usually within 72hours after test completion. If any changes need to be made, you will be notified at that same time.  All other Health Maintenance issues reviewed.   All recommended immunizations and age-appropriate screenings are up-to-date or discussed.  No immunizations administered today.   Medications reviewed and updated.  Changes include :   none   Please followup in one year    Health Maintenance, Female Adopting a healthy lifestyle and getting preventive care are important in promoting health and wellness. Ask your health care provider about:  The right schedule for you to have regular tests and exams.  Things you can do on your own to prevent diseases and keep yourself healthy. What should I know about diet, weight, and exercise? Eat a healthy diet   Eat a diet that includes plenty of vegetables, fruits, low-fat dairy products, and lean protein.  Do not eat a lot of foods that are high in solid fats, added sugars, or sodium. Maintain a healthy weight Body mass index (BMI) is used to identify weight problems. It estimates body fat based on height and weight. Your health care provider can help determine your BMI and help you achieve or maintain a healthy weight. Get regular exercise Get regular exercise. This is one of the most important things you can do for your health. Most adults should:  Exercise for at least 150 minutes each week. The exercise should increase your heart rate and make you sweat (moderate-intensity exercise).  Do strengthening exercises at least twice a week. This is in addition to the moderate-intensity exercise.  Spend less time sitting. Even light physical activity can be beneficial. Watch cholesterol and blood lipids Have your blood tested for lipids and cholesterol at 28 years of age, then have this test every 5 years. Have your cholesterol levels  checked more often if:  Your lipid or cholesterol levels are high.  You are older than 28 years of age.  You are at high risk for heart disease. What should I know about cancer screening? Depending on your health history and family history, you may need to have cancer screening at various ages. This may include screening for:  Breast cancer.  Cervical cancer.  Colorectal cancer.  Skin cancer.  Lung cancer. What should I know about heart disease, diabetes, and high blood pressure? Blood pressure and heart disease  High blood pressure causes heart disease and increases the risk of stroke. This is more likely to develop in people who have high blood pressure readings, are of African descent, or are overweight.  Have your blood pressure checked: ? Every 3-5 years if you are 18-39 years of age. ? Every year if you are 40 years old or older. Diabetes Have regular diabetes screenings. This checks your fasting blood sugar level. Have the screening done:  Once every three years after age 40 if you are at a normal weight and have a low risk for diabetes.  More often and at a younger age if you are overweight or have a high risk for diabetes. What should I know about preventing infection? Hepatitis B If you have a higher risk for hepatitis B, you should be screened for this virus. Talk with your health care provider to find out if you are at risk for hepatitis B infection. Hepatitis C Testing is recommended for:  Everyone born from 1945 through 1965.  Anyone with known risk   factors for hepatitis C. Sexually transmitted infections (STIs)  Get screened for STIs, including gonorrhea and chlamydia, if: ? You are sexually active and are younger than 28 years of age. ? You are older than 28 years of age and your health care provider tells you that you are at risk for this type of infection. ? Your sexual activity has changed since you were last screened, and you are at increased risk  for chlamydia or gonorrhea. Ask your health care provider if you are at risk.  Ask your health care provider about whether you are at high risk for HIV. Your health care provider may recommend a prescription medicine to help prevent HIV infection. If you choose to take medicine to prevent HIV, you should first get tested for HIV. You should then be tested every 3 months for as long as you are taking the medicine. Pregnancy  If you are about to stop having your period (premenopausal) and you may become pregnant, seek counseling before you get pregnant.  Take 400 to 800 micrograms (mcg) of folic acid every day if you become pregnant.  Ask for birth control (contraception) if you want to prevent pregnancy. Osteoporosis and menopause Osteoporosis is a disease in which the bones lose minerals and strength with aging. This can result in bone fractures. If you are 65 years old or older, or if you are at risk for osteoporosis and fractures, ask your health care provider if you should:  Be screened for bone loss.  Take a calcium or vitamin D supplement to lower your risk of fractures.  Be given hormone replacement therapy (HRT) to treat symptoms of menopause. Follow these instructions at home: Lifestyle  Do not use any products that contain nicotine or tobacco, such as cigarettes, e-cigarettes, and chewing tobacco. If you need help quitting, ask your health care provider.  Do not use street drugs.  Do not share needles.  Ask your health care provider for help if you need support or information about quitting drugs. Alcohol use  Do not drink alcohol if: ? Your health care provider tells you not to drink. ? You are pregnant, may be pregnant, or are planning to become pregnant.  If you drink alcohol: ? Limit how much you use to 0-1 drink a day. ? Limit intake if you are breastfeeding.  Be aware of how much alcohol is in your drink. In the [place] one drink equals one 12 oz bottle of beer (355  mL), one 5 oz glass of wine (148 mL), or one 1 oz glass of hard liquor (44 mL). General instructions  Schedule regular health, dental, and eye exams.  Stay current with your vaccines.  Tell your health care provider if: ? You often feel depressed. ? You have ever been abused or do not feel safe at home. Summary  Adopting a healthy lifestyle and getting preventive care are important in promoting health and wellness.  Follow your health care provider's instructions about healthy diet, exercising, and getting tested or screened for diseases.  Follow your health care provider's instructions on monitoring your cholesterol and blood pressure. This information is not intended to replace advice given to you by your health care provider. Make sure you discuss any questions you have with your health care provider. Document Released: 04/16/2011 Document Revised: 09/24/2018 Document Reviewed: 09/24/2018 Elsevier Patient Education  2020 [employer]  

## 2019-04-15 NOTE — Progress Notes (Signed)
Subjective:    Patient ID: Stacy [name], female    DOB: 05/23/1991, 28 y.o.   MRN: [id number]  HPI She is here for a physical exam.   She wonders about vitamin d level - she was put on high dose vitamin d by Dr Katrinka BlazingSmith.  She is developing a varicose vein in her RLE.  It is tender on occasion.    She has a mole on her back she wants looked at.    She was at the dentist on Monday and her gums are still irritated and it usually does not last that long.   Medications and allergies reviewed with patient and updated if appropriate.  Patient Active Problem List   Diagnosis Date Noted  . Slipped rib syndrome 06/19/2018  . Nonallopathic lesion of sacral region 06/19/2018  . Nonallopathic lesion of rib cage 06/19/2018  . Palpitations 02/11/2018  . Hypermobility syndrome 03/25/2017  . Nonallopathic lesion of thoracic region 03/25/2017  . Nonallopathic lesion of cervical region 03/25/2017  . Nonallopathic lesion of lumbosacral region 03/25/2017  . Back pain 01/30/2017  . Dandruff 01/23/2016  . Iron deficiency anemia 01/23/2016    Current Outpatient Medications on File Prior to Visit  Medication Sig Dispense Refill  . albuterol (PROVENTIL HFA;VENTOLIN HFA) 108 (90 Base) MCG/ACT inhaler Inhale 2 puffs into the lungs every 4 (four) hours as needed for wheezing or shortness of breath (cough, shortness of breath or wheezing.). 1 Inhaler 1  . etonogestrel-ethinyl estradiol (NUVARING) 0.12-0.015 MG/24HR vaginal ring Place 1 each vaginally every 28 (twenty-eight) days. Insert vaginally and leave in place for 3 consecutive weeks, then remove for 1 week.    . ferrous sulfate 325 (65 FE) MG EC tablet Take 325 mg by mouth daily.     . fluticasone (FLONASE) 50 MCG/ACT nasal spray Place 2 sprays into both nostrils daily. 16 g 0  . ketoconazole (NIZORAL) 2 % shampoo Apply 1 application topically 2 (two) times a week. 120 mL 11  . Vitamin D, Ergocalciferol, (DRISDOL) 1.25 MG (50000 UT) CAPS capsule  Take 1 capsule (50,000 Units total) by mouth every 7 (seven) days. 12 capsule 0   No current facility-administered medications on file prior to visit.     Past Medical History:  Diagnosis Date  . Anemia   . Hyperlipidemia     Past Surgical History:  Procedure Laterality Date  . WISDOM TOOTH EXTRACTION      Social History   Socioeconomic History  . Marital status: Single    Spouse name: Not on file  . Number of children: Not on file  . Years of education: Not on file  . Highest education level: Not on file  Occupational History  . Not on file  Social Needs  . Financial resource strain: Not on file  . Food insecurity    Worry: Not on file    Inability: Not on file  . Transportation needs    Medical: Not on file    Non-medical: Not on file  Tobacco Use  . Smoking status: Never Smoker  . Smokeless tobacco: Never Used  Substance and Sexual Activity  . Alcohol use: Yes  . Drug use: No  . Sexual activity: Not on file  Lifestyle  . Physical activity    Days per week: Not on file    Minutes per session: Not on file  . Stress: Not on file  Relationships  . Social connections    Talks on phone: Not on file  Gets together: Not on file    Attends religious service: Not on file    Active member of club or organization: Not on file    Attends meetings of clubs or organizations: Not on file    Relationship status: Not on file  Other Topics Concern  . Not on file  Social History Narrative  . Not on file    Family History  Problem Relation Age of Onset  . Diabetes Mother   . Autoimmune disease Mother   . Hyperlipidemia Father   . Cancer Maternal Grandmother        breast  . Breast cancer Maternal Grandmother   . Cancer Paternal Grandmother        breast  . Heart disease Paternal Grandmother   . Breast cancer Paternal Grandmother     Review of Systems  Constitutional: Negative for chills and fever.  Eyes: Negative for visual disturbance.  Respiratory:  Negative for cough and shortness of breath.   Cardiovascular: Negative for chest pain, palpitations and leg swelling.  Gastrointestinal: Positive for abdominal pain (occ RUQ dull pain). Negative for blood in stool, constipation, diarrhea and nausea.       Rare gerd  Genitourinary: Negative for dysuria and hematuria.  Musculoskeletal: Negative for arthralgias and back pain.  Skin: Negative for color change and rash.  Neurological: Negative for light-headedness and headaches.  Psychiatric/Behavioral: Negative for dysphoric mood. The patient is not nervous/anxious.        Objective:   Vitals:   04/16/19 0744  BP: 120/82  Pulse: 90  Resp: 16  Temp: 98.6 F (37 C)  SpO2: 99%   Filed Weights   04/16/19 0744  Weight: 126 lb 12.8 oz (57.5 kg)   Body mass index is 21.77 kg/m.  BP Readings from Last 3 Encounters:  04/16/19 120/82  04/06/19 104/70  02/16/19 126/84    Wt Readings from Last 3 Encounters:  04/16/19 126 lb 12.8 oz (57.5 kg)  04/06/19 125 lb (56.7 kg)  02/16/19 128 lb (58.1 kg)     Physical Exam Constitutional: She appears well-developed and well-nourished. No distress.  HENT:  Head: Normocephalic and atraumatic.  Right Ear: External ear normal. Normal ear canal and TM Left Ear: External ear normal.  Normal ear canal and TM Mouth/Throat: Oropharynx is clear and moist.  Eyes: Conjunctivae and EOM are normal.  Neck: Neck supple. No tracheal deviation present. No thyromegaly present.  No carotid bruit  Cardiovascular: Normal rate, regular rhythm and normal heart sounds.  No murmur heard.  No edema. Pulmonary/Chest: Effort normal and breath sounds normal. No respiratory distress. She has no wheezes. She has no rales.  Breast: deferred   Abdominal: Soft. She exhibits no distension. There is no tenderness.  Lymphadenopathy: She has no cervical adenopathy.  Skin: Skin is warm and dry. She is not diaphoretic. Benign appearing cyst on central - mid-lower back  Psychiatric: She has a normal mood and affect. Her behavior is normal.        Assessment & Plan:   Physical exam: Screening blood work ordered Immunizations Up to date Gyn   Up to date  Exercise    Not regularly - will get back into regular exercise Weight  Normal bmi Skin  No concerning moles Substance abuse  none Supplements - after completing high dose vitamin d - start 1000 units of vitamin d daily  See Problem List for Assessment and Plan of chronic medical problems.

## 2019-04-16 ENCOUNTER — Ambulatory Visit (INDEPENDENT_AMBULATORY_CARE_PROVIDER_SITE_OTHER): Payer: No Typology Code available for payment source | Admitting: Internal Medicine

## 2019-04-16 ENCOUNTER — Other Ambulatory Visit: Payer: Self-pay

## 2019-04-16 ENCOUNTER — Other Ambulatory Visit (INDEPENDENT_AMBULATORY_CARE_PROVIDER_SITE_OTHER): Payer: No Typology Code available for payment source

## 2019-04-16 ENCOUNTER — Encounter: Payer: Self-pay | Admitting: Internal Medicine

## 2019-04-16 VITALS — BP 120/82 | HR 90 | Temp 98.6°F | Resp 16 | Ht 64.0 in | Wt 126.8 lb

## 2019-04-16 DIAGNOSIS — Z0000 Encounter for general adult medical examination without abnormal findings: Secondary | ICD-10-CM

## 2019-04-16 DIAGNOSIS — R1011 Right upper quadrant pain: Secondary | ICD-10-CM | POA: Insufficient documentation

## 2019-04-16 LAB — COMPREHENSIVE METABOLIC PANEL
ALT: 7 U/L (ref 0–35)
AST: 15 U/L (ref 0–37)
Albumin: 4.1 g/dL (ref 3.5–5.2)
Alkaline Phosphatase: 36 U/L — ABNORMAL LOW (ref 39–117)
BUN: 7 mg/dL (ref 6–23)
CO2: 26 mEq/L (ref 19–32)
Calcium: 9.3 mg/dL (ref 8.4–10.5)
Chloride: 105 mEq/L (ref 96–112)
Creatinine, Ser: 0.79 mg/dL (ref 0.40–1.20)
GFR: 104.86 mL/min (ref >60.00)
Glucose, Bld: 79 mg/dL (ref 70–99)
Potassium: 4 mEq/L (ref 3.5–5.1)
Sodium: 140 mEq/L (ref 135–145)
Total Bilirubin: 0.5 mg/dL (ref 0.2–1.2)
Total Protein: 7.4 g/dL (ref 6.0–8.3)

## 2019-04-16 LAB — CBC WITH DIFFERENTIAL/PLATELET
Basophils Absolute: 0 10*3/uL (ref 0.0–0.1)
Basophils Relative: 1.2 % (ref 0.0–3.0)
Eosinophils Absolute: 0.1 10*3/uL (ref 0.0–0.7)
Eosinophils Relative: 4.6 % (ref 0.0–5.0)
HCT: 37.7 % (ref 36.0–46.0)
Hemoglobin: 12.5 g/dL (ref 12.0–15.0)
Lymphocytes Relative: 46.4 % — ABNORMAL HIGH (ref 12.0–46.0)
Lymphs Abs: 1.4 10*3/uL (ref 0.7–4.0)
MCHC: 33.1 g/dL (ref 30.0–36.0)
MCV: 86 fl (ref 78.0–100.0)
Monocytes Absolute: 0.3 10*3/uL (ref 0.1–1.0)
Monocytes Relative: 10.1 % (ref 3.0–12.0)
Neutro Abs: 1.2 10*3/uL — ABNORMAL LOW (ref 1.4–7.7)
Neutrophils Relative %: 37.7 % — ABNORMAL LOW (ref 43.0–77.0)
Platelets: 291 10*3/uL (ref 150.0–400.0)
RBC: 4.38 Mil/uL (ref 3.87–5.11)
RDW: 17.1 % — ABNORMAL HIGH (ref 11.5–15.5)
WBC: 3.1 10*3/uL — ABNORMAL LOW (ref 4.0–10.5)

## 2019-04-16 LAB — LIPID PANEL
Cholesterol: 213 mg/dL — ABNORMAL HIGH (ref 0–200)
HDL: 79.2 mg/dL (ref >39.00)
LDL Cholesterol: 120 mg/dL — ABNORMAL HIGH (ref 0–99)
NonHDL: 133.83
Total CHOL/HDL Ratio: 3
Triglycerides: 70 mg/dL (ref 0.0–149.0)
VLDL: 14 mg/dL (ref 0.0–40.0)

## 2019-04-16 LAB — TSH: TSH: 1.36 u[IU]/mL (ref 0.35–4.50)

## 2019-04-16 NOTE — Assessment & Plan Note (Signed)
Intermittent - up to 4 times a month ? Related to specific foods or time of day Discussed possible causes - possible GB She will monitor for now Consider [place] if pain increases or continues

## 2019-04-17 ENCOUNTER — Encounter: Payer: Self-pay | Admitting: Internal Medicine

## 2019-04-28 ENCOUNTER — Encounter: Payer: Self-pay | Admitting: Internal Medicine

## 2019-04-28 NOTE — Progress Notes (Signed)
Abstracted and sent to scan  

## 2019-05-16 NOTE — Progress Notes (Signed)
[facility] [address] [place], [place] [address] Phone: [phone] Subjective:   I Kandace Blitz am serving as a Education administrator for Dr. Hulan Saas.  I'm seeing this patient by the request  of:    CC: Neck and back pain follow-up  [id number]  Stacy [name] is a 28 y.o. female coming in with complaint of neck and back pain. Back has been bothering her lately and she states that her back feels "off".  Patient has been doing relatively well but has recently moved.  Was moving a significant number of boxes.  Patient rates the severity of pain is 5 out of 10.  Patient has had stress as well.       Past Medical History:  Diagnosis Date  . Anemia   . Hyperlipidemia    Past Surgical History:  Procedure Laterality Date  . WISDOM TOOTH EXTRACTION     Social History   Socioeconomic History  . Marital status: Single    Spouse name: Not on file  . Number of children: Not on file  . Years of education: Not on file  . Highest education level: Not on file  Occupational History  . Not on file  Social Needs  . Financial resource strain: Not on file  . Food insecurity    Worry: Not on file    Inability: Not on file  . Transportation needs    Medical: Not on file    Non-medical: Not on file  Tobacco Use  . Smoking status: Never Smoker  . Smokeless tobacco: Never Used  Substance and Sexual Activity  . Alcohol use: Yes  . Drug use: No  . Sexual activity: Not on file  Lifestyle  . Physical activity    Days per week: Not on file    Minutes per session: Not on file  . Stress: Not on file  Relationships  . Social Herbalist on phone: Not on file    Gets together: Not on file    Attends religious service: Not on file    Active member of club or organization: Not on file    Attends meetings of clubs or organizations: Not on file    Relationship status: Not on file  Other Topics Concern  . Not on file  Social History Narrative  . Not on  file   Allergies  Allergen Reactions  . Doxycycline Other (See Comments)    Decreased WBC, Neutropenia   Family History  Problem Relation Age of Onset  . Diabetes Mother   . Autoimmune disease Mother   . Hyperlipidemia Father   . Cancer Maternal Grandmother        breast  . Breast cancer Maternal Grandmother   . Cancer Paternal Grandmother        breast  . Heart disease Paternal Grandmother   . Breast cancer Paternal Grandmother     Current Outpatient Medications (Endocrine & Metabolic):  .  etonogestrel-ethinyl estradiol (NUVARING) 0.12-0.015 MG/24HR vaginal ring, Place 1 each vaginally every 28 (twenty-eight) days. Insert vaginally and leave in place for 3 consecutive weeks, then remove for 1 week.   Current Outpatient Medications (Respiratory):  .  albuterol (PROVENTIL HFA;VENTOLIN HFA) 108 (90 Base) MCG/ACT inhaler, Inhale 2 puffs into the lungs every 4 (four) hours as needed for wheezing or shortness of breath (cough, shortness of breath or wheezing.). [name]  fluticasone (FLONASE) 50 MCG/ACT nasal spray, Place 2 sprays into both nostrils daily.   Current  Outpatient Medications (Hematological):  .  ferrous sulfate 325 (65 FE) MG EC tablet, Take 325 mg by mouth daily.   Current Outpatient Medications (Other):  .  ketoconazole (NIZORAL) 2 % shampoo, Apply 1 application topically 2 (two) times a week. .  Vitamin D, Ergocalciferol, (DRISDOL) 1.25 MG (50000 UT) CAPS capsule, Take 1 capsule (50,000 Units total) by mouth every 7 (seven) days. .  traZODone (DESYREL) 50 MG tablet, 1/2-1 tablet at bedtime    Past medical history, social, surgical and family history all reviewed in electronic medical record.  No pertanent information unless stated regarding to the chief complaint.   Review of Systems:  No headache, visual changes, nausea, vomiting, diarrhea, constipation, dizziness, abdominal pain, skin rash, fevers, chills, night sweats, weight loss, swollen lymph nodes, body aches,  joint swelling,  chest pain, shortness of breath, mood changes.  Positive muscle aches  Objective  Blood pressure 124/80, pulse 83, height 5\' 4"  (1.626 m), weight 123 lb (55.8 kg), SpO2 97 %.    General: No apparent distress alert and oriented x3 mood and affect normal, dressed appropriately.  HEENT: Pupils equal, extraocular movements intact  Respiratory: Patient's speak in full sentences and does not appear short of breath  Cardiovascular: No lower extremity edema, non tender, no erythema  Skin: Warm dry intact with no signs of infection or rash on extremities or on axial skeleton.  Abdomen: Soft nontender  Neuro: Cranial nerves II through XII are intact, neurovascularly intact in all extremities with 2+ DTRs and 2+ pulses.  Lymph: No lymphadenopathy of posterior or anterior cervical chain or axillae bilaterally.  Gait normal with good balance and coordination.  MSK:  Non tender with full range of motion and good stability and symmetric strength and tone of shoulders, elbows, wrist, hip, knee and ankles bilaterally.  Back Exam:  Inspection: Unremarkable  Motion: Flexion 35 deg, Extension 25 deg, Side Bending to 35 deg bilaterally,  Rotation to 30 deg bilaterally  SLR laying: Negative  XSLR laying: Negative  Palpable tenderness: Tender to palpation more in the parascapular region left greater than right.[name]. FABER: Mild tightness bilaterally. Sensory change: Gross sensation intact to all lumbar and sacral dermatomes.  Reflexes: 2+ at both patellar tendons, 2+ at achilles tendons, Babinski's downgoing.  Strength at foot  Plantar-flexion: 5/5 Dorsi-flexion: 5/5 Eversion: 5/5 Inversion: 5/5  Leg strength  Quad: 5/5 Hamstring: 5/5 Hip flexor: 5/5 Hip abductors: 5/5  Gait unremarkable.  Osteopathic findings C2 flexed rotated and side bent right C6 flexed rotated and side bent left T3 extended rotated and side bent right inhaled third rib T9 extended rotated and side bent left L2 flexed  rotated and side bent right Sacrum right on right    Impression and Recommendations:     This case required medical decision making of moderate complexity. The above documentation has been reviewed and is accurate and complete Judi SaaZachary M , DO       Note: This dictation was prepared with Dragon dictation along with smaller phrase technology. Any transcriptional errors that result from this process are unintentional.

## 2019-05-16 NOTE — Assessment & Plan Note (Signed)
Stable.  Once again discussed ergonomics.  Discussed scapular exercises that I think will be beneficial.  Encourage patient to continue the vitamin D supplementation.  Follow-up again in 4 to 8 weeks

## 2019-05-16 NOTE — Assessment & Plan Note (Signed)
Decision today to treat with OMT was based on Physical Exam  After verbal consent patient was treated with HVLA, ME, FPR techniques in cervical, thoracic, rib lumbar and sacral areas  Patient tolerated the procedure well with improvement in symptoms  Patient given exercises, stretches and lifestyle modifications  See medications in patient instructions if given  Patient will follow up in 4-8 weeks 

## 2019-05-18 ENCOUNTER — Other Ambulatory Visit: Payer: Self-pay

## 2019-05-18 ENCOUNTER — Ambulatory Visit (INDEPENDENT_AMBULATORY_CARE_PROVIDER_SITE_OTHER): Payer: No Typology Code available for payment source | Admitting: Family Medicine

## 2019-05-18 ENCOUNTER — Encounter: Payer: Self-pay | Admitting: Family Medicine

## 2019-05-18 VITALS — BP 124/80 | HR 83 | Ht 64.0 in | Wt 123.0 lb

## 2019-05-18 DIAGNOSIS — M940 Chondrocostal junction syndrome [Tietze]: Secondary | ICD-10-CM | POA: Diagnosis not present

## 2019-05-18 DIAGNOSIS — M999 Biomechanical lesion, unspecified: Secondary | ICD-10-CM | POA: Diagnosis not present

## 2019-05-18 MED ORDER — TRAZODONE HCL 50 MG PO TABS: ORAL_TABLET | ORAL | 0 refills | Status: DC

## 2019-05-18 NOTE — Patient Instructions (Signed)
Good to see you Congrats on the house! See me again in 4-6 weeks

## 2019-05-19 ENCOUNTER — Other Ambulatory Visit: Payer: Self-pay

## 2019-05-19 ENCOUNTER — Encounter: Payer: Self-pay | Admitting: Internal Medicine

## 2019-05-19 MED ORDER — KETOCONAZOLE 2 % EX SHAM: 1.0000 "application " | MEDICATED_SHAMPOO | CUTANEOUS | 1 refills | Status: DC

## 2019-06-23 ENCOUNTER — Ambulatory Visit: Payer: No Typology Code available for payment source | Admitting: Family Medicine

## 2019-06-24 ENCOUNTER — Ambulatory Visit: Payer: No Typology Code available for payment source | Admitting: Family Medicine

## 2019-07-05 NOTE — Progress Notes (Signed)
Subjective:    Patient ID: Stacy [name], female    DOB: 1991-09-02, 28 y.o.   MRN: [id number]  HPI The patient is here for an acute visit.   Anxiety, Trouble sleeping:  Dr Tamala Julian started her on trazodone early August. It has not helped much - it makes her feel hungover.  She started not sleeping well 3-4 months ago.  She wakes up 2-5 times a night.   She can fall asleep ok.  She has a lot of stress related to her new house.  She had a panic attack the other day- she had chest tightness.  She denies changes in appetite.  She has had some mild weight loss, but believes that is from being so busy.  She has not had any difficulty with concentrating or at work.  She feels there might be some low level of depression as well.  Medications and allergies reviewed with patient and updated if appropriate.  Patient Active Problem List   Diagnosis Date Noted  . RUQ pain 04/16/2019  . Slipped rib syndrome 06/19/2018  . Nonallopathic lesion of sacral region 06/19/2018  . Nonallopathic lesion of rib cage 06/19/2018  . Palpitations 02/11/2018  . Hypermobility syndrome 03/25/2017  . Nonallopathic lesion of thoracic region 03/25/2017  . Nonallopathic lesion of cervical region 03/25/2017  . Nonallopathic lesion of lumbosacral region 03/25/2017  . Back pain 01/30/2017  . Dandruff 01/23/2016  . Iron deficiency anemia 01/23/2016    Current Outpatient Medications on File Prior to Visit  Medication Sig Dispense Refill  . albuterol (PROVENTIL HFA;VENTOLIN HFA) 108 (90 Base) MCG/ACT inhaler Inhale 2 puffs into the lungs every 4 (four) hours as needed for wheezing or shortness of breath (cough, shortness of breath or wheezing.). 1 Inhaler 1  . etonogestrel-ethinyl estradiol (NUVARING) 0.12-0.015 MG/24HR vaginal ring Place 1 each vaginally every 28 (twenty-eight) days. Insert vaginally and leave in place for 3 consecutive weeks, then remove for 1 week.    . ferrous sulfate 325 (65 FE) MG EC tablet Take 325 mg  by mouth daily.     . fluticasone (FLONASE) 50 MCG/ACT nasal spray Place 2 sprays into both nostrils daily. 16 g 0  . ketoconazole (NIZORAL) 2 % shampoo Apply 1 application topically 2 (two) times a week. 120 mL 1   No current facility-administered medications on file prior to visit.     Past Medical History:  Diagnosis Date  . Anemia   . Hyperlipidemia     Past Surgical History:  Procedure Laterality Date  . WISDOM TOOTH EXTRACTION      Social History   Socioeconomic History  . Marital status: Single    Spouse name: Not on file  . Number of children: Not on file  . Years of education: Not on file  . Highest education level: Not on file  Occupational History  . Not on file  Social Needs  . Financial resource strain: Not on file  . Food insecurity    Worry: Not on file    Inability: Not on file  . Transportation needs    Medical: Not on file    Non-medical: Not on file  Tobacco Use  . Smoking status: Never Smoker  . Smokeless tobacco: Never Used  Substance and Sexual Activity  . Alcohol use: Yes  . Drug use: No  . Sexual activity: Not on file  Lifestyle  . Physical activity    Days per week: Not on file    Minutes per session: Not  on file  . Stress: Not on file  Relationships  . Social Musicianconnections    Talks on phone: Not on file    Gets together: Not on file    Attends religious service: Not on file    Active member of club or organization: Not on file    Attends meetings of clubs or organizations: Not on file    Relationship status: Not on file  Other Topics Concern  . Not on file  Social History Narrative  . Not on file    Family History  Problem Relation Age of Onset  . Diabetes Mother   . Autoimmune disease Mother   . Hyperlipidemia Father   . Cancer Maternal Grandmother        breast  . Breast cancer Maternal Grandmother   . Cancer Paternal Grandmother        breast  . Heart disease Paternal Grandmother   . Breast cancer Paternal Grandmother      Review of Systems  Constitutional: Negative for appetite change.  Respiratory: Negative for shortness of breath.   Cardiovascular: Positive for chest pain (tighteness with anxiety/ panic). Negative for palpitations.  Psychiatric/Behavioral: Positive for dysphoric mood and sleep disturbance. Negative for decreased concentration. The patient is nervous/anxious.        Objective:   Vitals:   07/06/19 0853  BP: 134/82  Pulse: (!) 103  Resp: 16  Temp: 98.8 F (37.1 C)  SpO2: 98%   BP Readings from Last 3 Encounters:  07/06/19 134/82  05/18/19 124/80  04/16/19 120/82   Wt Readings from Last 3 Encounters:  07/06/19 121 lb 12.8 oz (55.2 kg)  05/18/19 123 lb (55.8 kg)  04/16/19 126 lb 12.8 oz (57.5 kg)   Body mass index is 20.91 kg/m.   Physical Exam    Constitutional: Appears well-developed and well-nourished. No distress.  HENT:  Head: Normocephalic and atraumatic.  Neck: Neck supple. No tracheal deviation present. No thyromegaly present.  No cervical lymphadenopathy Cardiovascular: Normal rate, regular rhythm and normal heart sounds.   No murmur heard. No carotid bruit .  No edema Pulmonary/Chest: Effort normal and breath sounds normal. No respiratory distress. No has no wheezes. No rales.  Skin: Skin is warm and dry. Not diaphoretic.  Psychiatric: Mildly anxious mood and affect. Behavior is normal.       Assessment & Plan:    See Problem List for Assessment and Plan of chronic medical problems.

## 2019-07-06 ENCOUNTER — Other Ambulatory Visit: Payer: Self-pay

## 2019-07-06 ENCOUNTER — Ambulatory Visit (INDEPENDENT_AMBULATORY_CARE_PROVIDER_SITE_OTHER): Payer: No Typology Code available for payment source | Admitting: Internal Medicine

## 2019-07-06 ENCOUNTER — Encounter: Payer: Self-pay | Admitting: Internal Medicine

## 2019-07-06 DIAGNOSIS — F329 Major depressive disorder, single episode, unspecified: Secondary | ICD-10-CM | POA: Diagnosis not present

## 2019-07-06 DIAGNOSIS — F419 Anxiety disorder, unspecified: Secondary | ICD-10-CM

## 2019-07-06 DIAGNOSIS — F32A Depression, unspecified: Secondary | ICD-10-CM | POA: Insufficient documentation

## 2019-07-06 DIAGNOSIS — G479 Sleep disorder, unspecified: Secondary | ICD-10-CM | POA: Insufficient documentation

## 2019-07-06 MED ORDER — TEMAZEPAM 7.5 MG PO CAPS: 7.5000 mg | ORAL_CAPSULE | Freq: Every evening | ORAL | 0 refills | Status: DC | PRN

## 2019-07-06 MED ORDER — PAROXETINE HCL 10 MG PO TABS: 10.0000 mg | ORAL_TABLET | Freq: Every day | ORAL | 5 refills | Status: DC

## 2019-07-06 MED FILL — TEMAZEPAM 7.5 MG CAPSULE: 7.5 | 30 days supply | Qty: 30 | Fill #0

## 2019-07-06 MED FILL — PARoxetine HCL 10 MG TABS: 10 | 30 days supply | Qty: 30 | Fill #0

## 2019-07-06 NOTE — Patient Instructions (Addendum)
   Medications reviewed and updated.  Changes include :   Start paxil 10 mg daily.  Take restoril at ngiht for sleep  Your prescription(s) have been submitted to your pharmacy. Please take as directed and contact our office if you believe you are having problem(s) with the medication(s).

## 2019-07-06 NOTE — Assessment & Plan Note (Signed)
As a result of anxiety She has no difficulty falling asleep, but is unable to stay asleep Trazodone has not been that effective and she feels hung over She can take Benadryl, but it dries her out a lot Discussed that if we treat her anxiety her sleep should improve and we will start her on paroxetine, but I expect this to take at least a couple weeks to be effective, possibly sooner She feels she needs something now to use for a brief period of time just so she can get some sleep We will also start Restoril 7.5 mg at night-we both agree this will be short-term

## 2019-07-06 NOTE — Assessment & Plan Note (Signed)
Experiencing significant anxiety and some mild depression Most of her anxiety and stress is related to issues with her new home This is causing difficulty sleeping and she has had a panic attack with some chest tightness She has not been able to control this naturally we both agree she would benefit from medication She would like to see a therapist and will look into that Will start paroxetine 10 mg daily and will titrate if needed She will update me via my chart

## 2019-07-19 NOTE — Progress Notes (Signed)
[name] [address] [place], [place] [address] Phone: [phone] Subjective:     CC: Back and neck pain  [id number]    I, Stacy [name], LAT, ATC, am serving as scribe for Dr. Antoine Primas.  05/18/19: Stable.  Once again discussed ergonomics.  Discussed scapular exercises that I think will be beneficial.  Encourage patient to continue the vitamin D supplementation.  Follow-up again in 4 to 8 weeks  Update: 07/20/19 Stacy [name] is a 28 y.o. female coming in with complaint of neck and upper back pain.  Pt notes increased R scapular and upper back pain.  She states that she did a lot of yard work yesterday.  Pt has finished the weekly prescription Vit D and is picking up the daily OTC Vit D.  Pt states that she hasn't been doing her HEP very regularly.      Past Medical History:  Diagnosis Date  . Anemia   . Hyperlipidemia    Past Surgical History:  Procedure Laterality Date  . WISDOM TOOTH EXTRACTION     Social History   Socioeconomic History  . Marital status: Single    Spouse name: Not on file  . Number of children: Not on file  . Years of education: Not on file  . Highest education level: Not on file  Occupational History  . Not on file  Social Needs  . Financial resource strain: Not on file  . Food insecurity    Worry: Not on file    Inability: Not on file  . Transportation needs    Medical: Not on file    Non-medical: Not on file  Tobacco Use  . Smoking status: Never Smoker  . Smokeless tobacco: Never Used  Substance and Sexual Activity  . Alcohol use: Yes  . Drug use: No  . Sexual activity: Not on file  Lifestyle  . Physical activity    Days per week: Not on file    Minutes per session: Not on file  . Stress: Not on file  Relationships  . Social Musician on phone: Not on file    Gets together: Not on file    Attends religious service: Not on file    Active member of club or organization: Not on  file    Attends meetings of clubs or organizations: Not on file    Relationship status: Not on file  Other Topics Concern  . Not on file  Social History Narrative  . Not on file   Allergies  Allergen Reactions  . Doxycycline Other (See Comments)    Decreased WBC, Neutropenia   Family History  Problem Relation Age of Onset  . Diabetes Mother   . Autoimmune disease Mother   . Hyperlipidemia Father   . Cancer Maternal Grandmother        breast  . Breast cancer Maternal Grandmother   . Cancer Paternal Grandmother        breast  . Heart disease Paternal Grandmother   . Breast cancer Paternal Grandmother     Current Outpatient Medications (Endocrine & Metabolic):  .  etonogestrel-ethinyl estradiol (NUVARING) 0.12-0.015 MG/24HR vaginal ring, Place 1 each vaginally every 28 (twenty-eight) days. Insert vaginally and leave in place for 3 consecutive weeks, then remove for 1 week. .  predniSONE (DELTASONE) 50 MG tablet, Take 1 tablet (50 mg total) by mouth daily with breakfast for 5 days.   Current Outpatient Medications (Respiratory):  .  albuterol (  PROVENTIL HFA;VENTOLIN HFA) 108 (90 Base) MCG/ACT inhaler, Inhale 2 puffs into the lungs every 4 (four) hours as needed for wheezing or shortness of breath (cough, shortness of breath or wheezing.). [name]  fluticasone (FLONASE) 50 MCG/ACT nasal spray, Place 2 sprays into both nostrils daily.   Current Outpatient Medications (Hematological):  .  ferrous sulfate 325 (65 FE) MG EC tablet, Take 325 mg by mouth daily.   Current Outpatient Medications (Other):  .  ketoconazole (NIZORAL) 2 % shampoo, Apply 1 application topically 2 (two) times a week. [name]  PARoxetine (PAXIL) 10 MG tablet, Take 1 tablet (10 mg total) by mouth daily. .  temazepam (RESTORIL) 7.5 MG capsule, Take 1 capsule (7.5 mg total) by mouth at bedtime as needed for sleep.    Past medical history, social, surgical and family history all reviewed in electronic medical record.  No  pertanent information unless stated regarding to the chief complaint.   Review of Systems:  No headache, visual changes, nausea, vomiting, diarrhea, constipation, dizziness, abdominal pain, skin rash, fevers, chills, night sweats, weight loss, swollen lymph nodes, body aches, joint swelling, , chest pain, shortness of breath, mood changes.  Positive muscle aches  Objective  Blood pressure 100/70, pulse (!) 101, height 5\' 4"  (1.626 m), weight 120 lb (54.4 kg), last menstrual period 07/06/2019, SpO2 96 %.    General: No apparent distress alert and oriented x3 mood and affect normal, dressed appropriately.  HEENT: Pupils equal, extraocular movements intact  Respiratory: Patient's speak in full sentences and does not appear short of breath  Cardiovascular: No lower extremity edema, non tender, no erythema  Skin: Warm dry intact with no signs of infection or rash on extremities or on axial skeleton.  Abdomen: Soft nontender  Neuro: Cranial nerves II through XII are intact, neurovascularly intact in all extremities with 2+ DTRs and 2+ pulses.  Lymph: No lymphadenopathy of posterior or anterior cervical chain or axillae bilaterally.  Gait normal with good balance and coordination.  MSK:  Non tender with full range of motion and good stability and symmetric strength and tone of shoulders, elbows, wrist, hip, knee and ankles bilaterally.  Hypermobility noted  Neck: Inspection mild loss of lordosis noted.[name] No palpable stepoffs. Negative Spurling's maneuver. Full neck range of motion Grip strength and sensation normal in bilateral hands Strength good C4 to T1 distribution No sensory change to C4 to T1 Negative Hoffman sign bilaterally Reflexes normal \\Tightness  noted in the right side of the parascapular  Back Exam:  Inspection: Unremarkable  Motion: Flexion 45 deg, Extension 25 deg, Side Bending to 35 deg bilaterally,  Rotation to 45 deg bilaterally  SLR laying: Negative  XSLR laying:  Negative  Palpable tenderness: Tender to palpation the paraspinal musculature lumbar spine right and left more in the parascapular region as well.[name] FABER: negative. Sensory change: Gross sensation intact to all lumbar and sacral dermatomes.  Reflexes: 2+ at both patellar tendons, 2+ at achilles tendons, Babinski's downgoing.  Strength at foot  Plantar-flexion: 5/5 Dorsi-flexion: 5/5 Eversion: 5/5 Inversion: 5/5  Leg strength  Quad: 5/5 Hamstring: 5/5 Hip flexor: 5/5 Hip abductors: 5/5  Gait unremarkable.  Osteopathic findings  C2 flexed rotated and side bent right C4 flexed rotated and side bent left C7 flexed rotated and side bent right T3 extended rotated and side bent right inhaled third rib T9 extended rotated and side bent left L2 flexed rotated and side bent right Sacrum right on right     Impression and Recommendations:  This case required medical decision making of moderate complexity. The above documentation has been reviewed and is accurate and complete [name], DO       Note: This dictation was prepared with Dragon dictation along with smaller phrase technology. Any transcriptional errors that result from this process are unintentional.

## 2019-07-20 ENCOUNTER — Encounter: Payer: Self-pay | Admitting: Family Medicine

## 2019-07-20 ENCOUNTER — Other Ambulatory Visit: Payer: Self-pay

## 2019-07-20 ENCOUNTER — Ambulatory Visit: Payer: No Typology Code available for payment source | Admitting: Family Medicine

## 2019-07-20 VITALS — BP 100/70 | HR 101 | Ht 64.0 in | Wt 120.0 lb

## 2019-07-20 DIAGNOSIS — M940 Chondrocostal junction syndrome [Tietze]: Secondary | ICD-10-CM

## 2019-07-20 DIAGNOSIS — M999 Biomechanical lesion, unspecified: Secondary | ICD-10-CM | POA: Diagnosis not present

## 2019-07-20 MED ORDER — PREDNISONE 50 MG PO TABS: 50.0000 mg | ORAL_TABLET | Freq: Every day | ORAL | 0 refills | Status: AC

## 2019-07-20 NOTE — Assessment & Plan Note (Signed)
Patient is a more of a segment syndrome.  Patient is having worsening exacerbation, underlying anxiety and depression as well as repetitive motions likely contribute.  We discussed home exercises, icing regimen, which activities to do which was to avoid.  Follow-up again in 4 weeks

## 2019-07-20 NOTE — Patient Instructions (Signed)
Prednisone 50 mg 1 tab daily #5 - [facility]  Begin taking tomorrow.  See me again in 4 weeks

## 2019-07-20 NOTE — Assessment & Plan Note (Addendum)
Decision today to treat with OMT was based on Physical Exam  After verbal consent patient was treated with HVLA, ME, FPR techniques in cervical, thoracic, rib lumbar and sacral areas  Patient tolerated the procedure well with improvement in symptoms  Patient given exercises, stretches and lifestyle modifications  See medications in patient instructions if given  Patient will follow up in 4-8 weeks 

## 2019-08-27 ENCOUNTER — Encounter: Payer: Self-pay | Admitting: Family Medicine

## 2019-08-27 ENCOUNTER — Other Ambulatory Visit: Payer: Self-pay

## 2019-08-27 ENCOUNTER — Ambulatory Visit: Payer: No Typology Code available for payment source | Admitting: Family Medicine

## 2019-08-27 VITALS — BP 122/82 | HR 97 | Ht 64.0 in | Wt 120.0 lb

## 2019-08-27 DIAGNOSIS — M940 Chondrocostal junction syndrome [Tietze]: Secondary | ICD-10-CM

## 2019-08-27 DIAGNOSIS — M546 Pain in thoracic spine: Secondary | ICD-10-CM | POA: Diagnosis not present

## 2019-08-27 DIAGNOSIS — G8929 Other chronic pain: Secondary | ICD-10-CM

## 2019-08-27 DIAGNOSIS — M999 Biomechanical lesion, unspecified: Secondary | ICD-10-CM | POA: Diagnosis not present

## 2019-08-27 NOTE — Assessment & Plan Note (Signed)
Decision today to treat with OMT was based on Physical Exam  After verbal consent patient was treated with HVLA, ME, FPR techniques in cervical, thoracic, rib lumbar and sacral areas  Patient tolerated the procedure well with improvement in symptoms  Patient given exercises, stretches and lifestyle modifications  See medications in patient instructions if given  Patient will follow up in 4-8 weeks 

## 2019-08-27 NOTE — Assessment & Plan Note (Signed)
Sleep leg syndrome.  Discussed which activities to do which was to avoid.  Discussed icing regimen and home exercise.  Increase activity as tolerated.  Follow-up again in 4 to 8 weeks

## 2019-08-27 NOTE — Progress Notes (Signed)
[name] [name], [place] [address] Phone: [phone] Subjective:   Stacy [name], am serving as a scribe for Dr. Hulan Saas.     CC: Low back and neck pain  [id number]   07/20/2019 Patient is a more of a segment syndrome.  Patient is having worsening exacerbation, underlying anxiety and depression as well as repetitive motions likely contribute.  We discussed home exercises, icing regimen, which activities to do which was to avoid.  Follow-up again in 4 weeks  Update 10/26/2018 Stacy [name] is a 28 y.o. female coming in with complaint of back pain. Patient is here for OMT to manage her back pain. Patient states that she is having neck pain that radiates into her shoulder.   Patient notices pain in right hip deep in the joint with yardwork. [name] pain with walking on flat surfaces. Pain is occasionally sharp which may last 2 days and may sometimes become dull. Denies any radiating.   Past Medical History:  Diagnosis Date  . Anemia   . Hyperlipidemia    Past Surgical History:  Procedure Laterality Date  . WISDOM TOOTH EXTRACTION     Social History   Socioeconomic History  . Marital status: Single    Spouse name: Not on file  . Number of children: Not on file  . Years of education: Not on file  . Highest education level: Not on file  Occupational History  . Not on file  Social Needs  . Financial resource strain: Not on file  . Food insecurity    Worry: Not on file    Inability: Not on file  . Transportation needs    Medical: Not on file    Non-medical: Not on file  Tobacco Use  . Smoking status: Never Smoker  . Smokeless tobacco: Never Used  Substance and Sexual Activity  . Alcohol use: Yes  . Drug use: [name]  . Sexual activity: Not on file  Lifestyle  . Physical activity    Days per week: Not on file    Minutes per session: Not on file  . Stress: Not on file  Relationships  . Social Herbalist on  phone: Not on file    Gets together: Not on file    Attends religious service: Not on file    Active member of club or organization: Not on file    Attends meetings of clubs or organizations: Not on file    Relationship status: Not on file  Other Topics Concern  . Not on file  Social History Narrative  . Not on file   Allergies  Allergen Reactions  . Doxycycline Other (See Comments)    Decreased WBC, Neutropenia   Family History  Problem Relation Age of Onset  . Diabetes Mother   . Autoimmune disease Mother   . Hyperlipidemia Father   . Cancer Maternal Grandmother        breast  . Breast cancer Maternal Grandmother   . Cancer Paternal Grandmother        breast  . Heart disease Paternal Grandmother   . Breast cancer Paternal Grandmother     Current Outpatient Medications (Endocrine & Metabolic):  .  etonogestrel-ethinyl estradiol (NUVARING) 0.12-0.015 MG/24HR vaginal ring, Place 1 each vaginally every 28 (twenty-eight) days. Insert vaginally and leave in place for 3 consecutive weeks, then remove for 1 week.   Current Outpatient Medications (Respiratory):  .  albuterol (PROVENTIL HFA;VENTOLIN HFA) 108 (90  Base) MCG/ACT inhaler, Inhale 2 puffs into the lungs every 4 (four) hours as needed for wheezing or shortness of breath (cough, shortness of breath or wheezing.). [name]  fluticasone (FLONASE) 50 MCG/ACT nasal spray, Place 2 sprays into both nostrils daily.   Current Outpatient Medications (Hematological):  .  ferrous sulfate 325 (65 FE) MG EC tablet, Take 325 mg by mouth daily.   Current Outpatient Medications (Other):  .  ketoconazole (NIZORAL) 2 % shampoo, Apply 1 application topically 2 (two) times a week. [name]  PARoxetine (PAXIL) 10 MG tablet, Take 1 tablet (10 mg total) by mouth daily. .  temazepam (RESTORIL) 7.5 MG capsule, Take 1 capsule (7.5 mg total) by mouth at bedtime as needed for sleep.    Past medical history, social, surgical and family history all reviewed in  electronic medical record.  [name] pertanent information unless stated regarding to the chief complaint.   Review of Systems:  [name] headache, visual changes, nausea, vomiting, diarrhea, constipation, dizziness, abdominal pain, skin rash, fevers, chills, night sweats, weight loss, swollen lymph nodes, body aches, joint swelling, muscle aches, chest pain, shortness of breath, mood changes.  Positive muscle aches  Objective  Blood pressure 122/82, pulse 97, height 5\' 4"  (1.626 m), weight 120 lb (54.4 kg), SpO2 98 %.    General: [name] apparent distress alert and oriented x3 mood and affect normal, dressed appropriately.  HEENT: Pupils equal, extraocular movements intact  Respiratory: Patient's speak in full sentences and does not appear short of breath  Cardiovascular: [name] lower extremity edema, non tender, [name] erythema  Skin: Warm dry intact with [name] signs of infection or rash on extremities or on axial skeleton.  Abdomen: Soft nontender  Neuro: Cranial nerves II through XII are intact, neurovascularly intact in all extremities with 2+ DTRs and 2+ pulses.  Lymph: [name] lymphadenopathy of posterior or anterior cervical chain or axillae bilaterally.  Gait normal with good balance and coordination.  MSK:  Non tender with full range of motion and good stability and symmetric strength and tone of shoulders, elbows, wrist, hip, knee and ankles bilaterally.  Hypermobility noted Neck: Inspection mild loss of lordosis. [name] palpable stepoffs. Negative Spurling's maneuver. Full neck range of motion Grip strength and sensation normal in bilateral hands Strength good C4 to T1 distribution [name] sensory change to C4 to T1 Negative Hoffman sign bilaterally Reflexes normal  Back Exam:  Inspection: Mild loss of lordosis Motion: Flexion 40 deg, Extension 25 deg, Side Bending to 35 deg bilaterally,  Rotation to 45 deg bilaterally  SLR laying: Negative  XSLR laying: Negative  Palpable tenderness: Tender to palpation of  paraspinal musculature more in the thoracolumbar lumbosacral area. FABER: negative. Sensory change: Gross sensation intact to all lumbar and sacral dermatomes.  Reflexes: 2+ at both patellar tendons, 2+ at achilles tendons, Babinski's downgoing.  Strength at foot  Plantar-flexion: 5/5 Dorsi-flexion: 5/5 Eversion: 5/5 Inversion: 5/5  Leg strength  Quad: 5/5 Hamstring: 5/5 Hip flexor: 5/5 Hip abductors: 5/5    Osteopathic findings C2 flexed rotated and side bent right C7 flexed rotated and side bent left T3 extended rotated and side bent right inhaled third rib T9 extended rotated and side bent left L2 flexed rotated and side bent right Sacrum right on right    Impression and Recommendations:     This case required medical decision making of moderate complexity. The above documentation has been reviewed and is accurate and complete [name], DO       Note: This dictation  was prepared with Dragon dictation along with smaller phrase technology. Any transcriptional errors that result from this process are unintentional.

## 2019-08-27 NOTE — Assessment & Plan Note (Signed)
Multifactorial.  Discussed posture and ergonomics, discussed hypermobility.  Discussed topical anti-inflammatories and oral anti-inflammatories.  Follow-up again in 6-8 weeks

## 2019-08-27 NOTE — Patient Instructions (Signed)
See me back in 6 weeks 

## 2019-10-11 NOTE — Progress Notes (Signed)
[name] [address] [place], [place] [address] Phone: [phone] Subjective:   Stacy [name], am serving as a scribe for Dr. Hulan Saas. This visit occurred during the SARS-CoV-2 public health emergency.  Safety protocols were in place, including screening questions prior to the visit, additional usage of staff PPE, and extensive cleaning of exam room while observing appropriate contact time as indicated for disinfecting solutions.   CC: Neck pain follow-up, back pain follow-up  [id number]   08/27/2019 Sleep leg syndrome.  Discussed which activities to do which was to avoid.  Discussed icing regimen and home exercise.  Increase activity as tolerated.  Follow-up again in 4 to 8 weeks  Update 10/12/2019 Stacy [name] is a 28 y.o. female coming in with complaint of back pain. Patient is here for OMT. Patient states that she has been feeling dizzy with movement over the past 3 days. Has had this occur before but this is the longest that she has had symptoms. Patient notes left shoulder and lower back are bothering her as well.     Past Medical History:  Diagnosis Date  . Anemia   . Hyperlipidemia    Past Surgical History:  Procedure Laterality Date  . WISDOM TOOTH EXTRACTION     Social History   Socioeconomic History  . Marital status: Single    Spouse name: Not on file  . Number of children: Not on file  . Years of education: Not on file  . Highest education level: Not on file  Occupational History  . Not on file  Tobacco Use  . Smoking status: Never Smoker  . Smokeless tobacco: Never Used  Substance and Sexual Activity  . Alcohol use: Yes  . Drug use: [name]  . Sexual activity: Not on file  Other Topics Concern  . Not on file  Social History Narrative  . Not on file   Social Determinants of Health   Financial Resource Strain:   . Difficulty of Paying Living Expenses: Not on file  Food Insecurity:   . Worried About Paediatric nurse in the Last Year: Not on file  . Ran Out of Food in the Last Year: Not on file  Transportation Needs:   . Lack of Transportation (Medical): Not on file  . Lack of Transportation (Non-Medical): Not on file  Physical Activity:   . Days of Exercise per Week: Not on file  . Minutes of Exercise per Session: Not on file  Stress:   . Feeling of Stress : Not on file  Social Connections:   . Frequency of Communication with Friends and Family: Not on file  . Frequency of Social Gatherings with Friends and Family: Not on file  . Attends Religious Services: Not on file  . Active Member of Clubs or Organizations: Not on file  . Attends Archivist Meetings: Not on file  . Marital Status: Not on file   Allergies  Allergen Reactions  . Doxycycline Other (See Comments)    Decreased WBC, Neutropenia   Family History  Problem Relation Age of Onset  . Diabetes Mother   . Autoimmune disease Mother   . Hyperlipidemia Father   . Cancer Maternal Grandmother        breast  . Breast cancer Maternal Grandmother   . Cancer Paternal Grandmother        breast  . Heart disease Paternal Grandmother   . Breast cancer Paternal Grandmother     Current  Outpatient Medications (Endocrine & Metabolic):  .  etonogestrel-ethinyl estradiol (NUVARING) 0.12-0.015 MG/24HR vaginal ring, Place 1 each vaginally every 28 (twenty-eight) days. Insert vaginally and leave in place for 3 consecutive weeks, then remove for 1 week.   Current Outpatient Medications (Respiratory):  .  albuterol (PROVENTIL HFA;VENTOLIN HFA) 108 (90 Base) MCG/ACT inhaler, Inhale 2 puffs into the lungs every 4 (four) hours as needed for wheezing or shortness of breath (cough, shortness of breath or wheezing.). [name]  fluticasone (FLONASE) 50 MCG/ACT nasal spray, Place 2 sprays into both nostrils daily.   Current Outpatient Medications (Hematological):  .  ferrous sulfate 325 (65 FE) MG EC tablet, Take 325 mg by mouth daily.    Current Outpatient Medications (Other):  .  ketoconazole (NIZORAL) 2 % shampoo, Apply 1 application topically 2 (two) times a week. [name]  PARoxetine (PAXIL) 10 MG tablet, Take 1 tablet (10 mg total) by mouth daily. .  temazepam (RESTORIL) 7.5 MG capsule, Take 1 capsule (7.5 mg total) by mouth at bedtime as needed for sleep.    Past medical history, social, surgical and family history all reviewed in electronic medical record.  [name] pertanent information unless stated regarding to the chief complaint.   Review of Systems:  [name] headache, visual changes, nausea, vomiting, diarrhea, constipation, dizziness, abdominal pain, skin rash, fevers, chills, night sweats, weight loss, swollen lymph nodes, body aches, joint swelling,  chest pain, shortness of breath, mood changes.  Positive muscle aches  Objective  Blood pressure 122/78, pulse (!) 109, height 5\' 4"  (1.626 m), weight 124 lb (56.2 kg), SpO2 99 %.    General: [name] apparent distress alert and oriented x3 mood and affect normal, dressed appropriately.  HEENT: Pupils equal, extraocular movements intact  Respiratory: Patient's speak in full sentences and does not appear short of breath  Cardiovascular: [name] lower extremity edema, non tender, [name] erythema  Skin: Warm dry intact with [name] signs of infection or rash on extremities or on axial skeleton.  Abdomen: Soft nontender  Neuro: Cranial nerves II through XII are intact, neurovascularly intact in all extremities with 2+ DTRs and 2+ pulses.  Lymph: [name] lymphadenopathy of posterior or anterior cervical chain or axillae bilaterally.  Gait normal with good balance and coordination.  MSK:  Non tender with full range of motion and good stability and symmetric strength and tone of shoulders, elbows, wrist, hip, knee and ankles bilaterally.  Hypermobility noted  Back Exam:  Inspection: Unremarkable  Motion: Flexion 45 deg, Extension 25 deg, Side Bending to 45 deg bilaterally,  Rotation to 45 deg  bilaterally  SLR laying: Negative  XSLR laying: Negative  Palpable tenderness: Tender to palpation mostly in the thoracolumbar junction right greater than left in the sacroiliac junction right greater than left.  Mild parascapular tightness on the upper portion of the thoracic spine left this time the right. FABER: negative. Sensory change: Gross sensation intact to all lumbar and sacral dermatomes.  Reflexes: 2+ at both patellar tendons, 2+ at achilles tendons, Babinski's downgoing.  Strength at foot  Plantar-flexion: 5/5 Dorsi-flexion: 5/5 Eversion: 5/5 Inversion: 5/5  Leg strength  Quad: 5/5 Hamstring: 5/5 Hip flexor: 5/5 Hip abductors: 5/5  Gait unremarkable.  Osteopathic findings  C2 flexed rotated and side bent right C6 flexed rotated and side bent left T3 extended rotated and side bent left inhaled third rib T 11 extended rotated and side bent right L4 flexed rotated and side bent left  Sacrum right on right     Impression and Recommendations:  This case required medical decision making of moderate complexity. The above documentation has been reviewed and is accurate and complete [name], DO       Note: This dictation was prepared with Dragon dictation along with smaller phrase technology. Any transcriptional errors that result from this process are unintentional.

## 2019-10-11 NOTE — Assessment & Plan Note (Signed)
Multifactorial.  Patient is trying to workout on a more regular basis.  Discussed icing regimen and home exercises, discussed which activities to do which wants to avoid.  Patient will increase activity slowly.  Follow-up again within the next 8 weeks

## 2019-10-12 ENCOUNTER — Ambulatory Visit: Payer: No Typology Code available for payment source | Admitting: Family Medicine

## 2019-10-12 ENCOUNTER — Other Ambulatory Visit: Payer: Self-pay

## 2019-10-12 ENCOUNTER — Encounter: Payer: Self-pay | Admitting: Family Medicine

## 2019-10-12 VITALS — BP 122/78 | HR 109 | Ht 64.0 in | Wt 124.0 lb

## 2019-10-12 DIAGNOSIS — M940 Chondrocostal junction syndrome [Tietze]: Secondary | ICD-10-CM | POA: Diagnosis not present

## 2019-10-12 DIAGNOSIS — M546 Pain in thoracic spine: Secondary | ICD-10-CM

## 2019-10-12 DIAGNOSIS — M999 Biomechanical lesion, unspecified: Secondary | ICD-10-CM | POA: Diagnosis not present

## 2019-10-12 DIAGNOSIS — G8929 Other chronic pain: Secondary | ICD-10-CM | POA: Diagnosis not present

## 2019-10-12 NOTE — Patient Instructions (Signed)
Good to see you Pillow cube or Coup pillow See me again in 4 weeks

## 2019-10-12 NOTE — Assessment & Plan Note (Signed)
Continue to have some mild muscle imbalances and does have some hypermobility Significant contributing.  Patient has not been very compliant with her vitamin supplementations.  Patient is to increase activity as tolerated.  Patient responded fairly well to osteopathic manipulation and encouraged her to go back on the vitamin supplementations that seem to be helping.  Follow-up again in 4 to 8 weeks.

## 2019-10-12 NOTE — Assessment & Plan Note (Signed)
Decision today to treat with OMT was based on Physical Exam  After verbal consent patient was treated with HVLA, ME, FPR techniques in cervical, thoracic, rib,  lumbar and sacral areas  Patient tolerated the procedure well with improvement in symptoms  Patient given exercises, stretches and lifestyle modifications  See medications in patient instructions if given  Patient will follow up in 4-8 weeks 

## 2019-10-31 MED FILL — ETONOGESTREL-ETHINYL ESTRAD: 0.12-0.015 | 84 days supply | Qty: 3 | Fill #0

## 2019-10-31 MED FILL — PARoxetine HCL 10 MG TABS: 10 | 30 days supply | Qty: 30 | Fill #0

## 2019-11-11 ENCOUNTER — Other Ambulatory Visit: Payer: Self-pay

## 2019-11-11 ENCOUNTER — Ambulatory Visit (INDEPENDENT_AMBULATORY_CARE_PROVIDER_SITE_OTHER): Payer: No Typology Code available for payment source | Admitting: Family Medicine

## 2019-11-11 ENCOUNTER — Encounter: Payer: Self-pay | Admitting: Family Medicine

## 2019-11-11 VITALS — BP 106/72 | HR 86 | Ht 64.0 in | Wt 124.0 lb

## 2019-11-11 DIAGNOSIS — M999 Biomechanical lesion, unspecified: Secondary | ICD-10-CM

## 2019-11-11 DIAGNOSIS — M940 Chondrocostal junction syndrome [Tietze]: Secondary | ICD-10-CM | POA: Diagnosis not present

## 2019-11-11 NOTE — Progress Notes (Signed)
[place] Phone: [phone] Subjective:   Stacy [name], am serving as a scribe for Dr. Hulan Saas. This visit occurred during the SARS-CoV-2 public health emergency.  Safety protocols were in place, including screening questions prior to the visit, additional usage of staff PPE, and extensive cleaning of exam room while observing appropriate contact time as indicated for disinfecting solutions.   I'm seeing this patient by the request  of:  Binnie Rail, MD  CC: Neck and shoulder pain follow-up  [id number]  Stacy [name] is a 29 y.o. female coming in with complaint of back pain. Last seen on 10/12/2019 for OMT. Patient states that she has been having tightness in right shoulder and left side of cervical spine.  Patient states that it is mild aching.  Nothing severe though.  Doing better than her previous exam overall but still has exacerbations from time to time      Past Medical History:  Diagnosis Date  . Anemia   . Hyperlipidemia    Past Surgical History:  Procedure Laterality Date  . WISDOM TOOTH EXTRACTION     Social History   Socioeconomic History  . Marital status: Single    Spouse name: Not on file  . Number of children: Not on file  . Years of education: Not on file  . Highest education level: Not on file  Occupational History  . Not on file  Tobacco Use  . Smoking status: Never Smoker  . Smokeless tobacco: Never Used  Substance and Sexual Activity  . Alcohol use: Yes  . Drug use: [name]  . Sexual activity: Not on file  Other Topics Concern  . Not on file  Social History Narrative  . Not on file   Social Determinants of Health   Financial Resource Strain:   . Difficulty of Paying Living Expenses: Not on file  Food Insecurity:   . Worried About Charity fundraiser in the Last Year: Not on file  . Ran Out of Food in the Last Year: Not on file  Transportation Needs:   .  Lack of Transportation (Medical): Not on file  . Lack of Transportation (Non-Medical): Not on file  Physical Activity:   . Days of Exercise per Week: Not on file  . Minutes of Exercise per Session: Not on file  Stress:   . Feeling of Stress : Not on file  Social Connections:   . Frequency of Communication with Friends and Family: Not on file  . Frequency of Social Gatherings with Friends and Family: Not on file  . Attends Religious Services: Not on file  . Active Member of Clubs or Organizations: Not on file  . Attends Archivist Meetings: Not on file  . Marital Status: Not on file   Allergies  Allergen Reactions  . Doxycycline Other (See Comments)    Decreased WBC, Neutropenia   Family History  Problem Relation Age of Onset  . Diabetes Mother   . Autoimmune disease Mother   . Hyperlipidemia Father   . Cancer Maternal Grandmother        breast  . Breast cancer Maternal Grandmother   . Cancer Paternal Grandmother        breast  . Heart disease Paternal Grandmother   . Breast cancer Paternal Grandmother     Current Outpatient Medications (Endocrine & Metabolic):  .  etonogestrel-ethinyl estradiol (NUVARING) 0.12-0.015 MG/24HR vaginal ring, Place 1 each vaginally every  28 (twenty-eight) days. Insert vaginally and leave in place for 3 consecutive weeks, then remove for 1 week.   Current Outpatient Medications (Respiratory):  .  albuterol (PROVENTIL HFA;VENTOLIN HFA) 108 (90 Base) MCG/ACT inhaler, Inhale 2 puffs into the lungs every 4 (four) hours as needed for wheezing or shortness of breath (cough, shortness of breath or wheezing.). [name]  fluticasone (FLONASE) 50 MCG/ACT nasal spray, Place 2 sprays into both nostrils daily.   Current Outpatient Medications (Hematological):  .  ferrous sulfate 325 (65 FE) MG EC tablet, Take 325 mg by mouth daily.   Current Outpatient Medications (Other):  .  ketoconazole (NIZORAL) 2 % shampoo, Apply 1 application topically 2 (two)  times a week. [name]  PARoxetine (PAXIL) 10 MG tablet, Take 1 tablet (10 mg total) by mouth daily. .  temazepam (RESTORIL) 7.5 MG capsule, Take 1 capsule (7.5 mg total) by mouth at bedtime as needed for sleep.   Reviewed prior external information including notes and imaging from  primary care provider As well as notes that were available from care everywhere and other healthcare systems.  Past medical history, social, surgical and family history all reviewed in electronic medical record.  [name] pertanent information unless stated regarding to the chief complaint.   Review of Systems:  [name] headache, visual changes, nausea, vomiting, diarrhea, constipation, dizziness, abdominal pain, skin rash, fevers, chills, night sweats, weight loss, swollen lymph nodes, body aches, joint swelling, chest pain, shortness of breath, mood changes. POSITIVE muscle aches  Objective  There were [name] vitals taken for this visit.   General: [name] apparent distress alert and oriented x3 mood and affect normal, dressed appropriately.  HEENT: Pupils equal, extraocular movements intact  Respiratory: Patient's speak in full sentences and does not appear short of breath  Cardiovascular: [name] lower extremity edema, non tender, [name] erythema  Skin: Warm dry intact with [name] signs of infection or rash on extremities or on axial skeleton.  Abdomen: Soft nontender  Neuro: Cranial nerves II through XII are intact, neurovascularly intact in all extremities with 2+ DTRs and 2+ pulses.  Lymph: [name] lymphadenopathy of posterior or anterior cervical chain or axillae bilaterally.  Gait normal with good balance and coordination.  MSK:  Non tender with full range of motion and good stability and symmetric strength and tone of shoulders, elbows, wrist, hip, knee and ankles bilaterally.  Hypermobility noted Neck: Inspection unremarkable. [name] palpable stepoffs. Negative Spurling's maneuver. Full neck range of motion Grip strength and sensation normal  in bilateral hands Strength good C4 to T1 distribution [name] sensory change to C4 to T1 Negative Hoffman sign bilaterally Reflexes normal Tightness noted in the parascapular region bilaterally  Back Exam:  Inspection: Loss of lordosis Motion: Flexion 45 deg, Extension 25 deg, Side Bending to 35 deg bilaterally,  Rotation to 45 deg bilaterally  SLR laying: Negative  XSLR laying: Negative  Palpable tenderness: Tender to palpation in the paraspinal musculature mostly in the thoracolumbar junction mild over the sacroiliac joint. FABER: negative. Sensory change: Gross sensation intact to all lumbar and sacral dermatomes.  Reflexes: 2+ at both patellar tendons, 2+ at achilles tendons, Babinski's downgoing.  Strength at foot  Plantar-flexion: 5/5 Dorsi-flexion: 5/5 Eversion: 5/5 Inversion: 5/5  Leg strength  Quad: 5/5 Hamstring: 5/5 Hip flexor: 5/5 Hip abductors: 5/5  Gait unremarkable.  Osteopathic findings  C4 flexed rotated and side bent left C7 flexed rotated and side bent left T3 extended rotated and side bent right inhaled third rib T6 extended rotated and side  bent left L2 flexed rotated and side bent right Sacrum right on right    Impression and Recommendations:     This case required medical decision making of moderate complexity. The above documentation has been reviewed and is accurate and complete Stacy Saa, DO       Note: This dictation was prepared with Dragon dictation along with smaller phrase technology. Any transcriptional errors that result from this process are unintentional.

## 2019-11-11 NOTE — Assessment & Plan Note (Signed)
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  Seems to be stable with maybe some mild exacerbation.  Discussed with patient about posture and ergonomics again, proper lifting, discussed different medications.  Patient wants to continue with what she is doing at this moment.  Follow-up with me again in 4 to 8 weeks

## 2019-11-11 NOTE — Assessment & Plan Note (Signed)
Decision today to treat with OMT was based on Physical Exam  After verbal consent patient was treated with HVLA, ME, FPR techniques in cervical, thoracic, rib,  lumbar and sacral areas  Patient tolerated the procedure well with improvement in symptoms  Patient given exercises, stretches and lifestyle modifications  See medications in patient instructions if given  Patient will follow up in 4-8 weeks 

## 2019-11-11 NOTE — Patient Instructions (Signed)
See me again in 6 weeks Lift a weight

## 2019-12-13 ENCOUNTER — Encounter: Payer: Self-pay | Admitting: Internal Medicine

## 2019-12-13 MED ORDER — TEMAZEPAM 7.5 MG PO CAPS: 7.5000 mg | ORAL_CAPSULE | Freq: Every evening | ORAL | 0 refills | Status: DC | PRN

## 2019-12-14 MED FILL — TEMAZEPAM 7.5 MG CAPSULE: 7.5 | 30 days supply | Qty: 30 | Fill #0

## 2019-12-14 MED FILL — PARoxetine HCL 10 MG TABS: 10 | 30 days supply | Qty: 30 | Fill #1

## 2019-12-22 ENCOUNTER — Encounter: Payer: Self-pay | Admitting: Family Medicine

## 2019-12-22 ENCOUNTER — Ambulatory Visit: Payer: No Typology Code available for payment source | Admitting: Family Medicine

## 2019-12-22 ENCOUNTER — Other Ambulatory Visit: Payer: Self-pay

## 2019-12-22 VITALS — BP 120/84 | HR 94 | Ht 64.0 in | Wt 125.0 lb

## 2019-12-22 DIAGNOSIS — M940 Chondrocostal junction syndrome [Tietze]: Secondary | ICD-10-CM | POA: Diagnosis not present

## 2019-12-22 DIAGNOSIS — M999 Biomechanical lesion, unspecified: Secondary | ICD-10-CM | POA: Diagnosis not present

## 2019-12-22 DIAGNOSIS — M357 Hypermobility syndrome: Secondary | ICD-10-CM | POA: Diagnosis not present

## 2019-12-22 NOTE — Assessment & Plan Note (Signed)
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  Chronic problem with mild weight loss which

## 2019-12-22 NOTE — Assessment & Plan Note (Signed)
Chronic problem but stable.  We will continue to monitor.  Social determinants of health including patient working overtime at the moment due to the Covid and not being able to work out on this much of a regular basis.

## 2019-12-22 NOTE — Patient Instructions (Signed)
We will get you a fall risk bracelet See me in 4-5 weeks

## 2019-12-22 NOTE — Progress Notes (Signed)
[name] [address] [place] [address] Phone: [phone] Subjective:   Stacy [name], am serving as a scribe for Dr. Antoine Primas. This visit occurred during the SARS-CoV-2 public health emergency.  Safety protocols were in place, including screening questions prior to the visit, additional usage of staff PPE, and extensive cleaning of exam room while observing appropriate contact time as indicated for disinfecting solutions.   I'm seeing this patient by the request  of:  Pincus Sanes, MD  CC: neck and back pain   [id number]  Stacy [name] is a 29 y.o. female coming in with complaint of neck and back pain. Last seen on 11/11/2019 for OMT. Patient states that she has been having right shoulder pain. Did slip down 3 stairs and can tell that she stretched something in her right shoulder one month ago. Pain is over scapula vs shoulder joint.        Past Medical History:  Diagnosis Date  . Anemia   . Hyperlipidemia    Past Surgical History:  Procedure Laterality Date  . WISDOM TOOTH EXTRACTION     Social History   Socioeconomic History  . Marital status: Single    Spouse name: Not on file  . Number of children: Not on file  . Years of education: Not on file  . Highest education level: Not on file  Occupational History  . Not on file  Tobacco Use  . Smoking status: Never Smoker  . Smokeless tobacco: Never Used  Substance and Sexual Activity  . Alcohol use: Yes  . Drug use: No  . Sexual activity: Not on file  Other Topics Concern  . Not on file  Social History Narrative  . Not on file   Social Determinants of Health   Financial Resource Strain:   . Difficulty of Paying Living Expenses: Not on file  Food Insecurity:   . Worried About Programme researcher, broadcasting/film/video in the Last Year: Not on file  . Ran Out of Food in the Last Year: Not on file  Transportation Needs:   . Lack of Transportation (Medical): Not on file  . Lack of  Transportation (Non-Medical): Not on file  Physical Activity:   . Days of Exercise per Week: Not on file  . Minutes of Exercise per Session: Not on file  Stress:   . Feeling of Stress : Not on file  Social Connections:   . Frequency of Communication with Friends and Family: Not on file  . Frequency of Social Gatherings with Friends and Family: Not on file  . Attends Religious Services: Not on file  . Active Member of Clubs or Organizations: Not on file  . Attends Banker Meetings: Not on file  . Marital Status: Not on file   Allergies  Allergen Reactions  . Doxycycline Other (See Comments)    Decreased WBC, Neutropenia   Family History  Problem Relation Age of Onset  . Diabetes Mother   . Autoimmune disease Mother   . Hyperlipidemia Father   . Cancer Maternal Grandmother        breast  . Breast cancer Maternal Grandmother   . Cancer Paternal Grandmother        breast  . Heart disease Paternal Grandmother   . Breast cancer Paternal Grandmother     Current Outpatient Medications (Endocrine & Metabolic):  .  etonogestrel-ethinyl estradiol (NUVARING) 0.12-0.015 MG/24HR vaginal ring, Place 1 each vaginally every 28 (twenty-eight) days. Insert vaginally  and leave in place for 3 consecutive weeks, then remove for 1 week.   Current Outpatient Medications (Respiratory):  .  albuterol (PROVENTIL HFA;VENTOLIN HFA) 108 (90 Base) MCG/ACT inhaler, Inhale 2 puffs into the lungs every 4 (four) hours as needed for wheezing or shortness of breath (cough, shortness of breath or wheezing.). [name]  fluticasone (FLONASE) 50 MCG/ACT nasal spray, Place 2 sprays into both nostrils daily.   Current Outpatient Medications (Hematological):  .  ferrous sulfate 325 (65 FE) MG EC tablet, Take 325 mg by mouth daily.   Current Outpatient Medications (Other):  .  ketoconazole (NIZORAL) 2 % shampoo, Apply 1 application topically 2 (two) times a week. [name]  PARoxetine (PAXIL) 10 MG tablet, Take 1  tablet (10 mg total) by mouth daily. .  temazepam (RESTORIL) 7.5 MG capsule, Take 1 capsule (7.5 mg total) by mouth at bedtime as needed for sleep.   Reviewed prior external information including notes and imaging from  primary care provider As well as notes that were available from care everywhere and other healthcare systems.  Past medical history, social, surgical and family history all reviewed in electronic medical record.  No pertanent information unless stated regarding to the chief complaint.   Review of Systems:  No headache, visual changes, nausea, vomiting, diarrhea, constipation, dizziness, abdominal pain, skin rash, fevers, chills, night sweats, weight loss, swollen lymph nodes, body aches, joint swelling, chest pain, shortness of breath, mood changes. POSITIVE muscle aches  Objective  There were no vitals taken for this visit.   General: No apparent distress alert and oriented x3 mood and affect normal, dressed appropriately.  HEENT: Pupils equal, extraocular movements intact  Respiratory: Patient's speak in full sentences and does not appear short of breath  Cardiovascular: No lower extremity edema, non tender, no erythema  Skin: Warm dry intact with no signs of infection or rash on extremities or on axial skeleton.  Abdomen: Soft nontender  Neuro: Cranial nerves II through XII are intact, neurovascularly intact in all extremities with 2+ DTRs and 2+ pulses.  Lymph: No lymphadenopathy of posterior or anterior cervical chain or axillae bilaterally.  Gait normal with good balance and coordination.  MSK:  Non tender with full range of motion and good stability and symmetric strength and tone of shoulders, elbows, wrist, hip, knee and ankles bilaterally.  Hypermobility noted Neck: Inspection mild loss of lordosis. No palpable stepoffs. Negative Spurling's maneuver. Full neck range of motion Grip strength and sensation normal in bilateral hands Strength good C4 to T1  distribution No sensory change to C4 to T1 Negative Hoffman sign bilaterally Reflexes normal Tightness noted over the trapezius fairly severe.  Multiple trigger points noted in the trapezius, rhomboid and levator scapula  Back Exam:  Inspection: Unremarkable  Motion: Flexion 45 deg, Extension 35 deg, Side Bending to 45 deg bilaterally,  Rotation to 45 deg bilaterally  SLR laying: Negative  XSLR laying: Negative  Palpable tenderness: Tender to palpation of paraspinal musculature more in the lumbosacral area. FABER: negative. Sensory change: Gross sensation intact to all lumbar and sacral dermatomes.  Reflexes: 2+ at both patellar tendons, 2+ at achilles tendons, Babinski's downgoing.  Strength at foot  Plantar-flexion: 5/5 Dorsi-flexion: 5/5 Eversion: 5/5 Inversion: 5/5  Leg strength  Quad: 5/5 Hamstring: 5/5 Hip flexor: 5/5 Hip abductors: 5/5  Gait unremarkable.  Osteopathic findings C2 flexed rotated and side bent right C4 flexed rotated and side bent left C6 flexed rotated and side bent left T3 extended rotated and side bent  right inhaled third rib T9 extended rotated and side bent left L2 flexed rotated and side bent right Sacrum right on right    Impression and Recommendations:     This case required medical decision making of moderate complexity. The above documentation has been reviewed and is accurate and complete [name], DO       Note: This dictation was prepared with Dragon dictation along with smaller phrase technology. Any transcriptional errors that result from this process are unintentional.

## 2019-12-22 NOTE — Assessment & Plan Note (Signed)
Decision today to treat with OMT was based on Physical Exam  After verbal consent patient was treated with HVLA, ME, FPR techniques in cervical, thoracic, rib,  lumbar and sacral areas  Patient tolerated the procedure well with improvement in symptoms  Patient given exercises, stretches and lifestyle modifications  See medications in patient instructions if given  Patient will follow up in 4-8 weeks 

## 2019-12-29 ENCOUNTER — Other Ambulatory Visit: Payer: Self-pay

## 2019-12-29 ENCOUNTER — Ambulatory Visit: Payer: No Typology Code available for payment source | Attending: Internal Medicine

## 2019-12-29 DIAGNOSIS — Z20822 Contact with and (suspected) exposure to covid-19: Secondary | ICD-10-CM

## 2019-12-30 LAB — NOVEL CORONAVIRUS, NAA: SARS-CoV-2, NAA: NOT DETECTED

## 2020-01-25 MED FILL — PARoxetine HCL 10 MG TABS: 10 | 30 days supply | Qty: 30 | Fill #2

## 2020-01-25 MED FILL — ETONOGESTREL-ETHINYL ESTRAD: 0.12-0.015 | 84 days supply | Qty: 3 | Fill #0

## 2020-01-25 NOTE — Progress Notes (Signed)
[name] [address] [place] [address] Phone: [phone] Subjective:   I Stacy [name] am serving as a Neurosurgeon for Dr. Antoine Primas.  This visit occurred during the SARS-CoV-2 public health emergency.  Safety protocols were in place, including screening questions prior to the visit, additional usage of staff PPE, and extensive cleaning of exam room while observing appropriate contact time as indicated for disinfecting solutions.   I'm seeing this patient by the request  of:  Pincus Sanes, MD  CC: neck and back pain   WHQ:[id number]  Stacy [name] is a 29 y.o. female coming in with complaint of neck pain. Last seen 12/22/2019 for OMT. Patient states her neck is a little painful. Back is not as bad today.  Mild increase in tightness from time to time but nothing severe.  Patient denies any radiation of pain to the knee and extremities.    Past Medical History:  Diagnosis Date  . Anemia   . Hyperlipidemia    Past Surgical History:  Procedure Laterality Date  . WISDOM TOOTH EXTRACTION     Social History   Socioeconomic History  . Marital status: Single    Spouse name: Not on file  . Number of children: Not on file  . Years of education: Not on file  . Highest education level: Not on file  Occupational History  . Not on file  Tobacco Use  . Smoking status: Never Smoker  . Smokeless tobacco: Never Used  Substance and Sexual Activity  . Alcohol use: Yes  . Drug use: No  . Sexual activity: Not on file  Other Topics Concern  . Not on file  Social History Narrative  . Not on file   Social Determinants of Health   Financial Resource Strain:   . Difficulty of Paying Living Expenses:   Food Insecurity:   . Worried About Programme researcher, broadcasting/film/video in the Last Year:   . Barista in the Last Year:   Transportation Needs:   . Freight forwarder (Medical):   [name] Lack of Transportation (Non-Medical):   Physical Activity:   . Days  of Exercise per Week:   . Minutes of Exercise per Session:   Stress:   . Feeling of Stress :   Social Connections:   . Frequency of Communication with Friends and Family:   . Frequency of Social Gatherings with Friends and Family:   . Attends Religious Services:   . Active Member of Clubs or Organizations:   . Attends Banker Meetings:   [name] Marital Status:    Allergies  Allergen Reactions  . Doxycycline Other (See Comments)    Decreased WBC, Neutropenia   Family History  Problem Relation Age of Onset  . Diabetes Mother   . Autoimmune disease Mother   . Hyperlipidemia Father   . Cancer Maternal Grandmother        breast  . Breast cancer Maternal Grandmother   . Cancer Paternal Grandmother        breast  . Heart disease Paternal Grandmother   . Breast cancer Paternal Grandmother     Current Outpatient Medications (Endocrine & Metabolic):  .  etonogestrel-ethinyl estradiol (NUVARING) 0.12-0.015 MG/24HR vaginal ring, Place 1 each vaginally every 28 (twenty-eight) days. Insert vaginally and leave in place for 3 consecutive weeks, then remove for 1 week.   Current Outpatient Medications (Respiratory):  .  albuterol (PROVENTIL HFA;VENTOLIN HFA) 108 (90 Base) MCG/ACT inhaler, Inhale  2 puffs into the lungs every 4 (four) hours as needed for wheezing or shortness of breath (cough, shortness of breath or wheezing.). [name]  fluticasone (FLONASE) 50 MCG/ACT nasal spray, Place 2 sprays into both nostrils daily.   Current Outpatient Medications (Hematological):  .  ferrous sulfate 325 (65 FE) MG EC tablet, Take 325 mg by mouth daily.   Current Outpatient Medications (Other):  .  ketoconazole (NIZORAL) 2 % shampoo, Apply 1 application topically 2 (two) times a week. [name]  PARoxetine (PAXIL) 10 MG tablet, Take 1 tablet (10 mg total) by mouth daily. .  temazepam (RESTORIL) 7.5 MG capsule, Take 1 capsule (7.5 mg total) by mouth at bedtime as needed for sleep.   Reviewed prior  external information including notes and imaging from  primary care provider As well as notes that were available from care everywhere and other healthcare systems.  Past medical history, social, surgical and family history all reviewed in electronic medical record.  No pertanent information unless stated regarding to the chief complaint.   Review of Systems:  No headache, visual changes, nausea, vomiting, diarrhea, constipation, dizziness, abdominal pain, skin rash, fevers, chills, night sweats, weight loss, swollen lymph nodes, body aches, joint swelling, chest pain, shortness of breath, mood changes. POSITIVE muscle aches  Objective  Blood pressure 90/68, pulse 73, height 5\' 4"  (1.626 m), weight 125 lb (56.7 kg), SpO2 98 %.   General: No apparent distress alert and oriented x3 mood and affect normal, dressed appropriately.  HEENT: Pupils equal, extraocular movements intact  Respiratory: Patient's speak in full sentences and does not appear short of breath  Cardiovascular: No lower extremity edema, non tender, no erythema  Neuro: Cranial nerves II through XII are intact, neurovascularly intact in all extremities with 2+ DTRs and 2+ pulses.  Gait normal with good balance and coordination.  MSK:  Non tender with full range of motion and good stability and symmetric strength and tone of shoulders, elbows, wrist, hip, knee and ankles bilaterally.  Hypermobility noted.   TTP .  No midline/bony TTP. Limited rom in many planes 5 degrees less then baseline  BUE strength 5/5.   Sensation intact to light touch.   2+ equal reflexes in triceps, biceps, brachioradialis tendons. Negative spurlings. NV intact distal BUEs.    Back - Normal skin, Spine with normal alignment and no deformity.  No tenderness to vertebral process palpation.  Paraspinous muscles are not tender and without spasm.   + FABER on the right    Osteopathic findings  C2 flexed rotated and side bent right C6 flexed rotated and  side bent left T3 extended rotated and side bent right inhaled third rib T5 extended rotated and side bent left L2 flexed rotated and side bent right Sacrum right on right    Impression and Recommendations:     This case required medical decision making of moderate complexity. The above documentation has been reviewed and is accurate and complete [name], DO       Note: This dictation was prepared with Dragon dictation along with smaller phrase technology. Any transcriptional errors that result from this process are unintentional.

## 2020-01-26 ENCOUNTER — Encounter: Payer: Self-pay | Admitting: Family Medicine

## 2020-01-26 ENCOUNTER — Other Ambulatory Visit: Payer: Self-pay

## 2020-01-26 ENCOUNTER — Ambulatory Visit: Payer: No Typology Code available for payment source | Admitting: Family Medicine

## 2020-01-26 VITALS — BP 90/68 | HR 73 | Ht 64.0 in | Wt 125.0 lb

## 2020-01-26 DIAGNOSIS — M357 Hypermobility syndrome: Secondary | ICD-10-CM | POA: Diagnosis not present

## 2020-01-26 DIAGNOSIS — M999 Biomechanical lesion, unspecified: Secondary | ICD-10-CM | POA: Diagnosis not present

## 2020-01-26 NOTE — Patient Instructions (Signed)
See me again in 6-8 weeks ?

## 2020-01-26 NOTE — Assessment & Plan Note (Signed)

## 2020-01-26 NOTE — Assessment & Plan Note (Signed)
Patient is having more neck pain that seems to be more secondary to the hypermobility syndrome at the moment.  Responded fairly well to osteopathic manipulation.  Discussed which activities to doing which wants to avoid.  Patient seems to be doing better with some of her other medications.  Follow-up again 6 to 8 weeks

## 2020-02-24 ENCOUNTER — Other Ambulatory Visit: Payer: Self-pay | Admitting: Obstetrics and Gynecology

## 2020-02-24 DIAGNOSIS — N63 Unspecified lump in unspecified breast: Secondary | ICD-10-CM

## 2020-02-29 ENCOUNTER — Other Ambulatory Visit: Payer: Self-pay | Admitting: Internal Medicine

## 2020-02-29 MED FILL — PARoxetine HCL 10 MG TABS: 10 | 30 days supply | Qty: 30 | Fill #0

## 2020-03-02 ENCOUNTER — Ambulatory Visit
Admission: RE | Admit: 2020-03-02 | Discharge: 2020-03-02 | Disposition: A | Discharge status: Home / Self Care | Payer: No Typology Code available for payment source | Source: Ambulatory Visit | Attending: Obstetrics and Gynecology | Admitting: Obstetrics and Gynecology

## 2020-03-02 ENCOUNTER — Other Ambulatory Visit: Payer: Self-pay | Admitting: Obstetrics and Gynecology

## 2020-03-02 ENCOUNTER — Other Ambulatory Visit: Payer: Self-pay

## 2020-03-02 DIAGNOSIS — N6002 Solitary cyst of left breast: Secondary | ICD-10-CM

## 2020-03-02 DIAGNOSIS — N63 Unspecified lump in unspecified breast: Secondary | ICD-10-CM

## 2020-03-22 ENCOUNTER — Other Ambulatory Visit: Payer: Self-pay

## 2020-03-22 ENCOUNTER — Encounter: Payer: Self-pay | Admitting: Family Medicine

## 2020-03-22 ENCOUNTER — Ambulatory Visit: Payer: No Typology Code available for payment source | Admitting: Family Medicine

## 2020-03-22 VITALS — BP 110/80 | HR 88 | Ht 64.0 in | Wt 123.0 lb

## 2020-03-22 DIAGNOSIS — M999 Biomechanical lesion, unspecified: Secondary | ICD-10-CM

## 2020-03-22 DIAGNOSIS — M940 Chondrocostal junction syndrome [Tietze]: Secondary | ICD-10-CM | POA: Diagnosis not present

## 2020-03-22 NOTE — Progress Notes (Signed)
[name] [address] [place] [address] Phone: [phone] Subjective:   I [name] am serving as a Neurosurgeon for Dr. Antoine Primas.  This visit occurred during the SARS-CoV-2 public health emergency.  Safety protocols were in place, including screening questions prior to the visit, additional usage of staff PPE, and extensive cleaning of exam room while observing appropriate contact time as indicated for disinfecting solutions.   I'm seeing this patient by the request  of:  Pincus Sanes, MD  CC: Low back pain follow-up upper back pain follow-up  [id number]  Stacy [name] is a 29 y.o. female coming in with complaint of back and neck pain. OMT 01/26/2020. Patient states she is doing well.  Mild tightness.  Has had some difficulty when she has been in the yard for an extended amount of time where she feels like she has increasing weakness of the upper back.  Patient understands that and she has not been very well hydrated during that time.  Medications patient has been prescribed: Over-the-counter anti-inflammatories as needed          Reviewed prior external information including notes and imaging from previsou exam, outside providers and external EMR if available.   As well as notes that were available from care everywhere and other healthcare systems.  Past medical history, social, surgical and family history all reviewed in electronic medical record.  No pertanent information unless stated regarding to the chief complaint.   Past Medical History:  Diagnosis Date  . Anemia   . Hyperlipidemia     Allergies  Allergen Reactions  . Doxycycline Other (See Comments)    Decreased WBC, Neutropenia     Review of Systems:  No headache, visual changes, nausea, vomiting, diarrhea, constipation, dizziness, abdominal pain, skin rash, fevers, chills, night sweats, weight loss, swollen lymph nodes, body aches, joint swelling, chest pain,  shortness of breath, mood changes. POSITIVE muscle aches  Objective  Blood pressure 110/80, pulse 88, height 5\' 4"  (1.626 m), weight 123 lb (55.8 kg), SpO2 98 %.   General: No apparent distress alert and oriented x3 mood and affect normal, dressed appropriately.  HEENT: Pupils equal, extraocular movements intact  Respiratory: Patient's speak in full sentences and does not appear short of breath  Cardiovascular: No lower extremity edema, non tender, no erythema  Neuro: Cranial nerves II through XII are intact, neurovascularly intact in all extremities with 2+ DTRs and 2+ pulses.  Gait normal with good balance and coordination.  MSK:  Non tender with full range of motion and good stability and symmetric strength and tone of shoulders, elbows, wrist, hip, knee and ankles bilaterally.  Back - Normal skin, Spine with normal alignment and no deformity.  No tenderness to vertebral process palpation.  Paraspinous muscles are minimally tender and without spasm.   Range of motion is full at neck and lumbar sacral regions hypermobility noted  Osteopathic findings  C2 flexed rotated and side bent right T3 extended rotated and side bent right inhaled rib T8 extended rotated and side bent left L2 flexed rotated and side bent right Sacrum right on right     Assessment and Plan:    Nonallopathic problems  Decision today to treat with OMT was based on Physical Exam  After verbal consent patient was treated with HVLA, ME, FPR techniques in cervical, rib, thoracic, lumbar, and sacral  areas  Patient tolerated the procedure well with improvement in symptoms  Patient given exercises, stretches and lifestyle  modifications  See medications in patient instructions if given  Patient will follow up in 4-8 weeks      The above documentation has been reviewed and is accurate and complete Judi Saa, DO       Note: This dictation was prepared with Dragon dictation along with smaller phrase  technology. Any transcriptional errors that result from this process are unintentional.

## 2020-03-22 NOTE — Assessment & Plan Note (Signed)
Chronic problem with mild exacerbation.  Has been doing more yard work.  Discussed icing regimen and home exercise, which activities to do which wants to avoid.

## 2020-03-22 NOTE — Patient Instructions (Addendum)
Good to see you Overall not bad Make sure you eat before yard work See me again in 6 weeks

## 2020-04-12 MED FILL — PARoxetine HCL 10 MG TABS: 10 | 30 days supply | Qty: 30 | Fill #1

## 2020-04-21 NOTE — Patient Instructions (Addendum)
Blood work was ordered.    All other Health Maintenance issues reviewed.   All recommended immunizations and age-appropriate screenings are up-to-date or discussed.  No immunization administered today.   Medications reviewed and updated.  Changes include :  none   Your prescription(s) have been submitted to your pharmacy. Please take as directed and contact our office if you believe you are having problem(s) with the medication(s).  An ultrasound of your gallbladder was ordered.    Please followup in 1 year    Health Maintenance, Female Adopting a healthy lifestyle and getting preventive care are important in promoting health and wellness. Ask your health care provider about:  The right schedule for you to have regular tests and exams.  Things you can do on your own to prevent diseases and keep yourself healthy. What should I know about diet, weight, and exercise? Eat a healthy diet   Eat a diet that includes plenty of vegetables, fruits, low-fat dairy products, and lean protein.  Do not eat a lot of foods that are high in solid fats, added sugars, or sodium. Maintain a healthy weight Body mass index (BMI) is used to identify weight problems. It estimates body fat based on height and weight. Your health care provider can help determine your BMI and help you achieve or maintain a healthy weight. Get regular exercise Get regular exercise. This is one of the most important things you can do for your health. Most adults should:  Exercise for at least 150 minutes each week. The exercise should increase your heart rate and make you sweat (moderate-intensity exercise).  Do strengthening exercises at least twice a week. This is in addition to the moderate-intensity exercise.  Spend less time sitting. Even light physical activity can be beneficial. Watch cholesterol and blood lipids Have your blood tested for lipids and cholesterol at 29 years of age, then have this test every 5  years. Have your cholesterol levels checked more often if:  Your lipid or cholesterol levels are high.  You are older than 29 years of age.  You are at high risk for heart disease. What should I know about cancer screening? Depending on your health history and family history, you may need to have cancer screening at various ages. This may include screening for:  Breast cancer.  Cervical cancer.  Colorectal cancer.  Skin cancer.  Lung cancer. What should I know about heart disease, diabetes, and high blood pressure? Blood pressure and heart disease  High blood pressure causes heart disease and increases the risk of stroke. This is more likely to develop in people who have high blood pressure readings, are of African descent, or are overweight.  Have your blood pressure checked: ? Every 3-5 years if you are 71-41 years of age. ? Every year if you are 76 years old or older. Diabetes Have regular diabetes screenings. This checks your fasting blood sugar level. Have the screening done:  Once every three years after age 23 if you are at a normal weight and have a low risk for diabetes.  More often and at a younger age if you are overweight or have a high risk for diabetes. What should I know about preventing infection? Hepatitis B If you have a higher risk for hepatitis B, you should be screened for this virus. Talk with your health care provider to find out if you are at risk for hepatitis B infection. Hepatitis C Testing is recommended for:  Everyone born from [birth year] through 1965.  Anyone with known risk factors for hepatitis C. Sexually transmitted infections (STIs)  Get screened for STIs, including gonorrhea and chlamydia, if: ? You are sexually active and are younger than 29 years of age. ? You are older than 29 years of age and your health care provider tells you that you are at risk for this type of infection. ? Your sexual activity has changed since you were last  screened, and you are at increased risk for chlamydia or gonorrhea. Ask your health care provider if you are at risk.  Ask your health care provider about whether you are at high risk for HIV. Your health care provider may recommend a prescription medicine to help prevent HIV infection. If you choose to take medicine to prevent HIV, you should first get tested for HIV. You should then be tested every 3 months for as long as you are taking the medicine. Pregnancy  If you are about to stop having your period (premenopausal) and you may become pregnant, seek counseling before you get pregnant.  Take 400 to 800 micrograms (mcg) of folic acid every day if you become pregnant.  Ask for birth control (contraception) if you want to prevent pregnancy. Osteoporosis and menopause Osteoporosis is a disease in which the bones lose minerals and strength with aging. This can result in bone fractures. If you are 60 years old or older, or if you are at risk for osteoporosis and fractures, ask your health care provider if you should:  Be screened for bone loss.  Take a calcium or vitamin D supplement to lower your risk of fractures.  Be given hormone replacement therapy (HRT) to treat symptoms of menopause. Follow these instructions at home: Lifestyle  Do not use any products that contain nicotine or tobacco, such as cigarettes, e-cigarettes, and chewing tobacco. If you need help quitting, ask your health care provider.  Do not use street drugs.  Do not share needles.  Ask your health care provider for help if you need support or information about quitting drugs. Alcohol use  Do not drink alcohol if: ? Your health care provider tells you not to drink. ? You are pregnant, may be pregnant, or are planning to become pregnant.  If you drink alcohol: ? Limit how much you use to 0-1 drink a day. ? Limit intake if you are breastfeeding.  Be aware of how much alcohol is in your drink. In the [place] one  drink equals one 12 oz bottle of beer (355 mL), one 5 oz glass of wine (148 mL), or one 1 oz glass of hard liquor (44 mL). General instructions  Schedule regular health, dental, and eye exams.  Stay current with your vaccines.  Tell your health care provider if: ? You often feel depressed. ? You have ever been abused or do not feel safe at home. Summary  Adopting a healthy lifestyle and getting preventive care are important in promoting health and wellness.  Follow your health care provider's instructions about healthy diet, exercising, and getting tested or screened for diseases.  Follow your health care provider's instructions on monitoring your cholesterol and blood pressure. This information is not intended to replace advice given to you by your health care provider. Make sure you discuss any questions you have with your health care provider. Document Revised: 09/24/2018 Document Reviewed: 09/24/2018 Elsevier Patient Education  2020 [employer].

## 2020-04-21 NOTE — Progress Notes (Signed)
Subjective:    Patient ID: Stacy [name], female    DOB: [birth year], 29 y.o.   MRN: [id number]  HPI She is here for a physical exam.   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  She is very tired.    Medications and allergies reviewed with patient and updated if appropriate.  Patient Active Problem List   Diagnosis Date Noted  . Anxiety and depression 07/06/2019  . Difficulty sleeping 07/06/2019  . RUQ pain 04/16/2019  . Slipped rib syndrome 06/19/2018  . Nonallopathic lesion of sacral region 06/19/2018  . Nonallopathic lesion of rib cage 06/19/2018  . Palpitations 02/11/2018  . Hypermobility syndrome 03/25/2017  . Nonallopathic lesion of thoracic region 03/25/2017  . Nonallopathic lesion of cervical region 03/25/2017  . Nonallopathic lesion of lumbosacral region 03/25/2017  . Back pain 01/30/2017  . Dandruff 01/23/2016  . Iron deficiency anemia 01/23/2016    Current Outpatient Medications on File Prior to Visit  Medication Sig Dispense Refill  . albuterol (PROVENTIL HFA;VENTOLIN HFA) 108 (90 Base) MCG/ACT inhaler Inhale 2 puffs into the lungs every 4 (four) hours as needed for wheezing or shortness of breath (cough, shortness of breath or wheezing.). 1 Inhaler 1  . etonogestrel-ethinyl estradiol (NUVARING) 0.12-0.015 MG/24HR vaginal ring Place 1 each vaginally every 28 (twenty-eight) days. Insert vaginally and leave in place for 3 consecutive weeks, then remove for 1 week.    . ferrous sulfate 325 (65 FE) MG EC tablet Take 325 mg by mouth daily.     . fluticasone (FLONASE) 50 MCG/ACT nasal spray Place 2 sprays into both nostrils daily. 16 g 0  . ketoconazole (NIZORAL) 2 % shampoo Apply 1 application topically 2 (two) times a week. 120 mL 1  . PARoxetine (PAXIL) 10 MG tablet TAKE 1 TABLET BY MOUTH DAILY. 30 tablet 2  . temazepam (RESTORIL) 7.5 MG capsule Take 1 capsule (7.5 mg total) by mouth at bedtime as needed for sleep.  30 capsule 0  . Vitamin D, Ergocalciferol, (DRISDOL) 1.25 MG (50000 UNIT) CAPS capsule Take by mouth.     No current facility-administered medications on file prior to visit.    Past Medical History:  Diagnosis Date  . Anemia   . Hyperlipidemia     Past Surgical History:  Procedure Laterality Date  . WISDOM TOOTH EXTRACTION      Social History   Socioeconomic History  . Marital status: Single    Spouse name: Not on file  . Number of children: Not on file  . Years of education: Not on file  . Highest education level: Not on file  Occupational History  . Not on file  Tobacco Use  . Smoking status: Never Smoker  . Smokeless tobacco: Never Used  Substance and Sexual Activity  . Alcohol use: Yes  . Drug use: No  . Sexual activity: Not on file  Other Topics Concern  . Not on file  Social History Narrative  . Not on file   Social Determinants of Health   Financial Resource Strain:   . Difficulty of Paying Living Expenses:   Food Insecurity:   . Worried About Programme researcher, broadcasting/film/video in the Last Year:   . Barista in the Last Year:   Transportation Needs:   . Freight forwarder (Medical):   [name] Lack of Transportation (Non-Medical):   Physical Activity:   . Days of Exercise per Week:   . Minutes  of Exercise per Session:   Stress:   . Feeling of Stress :   Social Connections:   . Frequency of Communication with Friends and Family:   . Frequency of Social Gatherings with Friends and Family:   . Attends Religious Services:   . Active Member of Clubs or Organizations:   . Attends Banker Meetings:   [name] Marital Status:     Family History  Problem Relation Age of Onset  . Diabetes Mother   . Autoimmune disease Mother   . Breast cancer Mother        85's DCIS  . Hyperlipidemia Father   . Cancer Maternal Grandmother        breast  . Breast cancer Maternal Grandmother   . Cancer Paternal Grandmother        breast  . Heart disease Paternal  Grandmother   . Breast cancer Paternal Grandmother     Review of Systems  Constitutional: Positive for diaphoresis (occ at night) and fatigue. Negative for chills and fever.  Eyes: Negative for visual disturbance.  Respiratory: Negative for cough, shortness of breath and wheezing.   Cardiovascular: Positive for palpitations (occ, stress related with mild tightness). Negative for chest pain and leg swelling.  Gastrointestinal: Positive for abdominal pain (occ RUQ dull pain - greasy foods). Negative for blood in stool, constipation, diarrhea and nausea.       Occ GERD  Genitourinary: Negative for dysuria and hematuria.  Musculoskeletal: Positive for neck pain. Negative for arthralgias and back pain.  Skin: Negative for color change and rash.  Neurological: Negative for light-headedness and headaches.  Psychiatric/Behavioral: Positive for dysphoric mood and sleep disturbance (occ). The patient is nervous/anxious.        Objective:   Vitals:   04/22/20 0912  BP: 108/74  Pulse: 95  Temp: 98.1 F (36.7 C)  SpO2: 96%   Filed Weights   04/22/20 0912  Weight: 122 lb (55.3 kg)   Body mass index is 20.94 kg/m.  BP Readings from Last 3 Encounters:  04/22/20 108/74  03/22/20 110/80  01/26/20 90/68    Wt Readings from Last 3 Encounters:  04/22/20 122 lb (55.3 kg)  03/22/20 123 lb (55.8 kg)  01/26/20 125 lb (56.7 kg)     Physical Exam Constitutional: She appears well-developed and well-nourished. No distress.  HENT:  Head: Normocephalic and atraumatic.  Right Ear: External ear normal. Normal ear canal and TM Left Ear: External ear normal.  Normal ear canal and TM Mouth/Throat: Oropharynx is clear and moist.  Eyes: Conjunctivae and EOM are normal.  Neck: Neck supple. No tracheal deviation present. No thyromegaly present.  No carotid bruit  Cardiovascular: Normal rate, regular rhythm and normal heart sounds.   No murmur heard.  No edema. Pulmonary/Chest: Effort normal and  breath sounds normal. No respiratory distress. She has no wheezes. She has no rales.  Breast: deferred   Abdominal: Soft. She exhibits no distension. There is no tenderness.  Lymphadenopathy: She has no cervical adenopathy.  Skin: Skin is warm and dry. She is not diaphoretic. Normal appearing moles Psychiatric: She has a normal mood and affect. Her behavior is normal.        Assessment & Plan:   Physical exam: Screening blood work    ordered Immunizations   Up to date  Gyn   Up to date  Exercise  Walking some Weight  normal Substance abuse    none  See Problem List for Assessment and Plan of chronic  medical problems.   This visit occurred during the SARS-CoV-2 public health emergency.  Safety protocols were in place, including screening questions prior to the visit, additional usage of staff PPE, and extensive cleaning of exam room while observing appropriate contact time as indicated for disinfecting solutions.

## 2020-04-22 ENCOUNTER — Encounter: Payer: Self-pay | Admitting: Internal Medicine

## 2020-04-22 ENCOUNTER — Ambulatory Visit (INDEPENDENT_AMBULATORY_CARE_PROVIDER_SITE_OTHER): Payer: No Typology Code available for payment source | Admitting: Internal Medicine

## 2020-04-22 ENCOUNTER — Other Ambulatory Visit: Payer: Self-pay

## 2020-04-22 VITALS — BP 108/74 | HR 95 | Temp 98.1°F | Ht 64.0 in | Wt 122.0 lb

## 2020-04-22 DIAGNOSIS — G479 Sleep disorder, unspecified: Secondary | ICD-10-CM

## 2020-04-22 DIAGNOSIS — L210 Seborrhea capitis: Secondary | ICD-10-CM

## 2020-04-22 DIAGNOSIS — F32A Depression, unspecified: Secondary | ICD-10-CM

## 2020-04-22 DIAGNOSIS — D509 Iron deficiency anemia, unspecified: Secondary | ICD-10-CM | POA: Diagnosis not present

## 2020-04-22 DIAGNOSIS — F329 Major depressive disorder, single episode, unspecified: Secondary | ICD-10-CM

## 2020-04-22 DIAGNOSIS — Z0000 Encounter for general adult medical examination without abnormal findings: Secondary | ICD-10-CM | POA: Diagnosis not present

## 2020-04-22 DIAGNOSIS — F419 Anxiety disorder, unspecified: Secondary | ICD-10-CM | POA: Diagnosis not present

## 2020-04-22 DIAGNOSIS — R1011 Right upper quadrant pain: Secondary | ICD-10-CM

## 2020-04-22 DIAGNOSIS — Z833 Family history of diabetes mellitus: Secondary | ICD-10-CM

## 2020-04-22 DIAGNOSIS — Z1159 Encounter for screening for other viral diseases: Secondary | ICD-10-CM

## 2020-04-22 MED ORDER — KETOCONAZOLE 2 % EX SHAM: 1.0000 "application " | MEDICATED_SHAMPOO | CUTANEOUS | 5 refills | Status: DC

## 2020-04-22 MED FILL — KETOCONAZOLE 2% SHAMPOO: 2 | 30 days supply | Qty: 120 | Fill #0

## 2020-04-22 NOTE — Assessment & Plan Note (Signed)
Chronic Taking iron a couple of times a week - often 2 tabs when she does take it Check iron, cbc

## 2020-04-22 NOTE — Assessment & Plan Note (Signed)
Chronic intermittent Continue ketoconazole prn

## 2020-04-22 NOTE — Assessment & Plan Note (Signed)
Chronic Controlled, stable Continue current dose of medication paxil 10 mg daily 

## 2020-04-22 NOTE — Assessment & Plan Note (Signed)
Chronic Taking restoril on occasion - not often Works well Continue as needed

## 2020-04-22 NOTE — Assessment & Plan Note (Signed)
a1c

## 2020-04-25 LAB — LIPID PANEL
Cholesterol: 228 mg/dL — ABNORMAL HIGH (ref <200)
HDL: 85 mg/dL (ref >=50)
LDL Cholesterol (Calc): 121 mg/dL (calc) — ABNORMAL HIGH
Non-HDL Cholesterol (Calc): 143 mg/dL (calc) — ABNORMAL HIGH (ref <130)
Total CHOL/HDL Ratio: 2.7 (calc) (ref <5.0)
Triglycerides: 112 mg/dL (ref <150)

## 2020-04-25 LAB — CBC WITH DIFFERENTIAL/PLATELET
Absolute Monocytes: 327 cells/uL (ref 200–950)
Basophils Absolute: 30 cells/uL (ref 0–200)
Basophils Relative: 0.8 %
Eosinophils Absolute: 68 cells/uL (ref 15–500)
Eosinophils Relative: 1.8 %
HCT: 40.2 % (ref 35.0–45.0)
Hemoglobin: 13.2 g/dL (ref 11.7–15.5)
Lymphs Abs: 1262 cells/uL (ref 850–3900)
MCH: 29.6 pg (ref 27.0–33.0)
MCHC: 32.8 g/dL (ref 32.0–36.0)
MCV: 90.1 fL (ref 80.0–100.0)
MPV: 10.4 fL (ref 7.5–12.5)
Monocytes Relative: 8.6 %
Neutro Abs: 2113 cells/uL (ref 1500–7800)
Neutrophils Relative %: 55.6 %
Platelets: 267 10*3/uL (ref 140–400)
RBC: 4.46 10*6/uL (ref 3.80–5.10)
RDW: 13.1 % (ref 11.0–15.0)
Total Lymphocyte: 33.2 %
WBC: 3.8 10*3/uL (ref 3.8–10.8)

## 2020-04-25 LAB — HEMOGLOBIN A1C
Hgb A1c MFr Bld: 4.9 % of total Hgb (ref <5.7)
Mean Plasma Glucose: 94 (calc)
eAG (mmol/L): 5.2 (calc)

## 2020-04-25 LAB — COMPREHENSIVE METABOLIC PANEL
AG Ratio: 1.3 (calc) (ref 1.0–2.5)
ALT: 8 U/L (ref 6–29)
AST: 17 U/L (ref 10–30)
Albumin: 4.4 g/dL (ref 3.6–5.1)
Alkaline phosphatase (APISO): 41 U/L (ref 31–125)
BUN: 9 mg/dL (ref 7–25)
CO2: 23 mmol/L (ref 20–32)
Calcium: 10 mg/dL (ref 8.6–10.2)
Chloride: 102 mmol/L (ref 98–110)
Creat: 0.77 mg/dL (ref 0.50–1.10)
Globulin: 3.4 g/dL (calc) (ref 1.9–3.7)
Glucose, Bld: 83 mg/dL (ref 65–99)
Potassium: 4.5 mmol/L (ref 3.5–5.3)
Sodium: 138 mmol/L (ref 135–146)
Total Bilirubin: 0.5 mg/dL (ref 0.2–1.2)
Total Protein: 7.8 g/dL (ref 6.1–8.1)

## 2020-04-25 LAB — IRON,TIBC AND FERRITIN PANEL
%SAT: 38 % (calc) (ref 16–45)
Ferritin: 15 ng/mL — ABNORMAL LOW (ref 16–154)
Iron: 164 ug/dL (ref 40–190)
TIBC: 431 mcg/dL (calc) (ref 250–450)

## 2020-04-25 LAB — HEPATITIS C ANTIBODY
Hepatitis C Ab: NONREACTIVE
SIGNAL TO CUT-OFF: 0.01 (ref <1.00)

## 2020-05-03 ENCOUNTER — Ambulatory Visit: Payer: No Typology Code available for payment source | Admitting: Family Medicine

## 2020-05-09 ENCOUNTER — Ambulatory Visit: Payer: No Typology Code available for payment source | Admitting: Family Medicine

## 2020-05-10 MED FILL — KETOCONAZOLE 2% SHAMPOO: 2 | 30 days supply | Qty: 120 | Fill #0

## 2020-05-10 MED FILL — ETONOGESTREL-ETHINYL ESTRAD: 0.12-0.015 | 84 days supply | Qty: 3 | Fill #1

## 2020-06-01 ENCOUNTER — Ambulatory Visit
Admission: RE | Admit: 2020-06-01 | Discharge: 2020-06-01 | Disposition: A | Discharge status: Home / Self Care | Payer: No Typology Code available for payment source | Source: Ambulatory Visit | Attending: Internal Medicine | Admitting: Internal Medicine

## 2020-06-01 DIAGNOSIS — R1011 Right upper quadrant pain: Secondary | ICD-10-CM

## 2020-06-07 ENCOUNTER — Ambulatory Visit: Payer: No Typology Code available for payment source | Admitting: Family Medicine

## 2020-06-07 ENCOUNTER — Encounter: Payer: Self-pay | Admitting: Family Medicine

## 2020-06-07 ENCOUNTER — Other Ambulatory Visit: Payer: Self-pay | Admitting: Internal Medicine

## 2020-06-07 ENCOUNTER — Other Ambulatory Visit: Payer: Self-pay

## 2020-06-07 VITALS — BP 104/64 | HR 73 | Ht 64.0 in | Wt 124.0 lb

## 2020-06-07 DIAGNOSIS — M999 Biomechanical lesion, unspecified: Secondary | ICD-10-CM

## 2020-06-07 DIAGNOSIS — M940 Chondrocostal junction syndrome [Tietze]: Secondary | ICD-10-CM

## 2020-06-07 MED ORDER — TIZANIDINE HCL 4 MG PO TABS
4.0000 mg | ORAL_TABLET | Freq: Every day | ORAL | 0 refills | Status: DC
Start: 2020-06-07 — End: 2021-12-13

## 2020-06-07 MED ORDER — TEMAZEPAM 7.5 MG PO CAPS: 7.5000 mg | ORAL_CAPSULE | Freq: Every evening | ORAL | 0 refills | Status: DC | PRN

## 2020-06-07 MED FILL — PARoxetine HCL 10 MG TABS: 10 | 30 days supply | Qty: 30 | Fill #2

## 2020-06-07 MED FILL — TEMAZEPAM 7.5 MG CAPSULE: 7.5 | 30 days supply | Qty: 30 | Fill #0

## 2020-06-07 MED FILL — tiZANidine HCL 4 MG TABS: 4 | 30 days supply | Qty: 30 | Fill #0

## 2020-06-07 NOTE — Assessment & Plan Note (Signed)
Increasing tightness of the upper back and neck.  Discussed that it is more in the left side.  More like an exacerbation.  Given Zanaflex and encourage patient not to take the Restoril at night.  Increase activity slowly.  Follow-up again in 4 to 8 weeks

## 2020-06-07 NOTE — Patient Instructions (Signed)
Zanaflex for next 3 nights Keep working on posture See me again in 6 weeks

## 2020-06-07 NOTE — Progress Notes (Signed)
[name] [address] [place] [address] Phone: [phone] Subjective:   Bruce Donath, am serving as a scribe for Dr. Antoine Primas. This visit occurred during the SARS-CoV-2 public health emergency.  Safety protocols were in place, including screening questions prior to the visit, additional usage of staff PPE, and extensive cleaning of exam room while observing appropriate contact time as indicated for disinfecting solutions.   I'm seeing this patient by the request  of:  Pincus Sanes, MD  CC: Neck pain and upper back pain follow-up  [id number]  Stacy [name] is a 29 y.o. female coming in with complaint of back and neck pain. OMT 03/22/2020. Patient states that her neck pain increased since last visit on left side. Denies any radiating symptoms. Pain for past 3-4 weeks.   Medications patient has been prescribed: Has been given muscle relaxers but has not been taking it          Reviewed prior external information including notes and imaging from previsou exam, outside providers and external EMR if available.   As well as notes that were available from care everywhere and other healthcare systems.  Past medical history, social, surgical and family history all reviewed in electronic medical record.  No pertanent information unless stated regarding to the chief complaint.   Past Medical History:  Diagnosis Date  . Anemia   . Hyperlipidemia     Allergies  Allergen Reactions  . Doxycycline Other (See Comments)    Decreased WBC, Neutropenia     Review of Systems:  No headache, visual changes, nausea, vomiting, diarrhea, constipation, dizziness, abdominal pain, skin rash, fevers, chills, night sweats, weight loss, swollen lymph nodes, body aches, joint swelling, chest pain, shortness of breath, mood changes. POSITIVE muscle aches  Objective  There were no vitals taken for this visit.   General: No apparent distress alert and  oriented x3 mood and affect normal, dressed appropriately.  HEENT: Pupils equal, extraocular movements intact  Respiratory: Patient's speak in full sentences and does not appear short of breath  Cardiovascular: No lower extremity edema, non tender, no erythema  Neuro: Cranial nerves II through XII are intact, neurovascularly intact in all extremities with 2+ DTRs and 2+ pulses.  Gait normal with good balance and coordination.  MSK:  Non tender with full range of motion and good stability and symmetric strength and tone of shoulders, elbows, wrist, hip, knee and ankles bilaterally.  Hypermobility noted multiple joints Neck exam does have some mild tenderness to palpation more on the left side of the neck as well as the left upper trapezius and levator scapula.  Some mild decrease in range of motion with side bending of the neck.  Tightness noted on the left side.  Negative Spurling's.  Osteopathic findings  C2 flexed rotated and side bent right C6 flexed rotated and side bent left T3 extended rotated and side bent left inhaled rib T7 extended rotated and side bent left L2 flexed rotated and side bent right Sacrum right on right       Assessment and Plan:  Slipped rib syndrome Increasing tightness of the upper back and neck.  Discussed that it is more in the left side.  More like an exacerbation.  Given Zanaflex and encourage patient not to take the Restoril at night.  Increase activity slowly.  Follow-up again in 4 to 8 weeks    Nonallopathic problems  Decision today to treat with OMT was based on Physical Exam  After verbal consent patient was treated with HVLA, ME, FPR techniques in cervical, rib, thoracic, lumbar, and sacral  areas  Patient tolerated the procedure well with improvement in symptoms  Patient given exercises, stretches and lifestyle modifications  See medications in patient instructions if given  Patient will follow up in 4-8 weeks      The above  documentation has been reviewed and is accurate and complete Judi Saa, DO       Note: This dictation was prepared with Dragon dictation along with smaller phrase technology. Any transcriptional errors that result from this process are unintentional.

## 2020-07-19 ENCOUNTER — Encounter: Payer: Self-pay | Admitting: Family Medicine

## 2020-07-19 ENCOUNTER — Ambulatory Visit: Payer: No Typology Code available for payment source | Admitting: Family Medicine

## 2020-07-19 ENCOUNTER — Other Ambulatory Visit: Payer: Self-pay

## 2020-07-19 VITALS — BP 102/68 | HR 69 | Ht 64.0 in | Wt 127.0 lb

## 2020-07-19 DIAGNOSIS — M999 Biomechanical lesion, unspecified: Secondary | ICD-10-CM | POA: Diagnosis not present

## 2020-07-19 DIAGNOSIS — M940 Chondrocostal junction syndrome [Tietze]: Secondary | ICD-10-CM | POA: Diagnosis not present

## 2020-07-19 NOTE — Progress Notes (Signed)
[name] [address] [place] [address] Phone: [phone] Subjective:   Stacy [name], am serving as a scribe for Dr. Antoine Primas. This visit occurred during the SARS-CoV-2 public health emergency.  Safety protocols were in place, including screening questions prior to the visit, additional usage of staff PPE, and extensive cleaning of exam room while observing appropriate contact time as indicated for disinfecting solutions.   I'm seeing this patient by the request  of:  Pincus Sanes, MD  CC: Back and neck pain follow-up  [id number]  Stacy [name] is a 29 y.o. female coming in with complaint of back and neck pain. OMT 06/07/2020. Patient states   Medications patient has been prescribed: Zanaflex Taking:         Reviewed prior external information including notes and imaging from previsou exam, outside providers and external EMR if available.   As well as notes that were available from care everywhere and other healthcare systems.  Past medical history, social, surgical and family history all reviewed in electronic medical record.  No pertanent information unless stated regarding to the chief complaint.   Past Medical History:  Diagnosis Date  . Anemia   . Hyperlipidemia     Allergies  Allergen Reactions  . Doxycycline Other (See Comments)    Decreased WBC, Neutropenia     Review of Systems:  No headache, visual changes, nausea, vomiting, diarrhea, constipation, dizziness, abdominal pain, skin rash, fevers, chills, night sweats, weight loss, swollen lymph nodes, body aches, joint swelling, chest pain, shortness of breath, mood changes. POSITIVE muscle aches  Objective  Blood pressure 102/68, pulse 69, height 5\' 4"  (1.626 m), weight 127 lb (57.6 kg), SpO2 96 %.   General: No apparent distress alert and oriented x3 mood and affect normal, dressed appropriately.  HEENT: Pupils equal, extraocular movements intact    Respiratory: Patient's speak in full sentences and does not appear short of breath  Cardiovascular: No lower extremity edema, non tender, no erythema  Neuro: Cranial nerves II through XII are intact, neurovascularly intact in all extremities with 2+ DTRs and 2+ pulses.  Gait normal with good balance and coordination.  MSK:  Non tender with full range of motion and good stability and symmetric strength and tone of shoulders, elbows, wrist, hip, knee and ankles bilaterally.  Hypermobility noted. Back -patient does have some mild tightness noted of the lumbar spine.  Patient does have more tightness in the parascapular region right greater than left.  Slight bruising is noted.  Osteopathic findings  C7 flexed rotated and side bent left T3 extended rotated and side bent right inhaled rib T8 extended rotated and side bent left L2 flexed rotated and side bent right Sacrum right on right       Assessment and Plan:  Slipped rib syndrome Chronic problem with mild exacerbation.  Patient does have the hypermobility and iron deficiency that I think contribute.  Discussed posture and, which activities to do which wants to avoid.  Increase activity slowly.  Discussed ergonomics at work.  Follow-up again in 2 months    Nonallopathic problems  Decision today to treat with OMT was based on Physical Exam  After verbal consent patient was treated with HVLA, ME, FPR techniques in cervical, rib, thoracic, lumbar, and sacral  areas  Patient tolerated the procedure well with improvement in symptoms  Patient given exercises, stretches and lifestyle modifications  See medications in patient instructions if given  Patient will follow up in  8 weeks      The above documentation has been reviewed and is accurate and complete Judi Saa, DO       Note: This dictation was prepared with Dragon dictation along with smaller phrase technology. Any transcriptional errors that result from this  process are unintentional.

## 2020-07-19 NOTE — Assessment & Plan Note (Signed)
Chronic problem with mild exacerbation.  Patient does have the hypermobility and iron deficiency that I think contribute.  Discussed posture and, which activities to do which wants to avoid.  Increase activity slowly.  Discussed ergonomics at work.  Follow-up again in 2 months

## 2020-07-19 NOTE — Patient Instructions (Signed)
Have a great time at [facility] you are better See me in 7-8 weeks

## 2020-09-05 ENCOUNTER — Other Ambulatory Visit: Payer: No Typology Code available for payment source

## 2020-09-06 ENCOUNTER — Other Ambulatory Visit: Payer: Self-pay | Admitting: Obstetrics and Gynecology

## 2020-09-06 ENCOUNTER — Ambulatory Visit
Admission: RE | Admit: 2020-09-06 | Discharge: 2020-09-06 | Disposition: A | Discharge status: Home / Self Care | Payer: No Typology Code available for payment source | Source: Ambulatory Visit | Attending: Obstetrics and Gynecology | Admitting: Obstetrics and Gynecology

## 2020-09-06 ENCOUNTER — Other Ambulatory Visit: Payer: Self-pay

## 2020-09-06 ENCOUNTER — Other Ambulatory Visit: Payer: No Typology Code available for payment source

## 2020-09-06 DIAGNOSIS — N6002 Solitary cyst of left breast: Secondary | ICD-10-CM

## 2020-09-13 ENCOUNTER — Ambulatory Visit: Payer: No Typology Code available for payment source | Admitting: Family Medicine

## 2020-09-21 ENCOUNTER — Encounter: Payer: Self-pay | Admitting: Family Medicine

## 2020-09-21 ENCOUNTER — Ambulatory Visit: Payer: No Typology Code available for payment source | Admitting: Family Medicine

## 2020-09-21 ENCOUNTER — Other Ambulatory Visit: Payer: Self-pay

## 2020-09-21 VITALS — BP 102/80 | HR 86 | Ht 64.0 in | Wt 130.0 lb

## 2020-09-21 DIAGNOSIS — M940 Chondrocostal junction syndrome [Tietze]: Secondary | ICD-10-CM | POA: Diagnosis not present

## 2020-09-21 DIAGNOSIS — M999 Biomechanical lesion, unspecified: Secondary | ICD-10-CM | POA: Diagnosis not present

## 2020-09-21 NOTE — Patient Instructions (Signed)
Making great strides See me again in 2 months

## 2020-09-21 NOTE — Assessment & Plan Note (Signed)
Continue mild difficulty.  Chronic problem with mild exacerbation.  Zanaflex.  We discussed using it as needed.  Patient has responded well to manipulation.  Patient continues to stay very active and is very healthy.  Follow-up with me again 8 weeks

## 2020-09-21 NOTE — Progress Notes (Signed)
[name] [address] [place] [address] Phone: [phone] Subjective:   I Stacy [name] am serving as a Neurosurgeon for Dr. Antoine Primas.  This visit occurred during the SARS-CoV-2 public health emergency.  Safety protocols were in place, including screening questions prior to the visit, additional usage of staff PPE, and extensive cleaning of exam room while observing appropriate contact time as indicated for disinfecting solutions.   I'm seeing this patient by the request  of:  Pincus Sanes, MD  CC: Back pain and neck pain follow-up  [id number]  Stacy [name] is a 29 y.o. female coming in with complaint of back and neck pain Patient states her shoulder/upper back feels out. Overall doing well.  Mild discomfort overall.  Has been 2 months since we have seen patient.  Patient continues to be fairly active.  He did do a lot of traveling and thinks that that could contribute to some of the increase in mild neck pain that she is having.         Reviewed prior external information including notes and imaging from previsou exam, outside providers and external EMR if available.   As well as notes that were available from care everywhere and other healthcare systems.  Past medical history, social, surgical and family history all reviewed in electronic medical record.  No pertanent information unless stated regarding to the chief complaint.   Past Medical History:  Diagnosis Date  . Anemia   . Hyperlipidemia     Allergies  Allergen Reactions  . Doxycycline Other (See Comments)    Decreased WBC, Neutropenia     Review of Systems:  No headache, visual changes, nausea, vomiting, diarrhea, constipation, dizziness, abdominal pain, skin rash, fevers, chills, night sweats, weight loss, swollen lymph nodes, body aches, joint swelling, chest pain, shortness of breath, mood changes. POSITIVE muscle aches  Objective  There were no vitals taken  for this visit.   General: No apparent distress alert and oriented x3 mood and affect normal, dressed appropriately.  HEENT: Pupils equal, extraocular movements intact  Respiratory: Patient's speak in full sentences and does not appear short of breath  Cardiovascular: No lower extremity edema, non tender, no erythema  Neuro: Cranial nerves II through XII are intact, neurovascularly intact in all extremities with 2+ DTRs and 2+ pulses.  Gait normal with good balance and coordination.  MSK:  Non tender with full range of motion and good stability and symmetric strength and tone of shoulders, elbows, wrist, hip, knee and ankles bilaterally.  Hypermobility noted Back -tightness noted in the right parascapular region more around the T3-T4 level.  Patient does have some mild tightness of the left side of the neck.  Negative Spurling's.  Osteopathic findings  C6 flexed rotated and side bent left T3 extended rotated and side bent right inhaled rib T9 extended rotated and side bent left L2 flexed rotated and side bent right Sacrum right on right       Assessment and Plan:  Slipped rib syndrome Continue mild difficulty.  Chronic problem with mild exacerbation.  Zanaflex.  We discussed using it as needed.  Patient has responded well to manipulation.  Patient continues to stay very active and is very healthy.  Follow-up with me again 8 weeks     Nonallopathic problems  Decision today to treat with OMT was based on Physical Exam  After verbal consent patient was treated with HVLA, ME, FPR techniques in cervical, rib, thoracic, lumbar, and sacral  areas  Patient tolerated the procedure well with improvement in symptoms  Patient given exercises, stretches and lifestyle modifications  See medications in patient instructions if given  Patient will follow up in 4-8 weeks      The above documentation has been reviewed and is accurate and complete [name], DO       Note: This  dictation was prepared with Dragon dictation along with smaller phrase technology. Any transcriptional errors that result from this process are unintentional.

## 2020-10-24 ENCOUNTER — Other Ambulatory Visit: Payer: Self-pay

## 2020-10-24 ENCOUNTER — Other Ambulatory Visit: Payer: No Typology Code available for payment source

## 2020-10-24 DIAGNOSIS — Z20822 Contact with and (suspected) exposure to covid-19: Secondary | ICD-10-CM

## 2020-10-26 LAB — SARS-COV-2, NAA 2 DAY TAT

## 2020-10-26 LAB — NOVEL CORONAVIRUS, NAA: SARS-CoV-2, NAA: NOT DETECTED

## 2020-11-22 NOTE — Progress Notes (Signed)
[name] [address] [place] [address] Phone: [phone] Subjective:   Stacy [name], am serving as a scribe for Dr. Antoine Primas.  This visit occurred during the SARS-CoV-2 public health emergency.  Safety protocols were in place, including screening questions prior to the visit, additional usage of staff PPE, and extensive cleaning of exam room while observing appropriate contact time as indicated for disinfecting solutions.    I'm seeing this patient by the request  of:  Pincus Sanes, MD  CC: Neck and back pain follow-up  [id number]  Stacy [name] is a 30 y.o. female coming in with complaint of back and neck pain. OMT 09/21/2021. Patient states that her neck especially the left side up the neck is slightly tight and trying to pop it but nothing is helping keep it set.   Medications patient has been prescribed: Zanaflex Taking:         Reviewed prior external information including notes and imaging from previsou exam, outside providers and external EMR if available.   As well as notes that were available from care everywhere and other healthcare systems.  Past medical history, social, surgical and family history all reviewed in electronic medical record.  No pertanent information unless stated regarding to the chief complaint.   Past Medical History:  Diagnosis Date  . Anemia   . Hyperlipidemia     Allergies  Allergen Reactions  . Doxycycline Other (See Comments)    Decreased WBC, Neutropenia     Review of Systems:  No headache, visual changes, nausea, vomiting, diarrhea, constipation, dizziness, abdominal pain, skin rash, fevers, chills, night sweats, weight loss, swollen lymph nodes, body aches, joint swelling, chest pain, shortness of breath, mood changes. POSITIVE muscle aches  Objective  Blood pressure 110/76, pulse 86, height 5\' 4"  (1.626 m), weight 131 lb (59.4 kg), SpO2 97 %.   General: No apparent  distress alert and oriented x3 mood and affect normal, dressed appropriately.  HEENT: Pupils equal, extraocular movements intact  Respiratory: Patient's speak in full sentences and does not appear short of breath  Cardiovascular: No lower extremity edema, non tender, no erythema  Neuro: Cranial nerves II through XII are intact, neurovascularly intact in all extremities with 2+ DTRs and 2+ pulses.  Gait normal with good balance and coordination.  MSK:  Non tender with full range of motion and good stability and symmetric strength and tone of shoulders, elbows, wrist, hip, knee and ankles bilaterally.  Hypermobility noted. Neck exam mild tightness more around the cervical thoracic area.  Tightness of the left parascapular region noted.  Negative Spurling's.  5 out of 5 Strength of the upper extremities. Back mild tightness of the low back noted.  Patient has negative and negative straight leg test.  Osteopathic findings  C2 flexed rotated and side bent left C5 flexed rotated and side bent left T3 extended rotated and side bent right inhaled rib L1 flexed rotated and side bent right Sacrum right on right       Assessment and Plan:  Slipped rib syndrome Continues to get some difficulty.  Patient continues to have actually the hypermobility giving her trouble as well.  Discussed icing regimen and home exercises, we discussed which activities to do which wants to avoid.  Increase activity slowly.  Discussed continuing the ergonomic changes at work.  Does respond well to manipulation and will follow up again in 6 weeks    Nonallopathic problems  Decision today to treat  with OMT was based on Physical Exam  After verbal consent patient was treated with HVLA, ME, FPR techniques in cervical, rib, thoracic, lumbar, and sacral  areas  Patient tolerated the procedure well with improvement in symptoms  Patient given exercises, stretches and lifestyle modifications  See medications in  patient instructions if given  Patient will follow up in 4-8 weeks      The above documentation has been reviewed and is accurate and complete Judi Saa, DO       Note: This dictation was prepared with Dragon dictation along with smaller phrase technology. Any transcriptional errors that result from this process are unintentional.

## 2020-11-23 ENCOUNTER — Encounter: Payer: Self-pay | Admitting: Family Medicine

## 2020-11-23 ENCOUNTER — Other Ambulatory Visit: Payer: Self-pay

## 2020-11-23 ENCOUNTER — Ambulatory Visit: Payer: No Typology Code available for payment source | Admitting: Family Medicine

## 2020-11-23 VITALS — BP 110/76 | HR 86 | Ht 64.0 in | Wt 131.0 lb

## 2020-11-23 DIAGNOSIS — M999 Biomechanical lesion, unspecified: Secondary | ICD-10-CM | POA: Diagnosis not present

## 2020-11-23 DIAGNOSIS — M940 Chondrocostal junction syndrome [Tietze]: Secondary | ICD-10-CM | POA: Diagnosis not present

## 2020-11-23 NOTE — Assessment & Plan Note (Signed)
Continues to get some difficulty.  Patient continues to have actually the hypermobility giving her trouble as well.  Discussed icing regimen and home exercises, we discussed which activities to do which wants to avoid.  Increase activity slowly.  Discussed continuing the ergonomic changes at work.  Does respond well to manipulation and will follow up again in 6 weeks

## 2020-11-23 NOTE — Patient Instructions (Addendum)
Good to see you  Keep working on the posture Watch the quilting ;) See me again in 6-8 weeks

## 2020-12-13 NOTE — Progress Notes (Signed)
Subjective:    Patient ID: Stacy [name], female    DOB: [birth year], 30 y.o.   MRN: [id number]  HPI The patient is here for follow up of their chronic medical problems, including sleeping difficulty  She came off the paxil late fall - early winter.  It was hard to get off of.  She thinks she may need something for her anxiety but would prefer something different.    She thinks she has some SAD.  She has some anxiety.    She restarted the paxil at 5 mg about one week just so she was on something.    Sleep is sometimes bad - typically can fall asleep ok but can not stay asleep.  restoril does not work great - she tried her mom's ativan and it worked well.   Medications and allergies reviewed with patient and updated if appropriate.  Patient Active Problem List   Diagnosis Date Noted  . Family history of diabetes mellitus (DM) 04/22/2020  . Anxiety and depression 07/06/2019  . Difficulty sleeping 07/06/2019  . RUQ pain 04/16/2019  . Slipped rib syndrome 06/19/2018  . Nonallopathic lesion of sacral region 06/19/2018  . Nonallopathic lesion of rib cage 06/19/2018  . Palpitations 02/11/2018  . Hypermobility syndrome 03/25/2017  . Nonallopathic lesion of thoracic region 03/25/2017  . Nonallopathic lesion of cervical region 03/25/2017  . Nonallopathic lesion of lumbosacral region 03/25/2017  . Back pain 01/30/2017  . Dandruff 01/23/2016  . Iron deficiency anemia 01/23/2016    Current Outpatient Medications on File Prior to Visit  Medication Sig Dispense Refill  . albuterol (PROVENTIL HFA;VENTOLIN HFA) 108 (90 Base) MCG/ACT inhaler Inhale 2 puffs into the lungs every 4 (four) hours as needed for wheezing or shortness of breath (cough, shortness of breath or wheezing.). 1 Inhaler 1  . etonogestrel-ethinyl estradiol (NUVARING) 0.12-0.015 MG/24HR vaginal ring Place 1 each vaginally every 28 (twenty-eight) days. Insert vaginally and leave in place for 3 consecutive weeks, then remove  for 1 week.    . ferrous sulfate 325 (65 FE) MG EC tablet Take 325 mg by mouth daily.     . fluticasone (FLONASE) 50 MCG/ACT nasal spray Place 2 sprays into both nostrils daily. 16 g 0  . ketoconazole (NIZORAL) 2 % shampoo Apply 1 application topically 2 (two) times a week. 120 mL 5  . tiZANidine (ZANAFLEX) 4 MG tablet Take 1 tablet (4 mg total) by mouth at bedtime. 30 tablet 0   No current facility-administered medications on file prior to visit.    Past Medical History:  Diagnosis Date  . Anemia   . Hyperlipidemia     Past Surgical History:  Procedure Laterality Date  . WISDOM TOOTH EXTRACTION      Social History   Socioeconomic History  . Marital status: Single    Spouse name: Not on file  . Number of children: Not on file  . Years of education: Not on file  . Highest education level: Not on file  Occupational History  . Not on file  Tobacco Use  . Smoking status: Never Smoker  . Smokeless tobacco: Never Used  Substance and Sexual Activity  . Alcohol use: Yes  . Drug use: No  . Sexual activity: Not on file  Other Topics Concern  . Not on file  Social History Narrative  . Not on file   Social Determinants of Health   Financial Resource Strain: Not on file  Food Insecurity: Not on file  Transportation Needs: Not  on file  Physical Activity: Not on file  Stress: Not on file  Social Connections: Not on file    Family History  Problem Relation Age of Onset  . Diabetes Mother   . Autoimmune disease Mother   . Breast cancer Mother        70's DCIS  . Hyperlipidemia Father   . Cancer Maternal Grandmother        breast  . Breast cancer Maternal Grandmother   . Cancer Paternal Grandmother        breast  . Heart disease Paternal Grandmother   . Breast cancer Paternal Grandmother     Review of Systems     Objective:   Vitals:   12/14/20 1518  BP: 112/78  Pulse: 100  Temp: 98.6 F (37 C)  SpO2: 98%   BP Readings from Last 3 Encounters:  12/14/20  112/78  11/23/20 110/76  09/21/20 102/80   Wt Readings from Last 3 Encounters:  12/14/20 132 lb (59.9 kg)  11/23/20 131 lb (59.4 kg)  09/21/20 130 lb (59 kg)   Body mass index is 22.66 kg/m.   Physical Exam    Constitutional: Appears well-developed and well-nourished. No distress.  HENT:  Head: Normocephalic and atraumatic.  Skin: Skin is warm and dry. Not diaphoretic.  Psychiatric: Normal mood and affect. Behavior is normal.      Assessment & Plan:    See Problem List for Assessment and Plan of chronic medical problems.    This visit occurred during the SARS-CoV-2 public health emergency.  Safety protocols were in place, including screening questions prior to the visit, additional usage of staff PPE, and extensive cleaning of exam room while observing appropriate contact time as indicated for disinfecting solutions.

## 2020-12-14 ENCOUNTER — Encounter: Payer: Self-pay | Admitting: Internal Medicine

## 2020-12-14 ENCOUNTER — Other Ambulatory Visit: Payer: Self-pay

## 2020-12-14 ENCOUNTER — Ambulatory Visit (INDEPENDENT_AMBULATORY_CARE_PROVIDER_SITE_OTHER): Payer: No Typology Code available for payment source | Admitting: Internal Medicine

## 2020-12-14 ENCOUNTER — Other Ambulatory Visit: Payer: Self-pay | Admitting: Internal Medicine

## 2020-12-14 VITALS — BP 112/78 | HR 100 | Temp 98.6°F | Ht 64.0 in | Wt 132.0 lb

## 2020-12-14 DIAGNOSIS — G479 Sleep disorder, unspecified: Secondary | ICD-10-CM

## 2020-12-14 DIAGNOSIS — F419 Anxiety disorder, unspecified: Secondary | ICD-10-CM | POA: Diagnosis not present

## 2020-12-14 DIAGNOSIS — F32A Depression, unspecified: Secondary | ICD-10-CM

## 2020-12-14 MED ORDER — ESCITALOPRAM OXALATE 5 MG PO TABS
5.0000 mg | ORAL_TABLET | Freq: Every day | ORAL | 5 refills | Status: DC
Start: 2020-12-14 — End: 2020-12-14

## 2020-12-14 MED ORDER — LORAZEPAM 0.5 MG PO TABS
0.5000 mg | ORAL_TABLET | Freq: Every evening | ORAL | 0 refills | Status: DC | PRN
Start: 2020-12-14 — End: 2020-12-14

## 2020-12-14 MED ORDER — PROPRANOLOL HCL 10 MG PO TABS: 10.0000 mg | ORAL_TABLET | Freq: Every day | ORAL | 0 refills | Status: DC | PRN

## 2020-12-14 NOTE — Assessment & Plan Note (Addendum)
Chronic Intermittent Able to fall asleep, but often can not stay asleep - related to anxiety Temazepam not working well - will d/c Has tried lorazepam and it worked well Start lorazepam 0.5 mg hs prn - will try to use infrequently Discussed sleep issues often related to anxiety and to make sure anxiety is well controlled and avoid over treating sleep issues

## 2020-12-14 NOTE — Patient Instructions (Addendum)
Start lexapro 5 mg daily.  Taper off the paxil slowly.     Take the lorazepam 0.5 mg at night if needed.  Try to avoid taking it if possible.      Try the propranolol 10 mg if needed for presentations.

## 2020-12-15 NOTE — Assessment & Plan Note (Addendum)
Chronic SAD and generalized anxiety Currently on paxil 5 mg daily and she will taper off that Will start lexapro 5 mg daily - can increase if needed

## 2021-01-17 NOTE — Progress Notes (Signed)
[name] [address] [place] [address] Phone: [phone] Subjective:   Bruce Donath, am serving as a scribe for Dr. Antoine Primas. This visit occurred during the SARS-CoV-2 public health emergency.  Safety protocols were in place, including screening questions prior to the visit, additional usage of staff PPE, and extensive cleaning of exam room while observing appropriate contact time as indicated for disinfecting solutions.   I'm seeing this patient by the request  of:  Pincus Sanes, MD  CC: Low back and neck pain follow-up  [id number]  Stacy [name] is a 30 y.o. female coming in with complaint of back and neck pain. OMT 11/23/2020. Patient states that she was having upper back pain since we last saw her. Patient tried massage. Is feeling much better but still feels somewhat off. Using IBU for pain relief.   Medications patient has been prescribed: None          Reviewed prior external information including notes and imaging from previsou exam, outside providers and external EMR if available.   As well as notes that were available from care everywhere and other healthcare systems.  Past medical history, social, surgical and family history all reviewed in electronic medical record.  No pertanent information unless stated regarding to the chief complaint.   Past Medical History:  Diagnosis Date  . Anemia   . Hyperlipidemia     Allergies  Allergen Reactions  . Doxycycline Other (See Comments)    Decreased WBC, Neutropenia     Review of Systems:  No headache, visual changes, nausea, vomiting, diarrhea, constipation, dizziness, abdominal pain, skin rash, fevers, chills, night sweats, weight loss, swollen lymph nodes, body aches, joint swelling, chest pain, shortness of breath, mood changes. POSITIVE muscle aches  Objective  Blood pressure 116/88, pulse (!) 106, height 5\' 4"  (1.626 m), weight 132 lb (59.9 kg), SpO2 97 %.    General: No apparent distress alert and oriented x3 mood and affect normal, dressed appropriately.  HEENT: Pupils equal, extraocular movements intact  Respiratory: Patient's speak in full sentences and does not appear short of breath  Cardiovascular: No lower extremity edema, non tender, no erythema  Gait normal with good balance and coordination.   Back -hypermobility of multiple joints Neck exam shows the patient does have some very mild loss of lordosis.  Good range of motion with side bending and extension.  Mild tightness noted in the right parascapular region.  Tightness noted in the right midportion of the back as well.  Mild positive .  Negative straight leg test  Osteopathic findings  C2 flexed rotated and side bent right C6 flexed rotated and side bent left T3 extended rotated and side bent right inhaled rib T9 extended rotated and side bent left L2 flexed rotated and side bent right Sacrum right on right       Assessment and Plan:  Slipped rib syndrome Patient does have a slipped rib syndrome but is responding very well to the manipulation.  Discussed which activities to do which wants to avoid. Hypermobility likely contributes to muscle discomfort and pain.  Gave patient some other exercises that I think will be beneficial.  Patient does have muscle relaxer for breakthrough pain.  Follow-up with me again 6 to 8 weeks.    Nonallopathic problems  Decision today to treat with OMT was based on Physical Exam  After verbal consent patient was treated with HVLA, ME, FPR techniques in cervical, rib, thoracic, lumbar, and sacral  areas  Patient tolerated the procedure well with improvement in symptoms  Patient given exercises, stretches and lifestyle modifications  See medications in patient instructions if given  Patient will follow up in 4-8 weeks      The above documentation has been reviewed and is accurate and complete Judi Saa, DO       Note:  This dictation was prepared with Dragon dictation along with smaller phrase technology. Any transcriptional errors that result from this process are unintentional.

## 2021-01-18 ENCOUNTER — Other Ambulatory Visit: Payer: Self-pay

## 2021-01-18 ENCOUNTER — Encounter: Payer: Self-pay | Admitting: Family Medicine

## 2021-01-18 ENCOUNTER — Ambulatory Visit (INDEPENDENT_AMBULATORY_CARE_PROVIDER_SITE_OTHER): Payer: No Typology Code available for payment source | Admitting: Family Medicine

## 2021-01-18 VITALS — BP 116/88 | HR 106 | Ht 64.0 in | Wt 132.0 lb

## 2021-01-18 DIAGNOSIS — M999 Biomechanical lesion, unspecified: Secondary | ICD-10-CM

## 2021-01-18 DIAGNOSIS — M940 Chondrocostal junction syndrome [Tietze]: Secondary | ICD-10-CM | POA: Diagnosis not present

## 2021-01-18 NOTE — Assessment & Plan Note (Signed)
Patient does have a slipped rib syndrome but is responding very well to the manipulation.  Discussed which activities to do which wants to avoid. Hypermobility likely contributes to muscle discomfort and pain.  Gave patient some other exercises that I think will be beneficial.  Patient does have muscle relaxer for breakthrough pain.  Follow-up with me again 6 to 8 weeks.

## 2021-01-18 NOTE — Patient Instructions (Signed)
Keep driving office mate crazy Have fun at [facility]  See me in 6-8 weeks

## 2021-02-01 ENCOUNTER — Other Ambulatory Visit: Payer: Self-pay | Admitting: Obstetrics and Gynecology

## 2021-02-06 ENCOUNTER — Other Ambulatory Visit: Payer: Self-pay

## 2021-02-07 ENCOUNTER — Other Ambulatory Visit: Payer: Self-pay

## 2021-02-08 ENCOUNTER — Other Ambulatory Visit: Payer: Self-pay

## 2021-02-08 MED ORDER — ETONOGESTREL-ETHINYL ESTRADIOL 0.12-0.015 MG/24HR VA RING
VAGINAL_RING | VAGINAL | 1 refills | Status: DC
  Filled 2021-02-08: qty 2, 56d supply, fill #0

## 2021-02-08 MED FILL — Escitalopram Oxalate Tab 5 MG (Base Equiv): ORAL | 30 days supply | Qty: 30 | Fill #0 | Status: AC

## 2021-03-02 ENCOUNTER — Other Ambulatory Visit: Payer: Self-pay | Admitting: Internal Medicine

## 2021-03-02 ENCOUNTER — Other Ambulatory Visit: Payer: Self-pay

## 2021-03-02 DIAGNOSIS — J069 Acute upper respiratory infection, unspecified: Secondary | ICD-10-CM

## 2021-03-02 MED ORDER — ETONOGESTREL-ETHINYL ESTRADIOL 0.12-0.015 MG/24HR VA RING
VAGINAL_RING | VAGINAL | 4 refills | Status: DC
  Filled 2021-03-02: qty 3, 84d supply, fill #0
  Filled 2021-03-16: qty 1, 28d supply, fill #0
  Filled 2021-04-13: qty 3, 84d supply, fill #0
  Filled 2021-06-26: qty 3, 84d supply, fill #1
  Filled 2021-09-17: qty 3, 84d supply, fill #2
  Filled 2021-12-04: qty 3, 84d supply, fill #3
  Filled 2022-03-01: qty 3, 84d supply, fill #4

## 2021-03-03 ENCOUNTER — Other Ambulatory Visit: Payer: Self-pay | Admitting: Obstetrics and Gynecology

## 2021-03-03 ENCOUNTER — Other Ambulatory Visit: Payer: Self-pay

## 2021-03-03 ENCOUNTER — Other Ambulatory Visit: Payer: Self-pay | Admitting: Internal Medicine

## 2021-03-03 DIAGNOSIS — N63 Unspecified lump in unspecified breast: Secondary | ICD-10-CM

## 2021-03-03 MED FILL — Lorazepam Tab 0.5 MG: ORAL | 30 days supply | Qty: 30 | Fill #0 | Status: AC

## 2021-03-06 ENCOUNTER — Other Ambulatory Visit: Payer: Self-pay

## 2021-03-08 ENCOUNTER — Ambulatory Visit
Admission: RE | Admit: 2021-03-08 | Discharge: 2021-03-08 | Disposition: A | Discharge status: Home / Self Care | Payer: No Typology Code available for payment source | Source: Ambulatory Visit | Attending: Obstetrics and Gynecology | Admitting: Obstetrics and Gynecology

## 2021-03-08 ENCOUNTER — Other Ambulatory Visit: Payer: Self-pay

## 2021-03-08 DIAGNOSIS — N6002 Solitary cyst of left breast: Secondary | ICD-10-CM

## 2021-03-08 DIAGNOSIS — N63 Unspecified lump in unspecified breast: Secondary | ICD-10-CM

## 2021-03-14 ENCOUNTER — Other Ambulatory Visit: Payer: Self-pay | Admitting: Internal Medicine

## 2021-03-14 DIAGNOSIS — J069 Acute upper respiratory infection, unspecified: Secondary | ICD-10-CM

## 2021-03-15 ENCOUNTER — Other Ambulatory Visit: Payer: Self-pay

## 2021-03-15 MED FILL — Escitalopram Oxalate Tab 5 MG (Base Equiv): ORAL | 30 days supply | Qty: 30 | Fill #1 | Status: AC

## 2021-03-16 ENCOUNTER — Other Ambulatory Visit: Payer: Self-pay

## 2021-03-22 ENCOUNTER — Ambulatory Visit: Payer: No Typology Code available for payment source | Admitting: Family Medicine

## 2021-03-23 ENCOUNTER — Other Ambulatory Visit: Payer: Self-pay

## 2021-03-23 ENCOUNTER — Other Ambulatory Visit: Payer: Self-pay | Admitting: Internal Medicine

## 2021-03-23 DIAGNOSIS — J069 Acute upper respiratory infection, unspecified: Secondary | ICD-10-CM

## 2021-03-23 MED ORDER — FLUTICASONE PROPIONATE 50 MCG/ACT NA SUSP
2.0000 | Freq: Every day | NASAL | 5 refills | Status: DC
  Filled 2021-03-23 – 2021-08-07 (×2): qty 16, 30d supply, fill #0

## 2021-03-24 ENCOUNTER — Other Ambulatory Visit: Payer: Self-pay

## 2021-03-29 ENCOUNTER — Telehealth (INDEPENDENT_AMBULATORY_CARE_PROVIDER_SITE_OTHER): Payer: No Typology Code available for payment source | Admitting: Family Medicine

## 2021-03-29 ENCOUNTER — Other Ambulatory Visit (HOSPITAL_COMMUNITY): Payer: Self-pay

## 2021-03-29 DIAGNOSIS — J209 Acute bronchitis, unspecified: Secondary | ICD-10-CM

## 2021-03-29 MED ORDER — HYDROCOD POLST-CPM POLST ER 10-8 MG/5ML PO SUER
5.0000 mL | Freq: Two times a day (BID) | ORAL | 0 refills | Status: DC | PRN
  Filled 2021-03-29: qty 115, 12d supply, fill #0

## 2021-03-29 NOTE — Progress Notes (Signed)
Patient ID: Stacy [name], female   DOB: 15-Aug-1991, 30 y.o.   MRN: [id number]  This visit type was conducted due to national recommendations for restrictions regarding the COVID-19 pandemic in an effort to limit this patient's exposure and mitigate transmission in our community.   Virtual Visit via Video Note  I connected with [name] on 03/29/21 at  1:15 PM EDT by a video enabled telemedicine application and verified that I am speaking with the correct person using two identifiers.  Location patient: home Location provider:work or home office Persons participating in the virtual visit: patient, provider  I discussed the limitations of evaluation and management by telemedicine and the availability of in person appointments. The patient expressed understanding and agreed to proceed.   HPI:  Magaly seen with recent sore throat and now predominant symptom of cough.  She returned recently from trip to [place].  She started feeling poorly on the seventh.  She had COVID test last Thursday which was negative and then repeated again yesterday and again negative.  Her cough is mostly nonproductive.  Did have a phase of being slightly productive earlier.  She tried some Delsym last night which helped slightly.  She has had fairly severe cough at night.  Interfering with sleep.  She tried albuterol without much improvement.  No fever.  No chills.  No chronic lung disease.  Does have some right-sided rib soreness to palpation which she thinks is related to coughing.  No dyspnea.  No calf or leg pain or asymmetric leg swelling.   ROS: See pertinent positives and negatives per HPI.  Past Medical History:  Diagnosis Date   Anemia    Hyperlipidemia     Past Surgical History:  Procedure Laterality Date   WISDOM TOOTH EXTRACTION      Family History  Problem Relation Age of Onset   Diabetes Mother    Autoimmune disease Mother    Breast cancer Mother        53's DCIS   Hyperlipidemia Father     Cancer Maternal Grandmother        breast   Breast cancer Maternal Grandmother    Cancer Paternal Grandmother        breast   Heart disease Paternal Grandmother    Breast cancer Paternal Grandmother     SOCIAL HX: Non-smoker   Current Outpatient Medications:    albuterol (PROVENTIL HFA;VENTOLIN HFA) 108 (90 Base) MCG/ACT inhaler, Inhale 2 puffs into the lungs every 4 (four) hours as needed for wheezing or shortness of breath (cough, shortness of breath or wheezing.)., Disp: 1 Inhaler, Rfl: 1   chlorpheniramine-HYDROcodone (TUSSIONEX PENNKINETIC ER) 10-8 MG/5ML SUER, Take 5 mLs by mouth every 12 (twelve) hours as needed for cough., Disp: 115 mL, Rfl: 0   escitalopram (LEXAPRO) 5 MG tablet, TAKE 1 TABLET BY MOUTH DAILY, Disp: 30 tablet, Rfl: 5   etonogestrel-ethinyl estradiol (NUVARING) 0.12-0.015 MG/24HR vaginal ring, INSERT 1 RING VAGINALLY ONCE EVERY 4 WEEKS; LEAVE IN FOR 3 WEEKS AND REMOVE FOR 1 WEEK OR AS DIRECTED, Disp: 3 each, Rfl: 4   etonogestrel-ethinyl estradiol (NUVARING) 0.12-0.015 MG/24HR vaginal ring, Place 1 each vaginally every 28 (twenty-eight) days. Insert vaginally and leave in place for 3 consecutive weeks, then remove for 1 week., Disp: , Rfl:    ferrous sulfate 325 (65 FE) MG EC tablet, Take 325 mg by mouth daily. , Disp: , Rfl:    fluticasone (FLONASE) 50 MCG/ACT nasal spray, Place 2 sprays into both nostrils daily., Disp: 16  g, Rfl: 5   ketoconazole (NIZORAL) 2 % shampoo, Apply 1 application topically 2 (two) times a week., Disp: 120 mL, Rfl: 5   LORazepam (ATIVAN) 0.5 MG tablet, TAKE 1 TABLET BY MOUTH AT BEDTIME AS NEEDED FOR ANXIETY OR SLEEP, Disp: 30 tablet, Rfl: 0   propranolol (INDERAL) 10 MG tablet, TAKE 1 TABLET BY MOUTH DAILY AS NEEDED (PRESENTATIONS)., Disp: 30 tablet, Rfl: 0   tiZANidine (ZANAFLEX) 4 MG tablet, Take 1 tablet (4 mg total) by mouth at bedtime., Disp: 30 tablet, Rfl: 0  EXAM:  VITALS per patient if applicable:  GENERAL: alert, oriented,  appears well and in no acute distress  HEENT: atraumatic, conjunttiva clear, no obvious abnormalities on inspection of external nose and ears  NECK: normal movements of the head and neck  LUNGS: on inspection no signs of respiratory distress, breathing rate appears normal, no obvious gross SOB, gasping or wheezing  CV: no obvious cyanosis  MS: moves all visible extremities without noticeable abnormality  PSYCH/NEURO: pleasant and cooperative, no obvious depression or anxiety, speech and thought processing grossly intact  ASSESSMENT AND PLAN:  Discussed the following assessment and plan:  Acute bronchitis, unspecified organism  Patient denies any red flag symptoms such as fever, dyspnea, pleuritic pain.  Suspect acute viral bronchitis.  Patient has continued cough not helped much with over-the-counter medications  -We agreed to Tussionex 1 teaspoon nightly.  She has taken this in the past with good success.  She is aware this may be sedating.  Patient is a Teacher, early years/pre. -Follow-up for any persistent cough or any new symptoms such as fever or dyspnea    I discussed the assessment and treatment plan with the patient. The patient was provided an opportunity to ask questions and all were answered. The patient agreed with the plan and demonstrated an understanding of the instructions.   The patient was advised to call back or seek an in-person evaluation if the symptoms worsen or if the condition fails to improve as anticipated.     Evelena Peat, MD

## 2021-04-05 ENCOUNTER — Encounter: Payer: Self-pay | Admitting: Internal Medicine

## 2021-04-05 ENCOUNTER — Other Ambulatory Visit (HOSPITAL_COMMUNITY): Payer: Self-pay

## 2021-04-05 ENCOUNTER — Other Ambulatory Visit: Payer: Self-pay

## 2021-04-05 ENCOUNTER — Ambulatory Visit (INDEPENDENT_AMBULATORY_CARE_PROVIDER_SITE_OTHER): Payer: No Typology Code available for payment source | Admitting: Internal Medicine

## 2021-04-05 DIAGNOSIS — F419 Anxiety disorder, unspecified: Secondary | ICD-10-CM

## 2021-04-05 DIAGNOSIS — R079 Chest pain, unspecified: Secondary | ICD-10-CM

## 2021-04-05 DIAGNOSIS — F32A Depression, unspecified: Secondary | ICD-10-CM | POA: Diagnosis not present

## 2021-04-05 DIAGNOSIS — R0781 Pleurodynia: Secondary | ICD-10-CM | POA: Insufficient documentation

## 2021-04-05 MED ORDER — TRAMADOL HCL 50 MG PO TABS
50.0000 mg | ORAL_TABLET | Freq: Four times a day (QID) | ORAL | 0 refills | Status: DC | PRN
  Filled 2021-04-05: qty 30, 7d supply, fill #0

## 2021-04-05 NOTE — Progress Notes (Signed)
Patient ID: Stacy [name], female   DOB: [birth year], 30 y.o.   MRN: [id number]        Chief Complaint: follow up right chest and rib pain       HPI:  Stacy [name] is a 30 y.o. female here with recent trip to [place] where she began some type of upper resp illness and scant prod cough on June 13 with cough occasionally severe and resulted in a pop now persistent pain to the right lateral chest below the axilla but also the whole right chest even to the costal margin seems painful and tender; has been trying to get by on cough drops but pain now 6/10, constant, sharp and pleuritic though illness itself seems to have resolved;  was covid neg x 3; ibuprofen and cool compressed not working; nothing else seems to make better or worse.   Pt denies fever, wt loss, night sweats, loss of appetite, or other constitutional symptoms  no other trauma or falls.  Pt denies other chest pain, increased sob or doe, wheezing, orthopnea, PND, increased LE swelling, palpitations, dizziness or syncope.  Pt denies polydipsia, polyuria, or new focal neuro s/s.        Wt Readings from Last 3 Encounters:  04/05/21 131 lb (59.4 kg)  01/18/21 132 lb (59.9 kg)  12/14/20 132 lb (59.9 kg)   BP Readings from Last 3 Encounters:  04/05/21 124/76  01/18/21 116/88  12/14/20 112/78         Past Medical History:  Diagnosis Date   Anemia    Hyperlipidemia    Past Surgical History:  Procedure Laterality Date   WISDOM TOOTH EXTRACTION      reports that she has never smoked. She has never used smokeless tobacco. She reports current alcohol use. She reports that she does not use drugs. family history includes Autoimmune disease in her mother; Breast cancer in her maternal grandmother, mother, and paternal grandmother; Cancer in her maternal grandmother and paternal grandmother; Diabetes in her mother; Heart disease in her paternal grandmother; Hyperlipidemia in her father. Allergies  Allergen Reactions   Doxycycline Other (See  Comments)    Decreased WBC, Neutropenia   Current Outpatient Medications on File Prior to Visit  Medication Sig Dispense Refill   albuterol (PROVENTIL HFA;VENTOLIN HFA) 108 (90 Base) MCG/ACT inhaler Inhale 2 puffs into the lungs every 4 (four) hours as needed for wheezing or shortness of breath (cough, shortness of breath or wheezing.). 1 Inhaler 1   chlorpheniramine-HYDROcodone (TUSSIONEX PENNKINETIC ER) 10-8 MG/5ML SUER Take 5 mLs by mouth every 12 (twelve) hours as needed for cough. 115 mL 0   escitalopram (LEXAPRO) 5 MG tablet TAKE 1 TABLET BY MOUTH DAILY 30 tablet 5   etonogestrel-ethinyl estradiol (NUVARING) 0.12-0.015 MG/24HR vaginal ring INSERT 1 RING VAGINALLY ONCE EVERY 4 WEEKS; LEAVE IN FOR 3 WEEKS AND REMOVE FOR 1 WEEK OR AS DIRECTED 3 each 4   etonogestrel-ethinyl estradiol (NUVARING) 0.12-0.015 MG/24HR vaginal ring Place 1 each vaginally every 28 (twenty-eight) days. Insert vaginally and leave in place for 3 consecutive weeks, then remove for 1 week.     ferrous sulfate 325 (65 FE) MG EC tablet Take 325 mg by mouth daily.      fluticasone (FLONASE) 50 MCG/ACT nasal spray Place 2 sprays into both nostrils daily. 16 g 5   ketoconazole (NIZORAL) 2 % shampoo Apply 1 application topically 2 (two) times a week. 120 mL 5   LORazepam (ATIVAN) 0.5 MG tablet TAKE 1 TABLET BY MOUTH AT  BEDTIME AS NEEDED FOR ANXIETY OR SLEEP 30 tablet 0   propranolol (INDERAL) 10 MG tablet TAKE 1 TABLET BY MOUTH DAILY AS NEEDED (PRESENTATIONS). 30 tablet 0   tiZANidine (ZANAFLEX) 4 MG tablet Take 1 tablet (4 mg total) by mouth at bedtime. 30 tablet 0   No current facility-administered medications on file prior to visit.        ROS:  All others reviewed and negative.  Objective        PE:  BP 124/76 (BP Location: Left Arm, Patient Position: Sitting, Cuff Size: Normal)   Pulse (!) 105   Ht 5\' 4"  (1.626 m)   Wt 131 lb (59.4 kg)   SpO2 96%   BMI 22.49 kg/m                 Constitutional: Pt appears in  NAD               HENT: Head: NCAT.                Right Ear: External ear normal.                 Left Ear: External ear normal.                Eyes: . Pupils are equal, round, and reactive to light. Conjunctivae and EOM are normal               Nose: without d/c or deformity               Neck: Neck supple. Gross normal ROM               Cardiovascular: Normal rate and regular rhythm.                 Pulmonary/Chest: Effort normal and breath sounds without rales or wheezing.                Right chest wall with diffuse mod tenderness worse at the lateral chest under the axilla and lower costal margin below the breast as well               Abd:  Soft, NT, ND, + BS, no organomegaly               Neurological: Pt is alert. At baseline orientation, motor grossly intact               Skin: Skin is warm. No rashes, no other new lesions, LE edema - none               Psychiatric: Pt behavior is normal without agitation , 1-2+ anxiety  Micro: none  Cardiac tracings I have personally interpreted today:  none  Pertinent Radiological findings (summarize): none   Lab Results  Component Value Date   WBC 3.8 04/22/2020   HGB 13.2 04/22/2020   HCT 40.2 04/22/2020   PLT 267 04/22/2020   GLUCOSE 83 04/22/2020   CHOL 228 (H) 04/22/2020   TRIG 112 04/22/2020   HDL 85 04/22/2020   LDLCALC 121 (H) 04/22/2020   ALT 8 04/22/2020   AST 17 04/22/2020   NA 138 04/22/2020   K 4.5 04/22/2020   CL 102 04/22/2020   CREATININE 0.77 04/22/2020   BUN 9 04/22/2020   CO2 23 04/22/2020   TSH 1.36 04/16/2019   HGBA1C 4.9 04/22/2020   Assessment/Plan:  Stacy [name] is a 30 y.o. Black or African American [2] female with  has a past medical history of Anemia and Hyperlipidemia.  Rib pain on right side With tenderness - for rib films  Chest pain I suspect is significant msk injury from forceful coughing, declines cough med now, also for CXR r/o lower resp tract illness such as pna, for tramadol prn  which also helps with cough,  to f/u any worsening symptoms or concerns  Anxiety and depression Mild worsening situational, reassured, pt declines need for further counseling or tx.at this time  Followup: Return if symptoms worsen or fail to improve.  Oliver Barre, MD 04/09/2021 4:14 PM [facility] - [facility]

## 2021-04-05 NOTE — Patient Instructions (Signed)
Please take all new medication as prescribed - the tramadol as needed for pain  Please continue all other medications as before  Please have the pharmacy call with any other refills you may need.  Please keep your appointments with your specialists as you may have planned  Please go to the XRAY Department in the first floor for the x-ray testing  You will be contacted by phone if any changes need to be made immediately.  Otherwise, you will receive a letter about your results with an explanation, but please check with MyChart first.  Please remember to sign up for MyChart if you have not done so, as this will be important to you in the future with finding out test results, communicating by private email, and scheduling acute appointments online when needed.

## 2021-04-06 ENCOUNTER — Ambulatory Visit
Admission: RE | Admit: 2021-04-06 | Discharge: 2021-04-06 | Disposition: A | Discharge status: Home / Self Care | Payer: No Typology Code available for payment source | Attending: Internal Medicine | Admitting: Internal Medicine

## 2021-04-06 ENCOUNTER — Ambulatory Visit
Admission: RE | Admit: 2021-04-06 | Discharge: 2021-04-06 | Disposition: A | Discharge status: Home / Self Care | Payer: No Typology Code available for payment source | Source: Ambulatory Visit | Attending: Internal Medicine | Admitting: Internal Medicine

## 2021-04-06 DIAGNOSIS — R0781 Pleurodynia: Secondary | ICD-10-CM

## 2021-04-07 ENCOUNTER — Encounter: Payer: Self-pay | Admitting: Internal Medicine

## 2021-04-09 ENCOUNTER — Encounter: Payer: Self-pay | Admitting: Internal Medicine

## 2021-04-09 DIAGNOSIS — R079 Chest pain, unspecified: Secondary | ICD-10-CM | POA: Insufficient documentation

## 2021-04-09 NOTE — Assessment & Plan Note (Signed)
Mild worsening situational, reassured, pt declines need for further counseling or tx.at this time

## 2021-04-09 NOTE — Assessment & Plan Note (Signed)
With tenderness - for rib films

## 2021-04-09 NOTE — Assessment & Plan Note (Addendum)
I suspect is significant msk injury from forceful coughing, declines cough med now, also for CXR r/o lower resp tract illness such as pna, for tramadol prn which also helps with cough,  to f/u any worsening symptoms or concerns

## 2021-04-11 NOTE — Progress Notes (Signed)
[name] [address] [place] [address] Phone: [phone] Subjective:   Stacy [name], am serving as a scribe for Dr. Antoine Primas.  This visit occurred during the SARS-CoV-2 public health emergency.  Safety protocols were in place, including screening questions prior to the visit, additional usage of staff PPE, and extensive cleaning of exam room while observing appropriate contact time as indicated for disinfecting solutions.   I'm seeing this patient by the request  of:  Burns, Bobette Mo, MD  CC: Neck and back pain follow-up.  [id number]  Stacy [name] is a 30 y.o. female coming in with complaint of back and neck pain. Omt 01/18/2021.  Reviewing patient's chart patient did have a cough and started having right rib pain.  Patient was sent for rib x-rays that were independently visualized by me showing no bony abnormality.  Chest x-rays were also unremarkable.  Patient states that her neck is stiff as usual but her R sided rib pain persists. Pain with deep breathing and when sleeping on R side. Also notes pain when opening heavy doors. Pain is constant and dull but if she moves her pain will be sharp.   Medications patient has been prescribed: None  Taking:         Reviewed prior external information including notes and imaging from previsou exam, outside providers and external EMR if available.   As well as notes that were available from care everywhere and other healthcare systems.  Past medical history, social, surgical and family history all reviewed in electronic medical record.  No pertanent information unless stated regarding to the chief complaint.   Past Medical History:  Diagnosis Date   Anemia    Hyperlipidemia     Allergies  Allergen Reactions   Doxycycline Other (See Comments)    Decreased WBC, Neutropenia     Review of Systems:  No headache, visual changes, nausea, vomiting, diarrhea, constipation, dizziness,  abdominal pain, skin rash, fevers, chills, night sweats, weight loss, swollen lymph nodes,joint swelling, chest pain, shortness of breath, mood changes. POSITIVE muscle aches, muscle aches  Objective  Blood pressure 110/80, pulse (!) 103, height 5\' 4"  (1.626 m), weight 131 lb (59.4 kg), SpO2 97 %.   General: No apparent distress alert and oriented x3 mood and affect normal, dressed appropriately.  HEENT: Pupils equal, extraocular movements intact  Respiratory: Patient's speak in full sentences and does not appear short of breath  Cardiovascular: No lower extremity edema, non tender, no erythema  Hypermobility noted to multiple joints. Neck exam still has some very mild tightness with sidebending bilaterally.  Patient does have pain with full flexion of the neck.  5 out of 5 strength of the upper extremities.  Tightness noted in the parascapular region right greater than left.  Tightness noted in the paraspinal musculature of the lumbar spine right greater than left. Right-sided ribs show old patient does have some mild discomfort noted of the paraspinal musculature.  Patient actually does have some pain in the epigastric area on the right upper quadrant.  Patient has a negative Murphy sign noted.  No pain in the right lower quadrant.  Patient has some mild pain anterior lateral ribs right side more around   Osteopathic findings  C2 flexed rotated and side bent right C6 flexed rotated and side bent left T3 extended rotated and side bent right inhaled rib T9 extended rotated and side bent left L2 flexed rotated and side bent right Sacrum right on right  Assessment and Plan:  Rib pain on right side Continues to have tenderness.  This seems to be nonspecific and difficult to assess on patient's on exam today.  Patient did have a cough and did have travel history and is on birth control.  Will get D-dimer patient does not have any true chest pain orshortness of breath the patient does  have some difficulty with deep inspiration.  Patient is also having pain in the right upper quadrant that is concerning for potential liver.  Patient will has had a abdominal ultrasound previously that was unremarkable for gallbladder.  We discussed the potential for advanced imaging such as a CT scan but we will see what the laboratory work-up comes back as.  Put patient back on a once weekly vitamin D that I think will be beneficial.  Follow-up with me again in 2 to 4 weeks   Nonallopathic problems  Decision today to treat with OMT was based on Physical Exam  After verbal consent patient was treated with HVLA, ME, FPR techniques in cervical, rib, thoracic, lumbar, and sacral  areas  Patient tolerated the procedure well with improvement in symptoms  Patient given exercises, stretches and lifestyle modifications  See medications in patient instructions if given  Patient will follow up in 4-8 weeks      The above documentation has been reviewed and is accurate and complete Judi Saa, DO        Note: This dictation was prepared with Dragon dictation along with smaller phrase technology. Any transcriptional errors that result from this process are unintentional.

## 2021-04-12 ENCOUNTER — Other Ambulatory Visit: Payer: Self-pay

## 2021-04-12 ENCOUNTER — Encounter: Payer: Self-pay | Admitting: Family Medicine

## 2021-04-12 ENCOUNTER — Ambulatory Visit: Payer: No Typology Code available for payment source | Admitting: Family Medicine

## 2021-04-12 VITALS — BP 110/80 | HR 103 | Ht 64.0 in | Wt 131.0 lb

## 2021-04-12 DIAGNOSIS — M9908 Segmental and somatic dysfunction of rib cage: Secondary | ICD-10-CM | POA: Diagnosis not present

## 2021-04-12 DIAGNOSIS — M2550 Pain in unspecified joint: Secondary | ICD-10-CM

## 2021-04-12 DIAGNOSIS — M9903 Segmental and somatic dysfunction of lumbar region: Secondary | ICD-10-CM | POA: Diagnosis not present

## 2021-04-12 DIAGNOSIS — M9901 Segmental and somatic dysfunction of cervical region: Secondary | ICD-10-CM

## 2021-04-12 DIAGNOSIS — M9904 Segmental and somatic dysfunction of sacral region: Secondary | ICD-10-CM

## 2021-04-12 DIAGNOSIS — M9902 Segmental and somatic dysfunction of thoracic region: Secondary | ICD-10-CM

## 2021-04-12 DIAGNOSIS — R0781 Pleurodynia: Secondary | ICD-10-CM | POA: Diagnosis not present

## 2021-04-12 LAB — COMPREHENSIVE METABOLIC PANEL
ALT: 10 U/L (ref 0–35)
AST: 19 U/L (ref 0–37)
Albumin: 4 g/dL (ref 3.5–5.2)
Alkaline Phosphatase: 49 U/L (ref 39–117)
BUN: 11 mg/dL (ref 6–23)
CO2: 32 mEq/L (ref 19–32)
Calcium: 9.6 mg/dL (ref 8.4–10.5)
Chloride: 101 mEq/L (ref 96–112)
Creatinine, Ser: 0.8 mg/dL (ref 0.40–1.20)
GFR: 99.17 mL/min (ref >60.00)
Glucose, Bld: 89 mg/dL (ref 70–99)
Potassium: 3.8 mEq/L (ref 3.5–5.1)
Sodium: 138 mEq/L (ref 135–145)
Total Bilirubin: 0.3 mg/dL (ref 0.2–1.2)
Total Protein: 7.8 g/dL (ref 6.0–8.3)

## 2021-04-12 LAB — SEDIMENTATION RATE: Sed Rate: 17 mm/hr (ref 0–20)

## 2021-04-12 MED ORDER — VITAMIN D (ERGOCALCIFEROL) 1.25 MG (50000 UNIT) PO CAPS
50000.0000 [IU] | ORAL_CAPSULE | ORAL | 0 refills | Status: DC
  Filled 2021-04-12: qty 12, 84d supply, fill #0

## 2021-04-12 NOTE — Patient Instructions (Addendum)
May take some time but we will monitor Vid D at [facility] today Send message in 2 weeks See me again in 4-5 weeks

## 2021-04-12 NOTE — Assessment & Plan Note (Signed)
Continues to have tenderness.  This seems to be nonspecific and difficult to assess on patient's on exam today.  Patient did have a cough and did have travel history and is on birth control.  Will get D-dimer patient does not have any true chest pain orshortness of breath the patient does have some difficulty with deep inspiration.  Patient is also having pain in the right upper quadrant that is concerning for potential liver.  Patient will has had a abdominal ultrasound previously that was unremarkable for gallbladder.  We discussed the potential for advanced imaging such as a CT scan but we will see what the laboratory work-up comes back as.  Put patient back on a once weekly vitamin D that I think will be beneficial.  Follow-up with me again in 2 to 4 weeks

## 2021-04-13 ENCOUNTER — Other Ambulatory Visit: Payer: Self-pay

## 2021-04-13 ENCOUNTER — Ambulatory Visit (INDEPENDENT_AMBULATORY_CARE_PROVIDER_SITE_OTHER)
Admission: RE | Admit: 2021-04-13 | Discharge: 2021-04-13 | Disposition: A | Discharge status: Home / Self Care | Payer: No Typology Code available for payment source | Source: Ambulatory Visit | Attending: Family Medicine | Admitting: Family Medicine

## 2021-04-13 DIAGNOSIS — R079 Chest pain, unspecified: Secondary | ICD-10-CM

## 2021-04-13 LAB — D-DIMER, QUANTITATIVE: D-Dimer, Quant: 1.17 mcg/mL FEU — ABNORMAL HIGH (ref <0.50)

## 2021-04-13 MED ORDER — IOHEXOL 350 MG/ML SOLN
80.0000 mL | Freq: Once | INTRAVENOUS | Status: AC | PRN
  Administered 2021-04-13: 80 mL via INTRAVENOUS

## 2021-04-25 NOTE — Progress Notes (Signed)
Subjective:    Patient ID: Stacy [name], female    DOB: 03-Mar-1991, 30 y.o.   MRN: [id number]   This visit occurred during the SARS-CoV-2 public health emergency.  Safety protocols were in place, including screening questions prior to the visit, additional usage of staff PPE, and extensive cleaning of exam room while observing appropriate contact time as indicated for disinfecting solutions.    HPI She is here for a physical exam.   Still having a little cough - changed allergy meds, taking tums at night.  She is not happy with her weight.  Diet is the same but has not been as active.  She knows she needs to get more active.   Medications and allergies reviewed with patient and updated if appropriate.  Patient Active Problem List   Diagnosis Date Noted   Chest pain 04/09/2021   Rib pain on right side 04/05/2021   Family history of diabetes mellitus (DM) 04/22/2020   Anxiety and depression 07/06/2019   Difficulty sleeping 07/06/2019   RUQ pain 04/16/2019   Slipped rib syndrome 06/19/2018   Nonallopathic lesion of sacral region 06/19/2018   Nonallopathic lesion of rib cage 06/19/2018   Palpitations 02/11/2018   Hypermobility syndrome 03/25/2017   Nonallopathic lesion of thoracic region 03/25/2017   Nonallopathic lesion of cervical region 03/25/2017   Nonallopathic lesion of lumbosacral region 03/25/2017   Back pain 01/30/2017   Dandruff 01/23/2016   Iron deficiency anemia 01/23/2016    Current Outpatient Medications on File Prior to Visit  Medication Sig Dispense Refill   albuterol (PROVENTIL HFA;VENTOLIN HFA) 108 (90 Base) MCG/ACT inhaler Inhale 2 puffs into the lungs every 4 (four) hours as needed for wheezing or shortness of breath (cough, shortness of breath or wheezing.). 1 Inhaler 1   etonogestrel-ethinyl estradiol (NUVARING) 0.12-0.015 MG/24HR vaginal ring INSERT 1 RING VAGINALLY ONCE EVERY 4 WEEKS; LEAVE IN FOR 3 WEEKS AND REMOVE FOR 1 WEEK OR AS DIRECTED 3 each 4    ferrous sulfate 325 (65 FE) MG EC tablet Take 325 mg by mouth daily.      fluticasone (FLONASE) 50 MCG/ACT nasal spray Place 2 sprays into both nostrils daily. 16 g 5   ketoconazole (NIZORAL) 2 % shampoo Apply 1 application topically 2 (two) times a week. 120 mL 5   LORazepam (ATIVAN) 0.5 MG tablet TAKE 1 TABLET BY MOUTH AT BEDTIME AS NEEDED FOR ANXIETY OR SLEEP 30 tablet 0   propranolol (INDERAL) 10 MG tablet TAKE 1 TABLET BY MOUTH DAILY AS NEEDED (PRESENTATIONS). 30 tablet 0   tiZANidine (ZANAFLEX) 4 MG tablet Take 1 tablet (4 mg total) by mouth at bedtime. 30 tablet 0   traMADol (ULTRAM) 50 MG tablet Take 1 tablet (50 mg total) by mouth every 6 (six) hours as needed. 30 tablet 0   Vitamin D, Ergocalciferol, (DRISDOL) 1.25 MG (50000 UNIT) CAPS capsule Take 1 capsule (50,000 Units total) by mouth every 7 (seven) days. 12 capsule 0   [DISCONTINUED] escitalopram (LEXAPRO) 5 MG tablet TAKE 1 TABLET BY MOUTH DAILY 30 tablet 5   No current facility-administered medications on file prior to visit.    Past Medical History:  Diagnosis Date   Anemia    Hyperlipidemia     Past Surgical History:  Procedure Laterality Date   WISDOM TOOTH EXTRACTION      Social History   Socioeconomic History   Marital status: Single    Spouse name: Not on file   Number of children: Not on file  Years of education: Not on file   Highest education level: Not on file  Occupational History   Not on file  Tobacco Use   Smoking status: Never   Smokeless tobacco: Never  Substance and Sexual Activity   Alcohol use: Yes   Drug use: No   Sexual activity: Not on file  Other Topics Concern   Not on file  Social History Narrative   Not on file   Social Determinants of Health   Financial Resource Strain: Not on file  Food Insecurity: Not on file  Transportation Needs: Not on file  Physical Activity: Not on file  Stress: Not on file  Social Connections: Not on file    Family History  Problem  Relation Age of Onset   Diabetes Mother    Autoimmune disease Mother    Breast cancer Mother        76's DCIS   Hyperlipidemia Father    Cancer Maternal Grandmother        breast   Breast cancer Maternal Grandmother    Cancer Paternal Grandmother        breast   Heart disease Paternal Grandmother    Breast cancer Paternal Grandmother     Review of Systems  Constitutional:  Negative for chills and fever.  Eyes:  Negative for visual disturbance.  Respiratory:  Positive for cough (occ - PND/allergy related). Negative for shortness of breath and wheezing.   Cardiovascular:  Negative for chest pain, palpitations and leg swelling.  Gastrointestinal:  Negative for abdominal pain, blood in stool, constipation, diarrhea and nausea.       Occ gerd  Genitourinary:  Negative for dysuria.  Musculoskeletal:  Positive for back pain (upper back) and neck pain. Negative for arthralgias.       Rib pain - msk  Neurological:  Negative for light-headedness and headaches.  Psychiatric/Behavioral:  Positive for dysphoric mood and sleep disturbance. The patient is nervous/anxious.       Objective:   Vitals:   04/26/21 0924  BP: 110/74  Pulse: 91  Temp: 98.3 F (36.8 C)  SpO2: 96%   Filed Weights   04/26/21 0924  Weight: 130 lb 6.4 oz (59.1 kg)   Body mass index is 22.38 kg/m.  BP Readings from Last 3 Encounters:  04/26/21 110/74  04/12/21 110/80  04/05/21 124/76    Wt Readings from Last 3 Encounters:  04/26/21 130 lb 6.4 oz (59.1 kg)  04/12/21 131 lb (59.4 kg)  04/05/21 131 lb (59.4 kg)     Physical Exam Constitutional: She appears well-developed and well-nourished. No distress.  HENT:  Head: Normocephalic and atraumatic.  Right Ear: External ear normal. Normal ear canal and TM Left Ear: External ear normal.  Normal ear canal and TM Mouth/Throat: Oropharynx is clear and moist.  Eyes: Conjunctivae and EOM are normal.  Neck: Neck supple. No tracheal deviation present. No  thyromegaly present.  No carotid bruit  Cardiovascular: Normal rate, regular rhythm and normal heart sounds.   No murmur heard.  No edema. Pulmonary/Chest: Effort normal and breath sounds normal. No respiratory distress. She has no wheezes. She has no rales.  Breast: deferred   Abdominal: Soft. She exhibits no distension. There is no tenderness.  Lymphadenopathy: She has no cervical adenopathy.  Skin: Skin is warm and dry. She is not diaphoretic.  Psychiatric: She has a normal mood and affect. Her behavior is normal.        Assessment & Plan:   Physical exam: Screening blood  work  ordered Exercise  none - will restart regular exercise Diet   earting pretty healthy Weight  normal Substance abuse   none    Had tdap at work    Health Maintenance  Topic Date Due   INFLUENZA VACCINE  05/15/2021   PAP SMEAR-Modifier  02/22/2022   TETANUS/TDAP  04/06/2031   COVID-19 Vaccine  Completed   Hepatitis C Screening  Completed   HIV Screening  Completed   HPV VACCINES  Aged Out   Pneumococcal Vaccine 73-109 Years old  Discontinued          See Problem List for Assessment and Plan of chronic medical problems.

## 2021-04-25 NOTE — Patient Instructions (Addendum)
  Blood work was ordered.     Medications changes include :   increase lexparo to 10 mg daily  Your prescription(s) have been submitted to your pharmacy. Please take as directed and contact our office if you believe you are having problem(s) with the medication(s).    Please followup in 6 months

## 2021-04-26 ENCOUNTER — Ambulatory Visit (INDEPENDENT_AMBULATORY_CARE_PROVIDER_SITE_OTHER): Payer: No Typology Code available for payment source | Admitting: Internal Medicine

## 2021-04-26 ENCOUNTER — Other Ambulatory Visit: Payer: Self-pay

## 2021-04-26 ENCOUNTER — Encounter: Payer: Self-pay | Admitting: Internal Medicine

## 2021-04-26 VITALS — BP 110/74 | HR 91 | Temp 98.3°F | Ht 64.0 in | Wt 130.4 lb

## 2021-04-26 DIAGNOSIS — G479 Sleep disorder, unspecified: Secondary | ICD-10-CM | POA: Diagnosis not present

## 2021-04-26 DIAGNOSIS — Z833 Family history of diabetes mellitus: Secondary | ICD-10-CM

## 2021-04-26 DIAGNOSIS — Z0000 Encounter for general adult medical examination without abnormal findings: Secondary | ICD-10-CM

## 2021-04-26 DIAGNOSIS — F419 Anxiety disorder, unspecified: Secondary | ICD-10-CM | POA: Diagnosis not present

## 2021-04-26 DIAGNOSIS — D509 Iron deficiency anemia, unspecified: Secondary | ICD-10-CM

## 2021-04-26 DIAGNOSIS — F32A Depression, unspecified: Secondary | ICD-10-CM

## 2021-04-26 LAB — CBC WITH DIFFERENTIAL/PLATELET
Basophils Absolute: 0.1 10*3/uL (ref 0.0–0.1)
Basophils Relative: 1.1 % (ref 0.0–3.0)
Eosinophils Absolute: 0.1 10*3/uL (ref 0.0–0.7)
Eosinophils Relative: 1.5 % (ref 0.0–5.0)
HCT: 38.1 % (ref 36.0–46.0)
Hemoglobin: 13.1 g/dL (ref 12.0–15.0)
Lymphocytes Relative: 27.4 % (ref 12.0–46.0)
Lymphs Abs: 1.3 10*3/uL (ref 0.7–4.0)
MCHC: 34.3 g/dL (ref 30.0–36.0)
MCV: 86.7 fl (ref 78.0–100.0)
Monocytes Absolute: 0.3 10*3/uL (ref 0.1–1.0)
Monocytes Relative: 6.6 % (ref 3.0–12.0)
Neutro Abs: 3.1 10*3/uL (ref 1.4–7.7)
Neutrophils Relative %: 63.4 % (ref 43.0–77.0)
Platelets: 273 10*3/uL (ref 150.0–400.0)
RBC: 4.4 Mil/uL (ref 3.87–5.11)
RDW: 13.6 % (ref 11.5–15.5)
WBC: 4.9 10*3/uL (ref 4.0–10.5)

## 2021-04-26 LAB — LIPID PANEL
Cholesterol: 233 mg/dL — ABNORMAL HIGH (ref 0–200)
HDL: 89.5 mg/dL (ref >39.00)
LDL Cholesterol: 127 mg/dL — ABNORMAL HIGH (ref 0–99)
NonHDL: 143.74
Total CHOL/HDL Ratio: 3
Triglycerides: 84 mg/dL (ref 0.0–149.0)
VLDL: 16.8 mg/dL (ref 0.0–40.0)

## 2021-04-26 LAB — COMPREHENSIVE METABOLIC PANEL
ALT: 7 U/L (ref 0–35)
AST: 18 U/L (ref 0–37)
Albumin: 4.3 g/dL (ref 3.5–5.2)
Alkaline Phosphatase: 50 U/L (ref 39–117)
BUN: 10 mg/dL (ref 6–23)
CO2: 28 mEq/L (ref 19–32)
Calcium: 9.9 mg/dL (ref 8.4–10.5)
Chloride: 102 mEq/L (ref 96–112)
Creatinine, Ser: 0.82 mg/dL (ref 0.40–1.20)
GFR: 96.25 mL/min (ref >60.00)
Glucose, Bld: 84 mg/dL (ref 70–99)
Potassium: 4.1 mEq/L (ref 3.5–5.1)
Sodium: 136 mEq/L (ref 135–145)
Total Bilirubin: 0.3 mg/dL (ref 0.2–1.2)
Total Protein: 8 g/dL (ref 6.0–8.3)

## 2021-04-26 LAB — TSH: TSH: 1.06 u[IU]/mL (ref 0.35–5.50)

## 2021-04-26 MED ORDER — ESCITALOPRAM OXALATE 10 MG PO TABS
10.0000 mg | ORAL_TABLET | Freq: Every day | ORAL | 1 refills | Status: DC
  Filled 2021-04-26: qty 90, 90d supply, fill #0
  Filled 2021-08-07: qty 90, 90d supply, fill #1

## 2021-04-26 NOTE — Assessment & Plan Note (Signed)
Chronic Sugars have been well controlled-we will check glucose

## 2021-04-26 NOTE — Assessment & Plan Note (Signed)
Chronic Maybe a little anxiety and depression She feels she could use a little more lexparo - will increase to 10 mg daily

## 2021-04-26 NOTE — Assessment & Plan Note (Signed)
Chronic-history of iron deficiency anemia Check CBC, iron panel

## 2021-04-26 NOTE — Assessment & Plan Note (Addendum)
Chronic occ uses lorazepam 0.5 mg at night Trying to go to bed earlier Possibly anxiety is contributing some-we will increase Lexapro to 10 mg daily to see if that helps Restart regular exercise

## 2021-04-27 LAB — IRON,TIBC AND FERRITIN PANEL
%SAT: 27 % (calc) (ref 16–45)
Ferritin: 10 ng/mL — ABNORMAL LOW (ref 16–154)
Iron: 124 ug/dL (ref 40–190)
TIBC: 463 mcg/dL (calc) — ABNORMAL HIGH (ref 250–450)

## 2021-05-02 ENCOUNTER — Ambulatory Visit: Payer: No Typology Code available for payment source | Admitting: Family Medicine

## 2021-05-23 NOTE — Progress Notes (Signed)
  [name] [address] [place] [address] Phone: [phone] Subjective:    I'm seeing this patient by the request  of:  Pincus Sanes, MD  CC: Neck and back pain follow-up  [id number]  Stacy [name] is a 30 y.o. female coming in with complaint of back and neck pain. OMT 04/12/2021.  Patient states Right rib and neck is causing her problems.    Medications patient has been prescribed: Vit D  Taking: vit d         Reviewed prior external information including notes and imaging from previsou exam, outside providers and external EMR if available.   As well as notes that were available from care everywhere and other healthcare systems.  Past medical history, social, surgical and family history all reviewed in electronic medical record.  No pertanent information unless stated regarding to the chief complaint.   Past Medical History:  Diagnosis Date   Anemia    Hyperlipidemia     Allergies  Allergen Reactions   Doxycycline Other (See Comments)    Decreased WBC, Neutropenia     Review of Systems:  No headache, visual changes, nausea, vomiting, diarrhea, constipation, dizziness, abdominal pain, skin rash, fevers, chills, night sweats, weight loss, swollen lymph nodes, body aches, joint swelling, chest pain, shortness of breath, mood changes. POSITIVE muscle aches  Objective  Blood pressure 118/80, pulse 92, height 5\' 4"  (1.626 m), weight 130 lb (59 kg), SpO2 97 %.   General: No apparent distress alert and oriented x3 mood and affect normal, dressed appropriately.  HEENT: Pupils equal, extraocular movements intact  Respiratory: Patient's speak in full sentences and does not appear short of breath  Cardiovascular: No lower extremity edema, non tender, no erythema  Hypermobility noted to multiple joints. Back -patient does have significant tightness noted on the right side of the parascapular region noted.  Patient still has a  hypermobility.  Tightness of the neck noted as well.  Osteopathic findings  C2 flexed rotated and side bent right C6 flexed rotated and side bent left T3 extended rotated and side bent right inhaled rib T7 extended rotated and side bent left L2 flexed rotated and side bent right Sacrum right on right Tightness of the hamstring bilaterally.      Assessment and Plan:  Slipped rib syndrome Continues to have cyclic syndrome with patient actually having more discomfort and pain with certain activities.  Patient's needs to watch her ergonomics when she is doing certain activities.  Has muscle relaxers if needed for breakthrough pain.  Follow-up with me again in 6 to 8 weeks   Nonallopathic problems  Decision today to treat with OMT was based on Physical Exam  After verbal consent patient was treated with HVLA, ME, FPR techniques in cervical, rib, thoracic, lumbar, and sacral  areas  Patient tolerated the procedure well with improvement in symptoms  Patient given exercises, stretches and lifestyle modifications  See medications in patient instructions if given  Patient will follow up in 4-8 weeks      The above documentation has been reviewed and is accurate and complete , DO       Note: This dictation was prepared with Dragon dictation along with smaller phrase technology. Any transcriptional errors that result from this process are unintentional.

## 2021-05-24 ENCOUNTER — Other Ambulatory Visit: Payer: Self-pay

## 2021-05-24 ENCOUNTER — Ambulatory Visit: Payer: No Typology Code available for payment source | Admitting: Family Medicine

## 2021-05-24 ENCOUNTER — Encounter: Payer: Self-pay | Admitting: Family Medicine

## 2021-05-24 VITALS — BP 118/80 | HR 92 | Ht 64.0 in | Wt 130.0 lb

## 2021-05-24 DIAGNOSIS — M9908 Segmental and somatic dysfunction of rib cage: Secondary | ICD-10-CM

## 2021-05-24 DIAGNOSIS — M9904 Segmental and somatic dysfunction of sacral region: Secondary | ICD-10-CM

## 2021-05-24 DIAGNOSIS — M9902 Segmental and somatic dysfunction of thoracic region: Secondary | ICD-10-CM | POA: Diagnosis not present

## 2021-05-24 DIAGNOSIS — M9903 Segmental and somatic dysfunction of lumbar region: Secondary | ICD-10-CM | POA: Diagnosis not present

## 2021-05-24 DIAGNOSIS — M9901 Segmental and somatic dysfunction of cervical region: Secondary | ICD-10-CM

## 2021-05-24 DIAGNOSIS — M940 Chondrocostal junction syndrome [Tietze]: Secondary | ICD-10-CM

## 2021-05-24 NOTE — Assessment & Plan Note (Signed)
Continues to have cyclic syndrome with patient actually having more discomfort and pain with certain activities.  Patient's needs to watch her ergonomics when she is doing certain activities.  Has muscle relaxers if needed for breakthrough pain.  Follow-up with me again in 6 to 8 weeks

## 2021-05-24 NOTE — Patient Instructions (Signed)
Good luck with yardwork and quilt Try to think of better sitting positions when working on quilt See me in 4-6 weeks

## 2021-06-26 ENCOUNTER — Other Ambulatory Visit: Payer: Self-pay

## 2021-07-05 ENCOUNTER — Other Ambulatory Visit: Payer: Self-pay

## 2021-07-05 ENCOUNTER — Ambulatory Visit (INDEPENDENT_AMBULATORY_CARE_PROVIDER_SITE_OTHER): Payer: No Typology Code available for payment source | Admitting: Family Medicine

## 2021-07-05 VITALS — BP 114/72 | HR 101 | Ht 64.0 in | Wt 131.0 lb

## 2021-07-05 DIAGNOSIS — M9903 Segmental and somatic dysfunction of lumbar region: Secondary | ICD-10-CM

## 2021-07-05 DIAGNOSIS — M9904 Segmental and somatic dysfunction of sacral region: Secondary | ICD-10-CM | POA: Diagnosis not present

## 2021-07-05 DIAGNOSIS — M9908 Segmental and somatic dysfunction of rib cage: Secondary | ICD-10-CM | POA: Diagnosis not present

## 2021-07-05 DIAGNOSIS — M9902 Segmental and somatic dysfunction of thoracic region: Secondary | ICD-10-CM | POA: Diagnosis not present

## 2021-07-05 DIAGNOSIS — M357 Hypermobility syndrome: Secondary | ICD-10-CM

## 2021-07-05 DIAGNOSIS — M9901 Segmental and somatic dysfunction of cervical region: Secondary | ICD-10-CM | POA: Diagnosis not present

## 2021-07-05 NOTE — Patient Instructions (Signed)
Great to see you Do prescribed exercises at least 3x a week See you again in 6-8 weeks

## 2021-07-05 NOTE — Progress Notes (Signed)
[name] [address] [place] [address] Phone: [phone] Subjective:   Stacy [name], am serving as a scribe for Dr. Antoine Primas. This visit occurred during the SARS-CoV-2 public health emergency.  Safety protocols were in place, including screening questions prior to the visit, additional usage of staff PPE, and extensive cleaning of exam room while observing appropriate contact time as indicated for disinfecting solutions.   I'm seeing this patient by the request  of:  Pincus Sanes, MD  CC: back and neck pain   [id number]  Stacy [name] is a 30 y.o. female coming in with complaint of back and neck pain. OMT on 05/24/2021. Patient states pain is present and remains the same. Right elbow feels like it has subluxed.  Medications patient has been prescribed: None          Reviewed prior external information including notes and imaging from previsou exam, outside providers and external EMR if available.   As well as notes that were available from care everywhere and other healthcare systems.  Past medical history, social, surgical and family history all reviewed in electronic medical record.  No pertanent information unless stated regarding to the chief complaint.   Past Medical History:  Diagnosis Date   Anemia    Hyperlipidemia     Allergies  Allergen Reactions   Doxycycline Other (See Comments)    Decreased WBC, Neutropenia     Review of Systems:  No headache, visual changes, nausea, vomiting, diarrhea, constipation, dizziness, abdominal pain, skin rash, fevers, chills, night sweats, weight loss, swollen lymph nodes, body aches, joint swelling, chest pain, shortness of breath, mood changes. POSITIVE muscle aches  Objective  Blood pressure 114/72, pulse (!) 101, height 5\' 4"  (1.626 m), weight 131 lb (59.4 kg), SpO2 98 %.   General: No apparent distress alert and oriented x3 mood and affect normal, dressed  appropriately.  HEENT: Pupils equal, extraocular movements intact  Respiratory: Patient's speak in full sentences and does not appear short of breath  Cardiovascular: No lower extremity edema, non tender, no erythema  Hypermobility noted in multiple joints.  Patient does have tightness noted of the elbows bilaterally.  Patient does have mild tightness noted in the parascapular region right greater than left.  Osteopathic findings  C2 flexed rotated and side bent right C6 flexed rotated and side bent left T3 extended rotated and side bent right inhaled rib T9 extended rotated and side bent left L2 flexed rotated and side bent right Sacrum right on right       Assessment and Plan:  Hypermobility syndrome Patient continues to have hypermobility.  Discussed with patient about icing regimen and home exercises, discussed which activities to do which wants to avoid.  Do feel that strengthening exercises will be the most beneficial for this individual.  Still responding well to manipulation.  Follow-up again in 6 weeks   Nonallopathic problems  Decision today to treat with OMT was based on Physical Exam  After verbal consent patient was treated with HVLA, ME, FPR techniques in cervical, rib, thoracic, lumbar, and sacral  areas  Patient tolerated the procedure well with improvement in symptoms  Patient given exercises, stretches and lifestyle modifications  See medications in patient instructions if given  Patient will follow up in 4-8 weeks      The above documentation has been reviewed and is accurate and complete , DO       Note: This dictation was prepared with  Dragon dictation along with smaller Company secretary. Any transcriptional errors that result from this process are unintentional.

## 2021-07-05 NOTE — Assessment & Plan Note (Signed)
Patient continues to have hypermobility.  Discussed with patient about icing regimen and home exercises, discussed which activities to do which wants to avoid.  Do feel that strengthening exercises will be the most beneficial for this individual.  Still responding well to manipulation.  Follow-up again in 6 weeks

## 2021-08-07 ENCOUNTER — Other Ambulatory Visit: Payer: Self-pay

## 2021-08-09 ENCOUNTER — Other Ambulatory Visit: Payer: Self-pay | Admitting: Internal Medicine

## 2021-08-10 ENCOUNTER — Other Ambulatory Visit: Payer: Self-pay

## 2021-08-10 ENCOUNTER — Other Ambulatory Visit: Payer: Self-pay | Admitting: Internal Medicine

## 2021-08-10 MED FILL — Lorazepam Tab 0.5 MG: ORAL | 30 days supply | Qty: 30 | Fill #0 | Status: AC

## 2021-08-11 ENCOUNTER — Other Ambulatory Visit: Payer: Self-pay

## 2021-09-12 NOTE — Progress Notes (Signed)
[name] [address] [place] [address] Phone: [phone] Subjective:   Bruce Donath, am serving as a scribe for Dr. Antoine Primas.  This visit occurred during the SARS-CoV-2 public health emergency.  Safety protocols were in place, including screening questions prior to the visit, additional usage of staff PPE, and extensive cleaning of exam room while observing appropriate contact time as indicated for disinfecting solutions.   I'm seeing this patient by the request  of:  Pincus Sanes, MD  CC: neck and back pain   [id number]  Stacy [name] is a 30 y.o. female coming in with complaint of back and neck pain. OMT on 07/05/2021. Patient states that she has some stiffness after traveling but otherwise has had no new issues since last visit.  Patient is tightness that she has not been on bed.  Exercises as well when she is traveling so much.  Medications patient has been prescribed: None  Taking:         Reviewed prior external information including notes and imaging from previsou exam, outside providers and external EMR if available.   As well as notes that were available from care everywhere and other healthcare systems.  Past medical history, social, surgical and family history all reviewed in electronic medical record.  No pertanent information unless stated regarding to the chief complaint.   Past Medical History:  Diagnosis Date   Anemia    Hyperlipidemia     Allergies  Allergen Reactions   Doxycycline Other (See Comments)    Decreased WBC, Neutropenia     Review of Systems:  No headache, visual changes, nausea, vomiting, diarrhea, constipation, dizziness, abdominal pain, skin rash, fevers, chills, night sweats, weight loss, swollen lymph nodes, body aches, joint swelling, chest pain, shortness of breath, mood changes. POSITIVE muscle aches  Objective  Blood pressure 122/72, pulse 80, height 5\' 4"  (1.626 m), weight  131 lb (59.4 kg), SpO2 98 %.   General: No apparent distress alert and oriented x3 mood and affect normal, dressed appropriately.  HEENT: Pupils equal, extraocular movements intact  Respiratory: Patient's speak in full sentences and does not appear short of breath  Cardiovascular: No lower extremity edema, non tender, no erythema  Hypermobility noted.  He still has some discomfort in the paraspinal musculature.  Patient does have tightness noted  Osteopathic findings  C2 flexed rotated and side bent right C6 flexed rotated and side bent left T3 extended rotated and side bent right inhaled rib T9 extended rotated and side bent left L2 flexed rotated and side bent right Sacrum right on right       Assessment and Plan:  Slipped rib syndrome Continues rib syndrome.  Discussed with patient about icing regimen.  Hypermobility can give more difficulty as well.  Continues to have more of the upper ribs on the right side but Has more difficulty as well.  Follow-up again in 6 to 8 weeks.   Nonallopathic problems  Decision today to treat with OMT was based on Physical Exam  After verbal consent patient was treated with HVLA, ME, FPR techniques in cervical, rib, thoracic, lumbar, and sacral  areas  Patient tolerated the procedure well with improvement in symptoms  Patient given exercises, stretches and lifestyle modifications  See medications in patient instructions if given  Patient will follow up in 4-8 weeks     The above documentation has been reviewed and is accurate and complete , DO  Note: This dictation was prepared with Dragon dictation along with smaller phrase technology. Any transcriptional errors that result from this process are unintentional.

## 2021-09-13 ENCOUNTER — Encounter: Payer: Self-pay | Admitting: Family Medicine

## 2021-09-13 ENCOUNTER — Other Ambulatory Visit: Payer: Self-pay

## 2021-09-13 ENCOUNTER — Ambulatory Visit (INDEPENDENT_AMBULATORY_CARE_PROVIDER_SITE_OTHER): Payer: No Typology Code available for payment source | Admitting: Family Medicine

## 2021-09-13 VITALS — BP 122/72 | HR 80 | Ht 64.0 in | Wt 131.0 lb

## 2021-09-13 DIAGNOSIS — M9902 Segmental and somatic dysfunction of thoracic region: Secondary | ICD-10-CM | POA: Diagnosis not present

## 2021-09-13 DIAGNOSIS — M9908 Segmental and somatic dysfunction of rib cage: Secondary | ICD-10-CM | POA: Diagnosis not present

## 2021-09-13 DIAGNOSIS — M9903 Segmental and somatic dysfunction of lumbar region: Secondary | ICD-10-CM | POA: Diagnosis not present

## 2021-09-13 DIAGNOSIS — M9901 Segmental and somatic dysfunction of cervical region: Secondary | ICD-10-CM

## 2021-09-13 DIAGNOSIS — M9904 Segmental and somatic dysfunction of sacral region: Secondary | ICD-10-CM | POA: Diagnosis not present

## 2021-09-13 DIAGNOSIS — M940 Chondrocostal junction syndrome [Tietze]: Secondary | ICD-10-CM | POA: Diagnosis not present

## 2021-09-13 NOTE — Assessment & Plan Note (Signed)
Continues rib syndrome.  Discussed with patient about icing regimen.  Hypermobility can give more difficulty as well.  Continues to have more of the upper ribs on the right side but Has more difficulty as well.  Follow-up again in 6 to 8 weeks.

## 2021-09-13 NOTE — Patient Instructions (Signed)
Good to see you You know the drill See me after [facility]

## 2021-09-18 ENCOUNTER — Other Ambulatory Visit: Payer: Self-pay

## 2021-10-09 ENCOUNTER — Telehealth: Payer: No Typology Code available for payment source | Admitting: Nurse Practitioner

## 2021-10-09 DIAGNOSIS — U071 COVID-19: Secondary | ICD-10-CM

## 2021-10-09 MED ORDER — NIRMATRELVIR/RITONAVIR (PAXLOVID)TABLET: 3.0000 | ORAL_TABLET | Freq: Two times a day (BID) | ORAL | 0 refills | Status: AC

## 2021-10-09 MED ORDER — BENZONATATE 100 MG PO CAPS: 100.0000 mg | ORAL_CAPSULE | Freq: Three times a day (TID) | ORAL | 0 refills | Status: DC | PRN

## 2021-10-09 MED ORDER — ALBUTEROL SULFATE HFA 108 (90 BASE) MCG/ACT IN AERS: 2.0000 | INHALATION_SPRAY | Freq: Four times a day (QID) | RESPIRATORY_TRACT | 0 refills | Status: DC | PRN

## 2021-10-09 NOTE — Progress Notes (Signed)
Virtual Visit Consent   Stacy [name], you are scheduled for a virtual visit with a [facility] provider today.     Just as with appointments in the office, your consent must be obtained to participate.  Your consent will be active for this visit and any virtual visit you may have with one of our providers in the next 365 days.     If you have a MyChart account, a copy of this consent can be sent to you electronically.  All virtual visits are billed to your insurance company just like a traditional visit in the office.    As this is a virtual visit, video technology does not allow for your provider to perform a traditional examination.  This may limit your provider's ability to fully assess your condition.  If your provider identifies any concerns that need to be evaluated in person or the need to arrange testing (such as labs, EKG, etc.), we will make arrangements to do so.     Although advances in technology are sophisticated, we cannot ensure that it will always work on either your end or our end.  If the connection with a video visit is poor, the visit may have to be switched to a telephone visit.  With either a video or telephone visit, we are not always able to ensure that we have a secure connection.     I need to obtain your verbal consent now.   Are you willing to proceed with your visit today?    Stacy [name] has provided verbal consent on 10/09/2021 for a virtual visit (video or telephone).   Viviano Simas, FNP   Date: 10/09/2021 4:20 PM   Virtual Visit via Video Note   I, Viviano Simas, connected with  Stacy [name]  ([id number], Dec 16, 1990) on 10/09/21 at  4:30 PM EST by a video-enabled telemedicine application and verified that I am speaking with the correct person using two identifiers.  Location: Patient: Virtual Visit Location Patient: Home Provider: Virtual Visit Location Provider: Home Office   I discussed the limitations of evaluation and management by telemedicine  and the availability of in person appointments. The patient expressed understanding and agreed to proceed.    History of Present Illness: Stacy [name] is a 30 y.o. who identifies as a female who was assigned female at birth, and is being seen today after testing positive for COVID at home today.   Her symptom onset was 3 days ago.  Her symptoms include a sore throat that has improved, some nasal congestion that onset last night with a cough. She has had a fever as well.   Her parents also have COVID and she was exposed to them last week.   She has been taking tylenol for pain and fever relief.   She has not had COVID in the past.  She has been vaccinated for COVID x4 not including the most recent booster.   She denies a history of asthma She does have a history of needing inhalers with URIs in the past.  She has also needed prednisone for a cough in the past as well.   Problems:  Patient Active Problem List   Diagnosis Date Noted   Chest pain 04/09/2021   Rib pain on right side 04/05/2021   Family history of diabetes mellitus (DM) 04/22/2020   Anxiety and depression 07/06/2019   Difficulty sleeping 07/06/2019   RUQ pain 04/16/2019   Slipped rib syndrome 06/19/2018   Nonallopathic lesion of sacral region 06/19/2018  Nonallopathic lesion of rib cage 06/19/2018   Palpitations 02/11/2018   Hypermobility syndrome 03/25/2017   Nonallopathic lesion of thoracic region 03/25/2017   Nonallopathic lesion of cervical region 03/25/2017   Nonallopathic lesion of lumbosacral region 03/25/2017   Back pain 01/30/2017   Dandruff 01/23/2016   Iron deficiency anemia 01/23/2016    Allergies:  Allergies  Allergen Reactions   Doxycycline Other (See Comments)    Decreased WBC, Neutropenia   Current Outpatient Medications  Medication Instructions   albuterol (PROVENTIL HFA;VENTOLIN HFA) 108 (90 Base) MCG/ACT inhaler 2 puffs, Inhalation, Every 4 hours PRN   escitalopram (LEXAPRO) 10 mg,  Oral, Daily   etonogestrel-ethinyl estradiol (NUVARING) 0.12-0.015 MG/24HR vaginal ring INSERT 1 RING VAGINALLY ONCE EVERY 4 WEEKS; LEAVE IN FOR 3 WEEKS AND REMOVE FOR 1 WEEK OR AS DIRECTED   ferrous sulfate 325 mg, Oral, Daily   fluticasone (FLONASE) 50 MCG/ACT nasal spray 2 sprays, Each Nare, Daily   ketoconazole (NIZORAL) 2 % shampoo 1 application, Topical, 2 times weekly   LORazepam (ATIVAN) 0.5 MG tablet TAKE 1 TABLET BY MOUTH AT BEDTIME AS NEEDED FOR ANXIETY OR SLEEP   propranolol (INDERAL) 10 MG tablet TAKE 1 TABLET BY MOUTH DAILY AS NEEDED (PRESENTATIONS).   tiZANidine (ZANAFLEX) 4 mg, Oral, Daily at bedtime     Observations/Objective: Patient is well-developed, well-nourished in no acute distress.  Resting comfortably  at home.  Head is normocephalic, atraumatic.  No labored breathing.  Speech is clear and coherent with logical content.  Patient is alert and oriented at baseline.    Assessment and Plan: 1. COVID-19  - benzonatate (TESSALON) 100 MG capsule; Take 1 capsule (100 mg total) by mouth 3 (three) times daily as needed for up to 10 days for cough.  Dispense: 30 capsule; Refill: 0 - albuterol (VENTOLIN HFA) 108 (90 Base) MCG/ACT inhaler; Inhale 2 puffs into the lungs every 6 (six) hours as needed for wheezing or shortness of breath.  Dispense: 8 g; Refill: 0 - nirmatrelvir/ritonavir EUA (PAXLOVID) 20 x 150 MG & 10 x 100MG  TABS; Take 3 tablets by mouth 2 (two) times daily for 5 days. (Take nirmatrelvir 150 mg two tablets twice daily for 5 days and ritonavir 100 mg one tablet twice daily for 5 days) Patient GFR is 96  Dispense: 30 tablet; Refill: 0     Follow Up Instructions: I discussed the assessment and treatment plan with the patient. The patient was provided an opportunity to ask questions and all were answered. The patient agreed with the plan and demonstrated an understanding of the instructions.  A copy of instructions were sent to the patient via MyChart unless  otherwise noted below.     The patient was advised to call back or seek an in-person evaluation if the symptoms worsen or if the condition fails to improve as anticipated.  Time:  I spent 15 minutes with the patient via telehealth technology discussing the above problems/concerns.    , FNP

## 2021-10-12 ENCOUNTER — Encounter: Payer: Self-pay | Admitting: Nurse Practitioner

## 2021-10-13 ENCOUNTER — Telehealth: Payer: No Typology Code available for payment source | Admitting: Physician Assistant

## 2021-10-13 ENCOUNTER — Other Ambulatory Visit (HOSPITAL_BASED_OUTPATIENT_CLINIC_OR_DEPARTMENT_OTHER): Payer: Self-pay

## 2021-10-13 DIAGNOSIS — R051 Acute cough: Secondary | ICD-10-CM | POA: Diagnosis not present

## 2021-10-13 MED ORDER — PREDNISONE 20 MG PO TABS
40.0000 mg | ORAL_TABLET | Freq: Every day | ORAL | 0 refills | Status: DC
  Filled 2021-10-13: qty 10, 5d supply, fill #0

## 2021-10-13 NOTE — Patient Instructions (Signed)
Stacy [name], thank you for joining [name], PA-C for today's virtual visit.  While this provider is not your primary care provider (PCP), if your PCP is located in our provider database this encounter information will be shared with them immediately following your visit.  Consent: (Patient) Stacy [name] provided verbal consent for this virtual visit at the beginning of the encounter.  Current Medications:  Current Outpatient Medications:    albuterol (PROVENTIL HFA;VENTOLIN HFA) 108 (90 Base) MCG/ACT inhaler, Inhale 2 puffs into the lungs every 4 (four) hours as needed for wheezing or shortness of breath (cough, shortness of breath or wheezing.)., Disp: 1 Inhaler, Rfl: 1   albuterol (VENTOLIN HFA) 108 (90 Base) MCG/ACT inhaler, Inhale 2 puffs into the lungs every 6 (six) hours as needed for wheezing or shortness of breath., Disp: 8 g, Rfl: 0   benzonatate (TESSALON) 100 MG capsule, Take 1 capsule (100 mg total) by mouth 3 (three) times daily as needed for up to 10 days for cough., Disp: 30 capsule, Rfl: 0   escitalopram (LEXAPRO) 10 MG tablet, Take 1 tablet (10 mg total) by mouth daily., Disp: 90 tablet, Rfl: 1   etonogestrel-ethinyl estradiol (NUVARING) 0.12-0.015 MG/24HR vaginal ring, INSERT 1 RING VAGINALLY ONCE EVERY 4 WEEKS; LEAVE IN FOR 3 WEEKS AND REMOVE FOR 1 WEEK OR AS DIRECTED, Disp: 3 each, Rfl: 4   ferrous sulfate 325 (65 FE) MG EC tablet, Take 325 mg by mouth daily. , Disp: , Rfl:    fluticasone (FLONASE) 50 MCG/ACT nasal spray, Place 2 sprays into both nostrils daily., Disp: 16 g, Rfl: 5   ketoconazole (NIZORAL) 2 % shampoo, Apply 1 application topically 2 (two) times a week., Disp: 120 mL, Rfl: 5   LORazepam (ATIVAN) 0.5 MG tablet, TAKE 1 TABLET BY MOUTH AT BEDTIME AS NEEDED FOR ANXIETY OR SLEEP, Disp: 30 tablet, Rfl: 0   nirmatrelvir/ritonavir EUA (PAXLOVID) 20 x 150 MG & 10 x 100MG  TABS, Take 3 tablets by mouth 2 (two) times daily for 5 days. (Take nirmatrelvir  150 mg two tablets twice daily for 5 days and ritonavir 100 mg one tablet twice daily for 5 days) Patient GFR is 96, Disp: 30 tablet, Rfl: 0   propranolol (INDERAL) 10 MG tablet, TAKE 1 TABLET BY MOUTH DAILY AS NEEDED (PRESENTATIONS)., Disp: 30 tablet, Rfl: 0   tiZANidine (ZANAFLEX) 4 MG tablet, Take 1 tablet (4 mg total) by mouth at bedtime., Disp: 30 tablet, Rfl: 0   Medications ordered in this encounter:  No orders of the defined types were placed in this encounter.    *If you need refills on other medications prior to your next appointment, please contact your pharmacy*  Follow-Up: Call back or seek an in-person evaluation if the symptoms worsen or if the condition fails to improve as anticipated.  Other Instructions Please keep well-hydrated and get plenty of rest. Continue your Tessalon as needed for cough. Start the steroid taking as directed. You can still use the albuterol but no more than as directed.  Symptoms should continue to improve until fully resolved.    If you have been instructed to have an in-person evaluation today at a local Urgent Care facility, please use the link below. It will take you to a list of all of our available [facility], including address, phone number and hours of operation. Please do not delay care.  [facility]  If you or a family member do not have a primary care provider,  use the link below to schedule a visit and establish care. When you choose a [facility] primary care physician or advanced practice provider, you gain a long-term partner in health. Find a Primary Care Provider  Learn more about [facility]'s in-office and virtual care options: [facility] - Get Care Now

## 2021-10-13 NOTE — Progress Notes (Signed)
Virtual Visit Consent   Stacy [name], you are scheduled for a virtual visit with a [facility] provider today.     Just as with appointments in the office, your consent must be obtained to participate.  Your consent will be active for this visit and any virtual visit you may have with one of our providers in the next 365 days.     If you have a MyChart account, a copy of this consent can be sent to you electronically.  All virtual visits are billed to your insurance company just like a traditional visit in the office.    As this is a virtual visit, video technology does not allow for your provider to perform a traditional examination.  This may limit your provider's ability to fully assess your condition.  If your provider identifies any concerns that need to be evaluated in person or the need to arrange testing (such as labs, EKG, etc.), we will make arrangements to do so.     Although advances in technology are sophisticated, we cannot ensure that it will always work on either your end or our end.  If the connection with a video visit is poor, the visit may have to be switched to a telephone visit.  With either a video or telephone visit, we are not always able to ensure that we have a secure connection.     I need to obtain your verbal consent now.   Are you willing to proceed with your visit today?    Stacy [name] has provided verbal consent on 10/13/2021 for a virtual visit (video or telephone).   [name], [place]   Date: 10/13/2021 10:59 AM   Virtual Visit via Video Note   I, [name], connected with  Stacy [name]  ([id number], [birth year]) on 10/13/21 at 10:45 AM EST by a video-enabled telemedicine application and verified that I am speaking with the correct person using two identifiers.  Location: Patient: Virtual Visit Location Patient: Home Provider: Virtual Visit Location Provider: Home Office   I discussed the limitations of evaluation and management  by telemedicine and the availability of in person appointments. The patient expressed understanding and agreed to proceed.    History of Present Illness: Stacy [name] is a 30 y.o. who identifies as a female who was assigned female at birth, and is being seen today for continue cough after recent diagnosis of COVID-19. Notes taking Paxlovid as directed with one dose left. Notes marked improvement in symptoms with resolution of fever, aches, sore throat. Is left with a dry cough that is episodic but frequent and causing a couple of episodes of post-tussive emesis. The Tessalon and albuterol help somewhat but still having substantial cough. Notes history of this when getting URI, sometimes requiring a short-course of steroid for bronchospasm.  HPI: HPI  Problems:  Patient Active Problem List   Diagnosis Date Noted   Chest pain 04/09/2021   Rib pain on right side 04/05/2021   Family history of diabetes mellitus (DM) 04/22/2020   Anxiety and depression 07/06/2019   Difficulty sleeping 07/06/2019   RUQ pain 04/16/2019   Slipped rib syndrome 06/19/2018   Nonallopathic lesion of sacral region 06/19/2018   Nonallopathic lesion of rib cage 06/19/2018   Palpitations 02/11/2018   Hypermobility syndrome 03/25/2017   Nonallopathic lesion of thoracic region 03/25/2017   Nonallopathic lesion of cervical region 03/25/2017   Nonallopathic lesion of lumbosacral region 03/25/2017   Back pain 01/30/2017   Dandruff 01/23/2016  Iron deficiency anemia 01/23/2016    Allergies:  Allergies  Allergen Reactions   Doxycycline Other (See Comments)    Decreased WBC, Neutropenia   Medications:  Current Outpatient Medications:    predniSONE (DELTASONE) 20 MG tablet, Take 2 tablets (40 mg total) by mouth daily with breakfast., Disp: 10 tablet, Rfl: 0   albuterol (PROVENTIL HFA;VENTOLIN HFA) 108 (90 Base) MCG/ACT inhaler, Inhale 2 puffs into the lungs every 4 (four) hours as needed for wheezing or shortness of  breath (cough, shortness of breath or wheezing.)., Disp: 1 Inhaler, Rfl: 1   albuterol (VENTOLIN HFA) 108 (90 Base) MCG/ACT inhaler, Inhale 2 puffs into the lungs every 6 (six) hours as needed for wheezing or shortness of breath., Disp: 8 g, Rfl: 0   escitalopram (LEXAPRO) 10 MG tablet, Take 1 tablet (10 mg total) by mouth daily., Disp: 90 tablet, Rfl: 1   etonogestrel-ethinyl estradiol (NUVARING) 0.12-0.015 MG/24HR vaginal ring, INSERT 1 RING VAGINALLY ONCE EVERY 4 WEEKS; LEAVE IN FOR 3 WEEKS AND REMOVE FOR 1 WEEK OR AS DIRECTED, Disp: 3 each, Rfl: 4   ferrous sulfate 325 (65 FE) MG EC tablet, Take 325 mg by mouth daily. , Disp: , Rfl:    fluticasone (FLONASE) 50 MCG/ACT nasal spray, Place 2 sprays into both nostrils daily., Disp: 16 g, Rfl: 5   ketoconazole (NIZORAL) 2 % shampoo, Apply 1 application topically 2 (two) times a week., Disp: 120 mL, Rfl: 5   LORazepam (ATIVAN) 0.5 MG tablet, TAKE 1 TABLET BY MOUTH AT BEDTIME AS NEEDED FOR ANXIETY OR SLEEP, Disp: 30 tablet, Rfl: 0   nirmatrelvir/ritonavir EUA (PAXLOVID) 20 x 150 MG & 10 x 100MG  TABS, Take 3 tablets by mouth 2 (two) times daily for 5 days. (Take nirmatrelvir 150 mg two tablets twice daily for 5 days and ritonavir 100 mg one tablet twice daily for 5 days) Patient GFR is 96, Disp: 30 tablet, Rfl: 0   propranolol (INDERAL) 10 MG tablet, TAKE 1 TABLET BY MOUTH DAILY AS NEEDED (PRESENTATIONS)., Disp: 30 tablet, Rfl: 0   tiZANidine (ZANAFLEX) 4 MG tablet, Take 1 tablet (4 mg total) by mouth at bedtime., Disp: 30 tablet, Rfl: 0  Observations/Objective: Patient is well-developed, well-nourished in no acute distress.  Resting comfortably at home.  Head is normocephalic, atraumatic.  No labored breathing. Speech is clear and coherent with logical content.  Patient is alert and oriented at baseline.   Assessment and Plan: 1. Acute cough - predniSONE (DELTASONE) 20 MG tablet; Take 2 tablets (40 mg total) by mouth daily with breakfast.   Dispense: 10 tablet; Refill: 0  Persistent cough with bronchospasm from recent COVID-19. Last dose of antiviral tomorrow and doing markedly better, just with substantial dry cough. Will have her continue current measures and add-on a short burst of prednisone to further calm things down.   Follow Up Instructions: I discussed the assessment and treatment plan with the patient. The patient was provided an opportunity to ask questions and all were answered. The patient agreed with the plan and demonstrated an understanding of the instructions.  A copy of instructions were sent to the patient via MyChart unless otherwise noted below.   The patient was advised to call back or seek an in-person evaluation if the symptoms worsen or if the condition fails to improve as anticipated.  Time:  I spent 10 minutes with the patient via telehealth technology discussing the above problems/concerns.    , PA-C

## 2021-10-27 ENCOUNTER — Ambulatory Visit: Payer: No Typology Code available for payment source | Admitting: Internal Medicine

## 2021-10-31 NOTE — Progress Notes (Signed)
[name] [address] [place] [address] Phone: [phone] Subjective:   Bruce Donath, am serving as a scribe for Dr. Antoine Primas. This visit occurred during the SARS-CoV-2 public health emergency.  Safety protocols were in place, including screening questions prior to the visit, additional usage of staff PPE, and extensive cleaning of exam room while observing appropriate contact time as indicated for disinfecting solutions.  I'm seeing this patient by the request  of:  Pincus Sanes, MD  CC: neck and back pain   [id number]  Stacy [name] is a 31 y.o. female coming in with complaint of back and neck pain. OMT on 09/13/2021. Patient states that she has some L glute pain that feels like a twitch and had been going down on for 5 days. Also notes twitching in her forehead. Neck pain continues as well. Used mm relaxer for neck pain recently.   Medications patient has been prescribed: None  Taking:       Reviewed prior external information including notes and imaging from previsou exam, outside providers and external EMR if available.   As well as notes that were available from care everywhere and other healthcare systems.  Past medical history, social, surgical and family history all reviewed in electronic medical record.  No pertanent information unless stated regarding to the chief complaint.   Past Medical History:  Diagnosis Date   Anemia    Hyperlipidemia     Allergies  Allergen Reactions   Doxycycline Other (See Comments)    Decreased WBC, Neutropenia     Review of Systems:  No headache, visual changes, nausea, vomiting, diarrhea, constipation, dizziness, abdominal pain, skin rash, fevers, chills, night sweats, weight loss, swollen lymph nodes, body aches, joint swelling, chest pain, shortness of breath, mood changes. POSITIVE muscle aches  Objective  Blood pressure 120/86, pulse (!) 102, height 5\' 4"  (1.626 m), weight  131 lb (59.4 kg), SpO2 97 %.   General: No apparent distress alert and oriented x3 mood and affect normal, dressed appropriately.  HEENT: Pupils equal, extraocular movements intact  Respiratory: Patient's speak in full sentences and does not appear short of breath  Cardiovascular: No lower extremity edema, non tender, no erythema  Hypermobility still noted.  More tightness actually noted on the left parascapular region today than the right.  Patient does have some mild lower increasing discomfort as well more of the L4 on the right side..  Negative straight leg test.  5 out of 5 strength of the lower extremities.   Osteopathic findings C7 flexed rotated and side bent left T9 extended rotated and side bent left inhaled rib L4 flexed rotated and side bent right L5 flexed rotated and side bent Sacrum right on right       Assessment and Plan:  Slipped rib syndrome Slipped rib syndrome with exacerbation.  Seems to be the more on the left than the right today.  Does have Zanaflex if necessary.  Discussed icing regimen and home exercises.  Gust which activities to do which wants to avoid.  Increase activity slowly.  Discussed icing regimen.  Follow-up again in 6 to 8 weeks.    Nonallopathic problems  Decision today to treat with OMT was based on Physical Exam  After verbal consent patient was treated with HVLA, ME, FPR techniques in cervical, rib, thoracic, lumbar, and sacral  areas  Patient tolerated the procedure well with improvement in symptoms  Patient given exercises, stretches and lifestyle modifications  See  medications in patient instructions if given  Patient will follow up in 4-8 weeks     The above documentation has been reviewed and is accurate and complete Judi Saa, DO        Note: This dictation was prepared with Dragon dictation along with smaller phrase technology. Any transcriptional errors that result from this process are unintentional.

## 2021-10-31 NOTE — Patient Instructions (Addendum)
° ° ° °  Medications changes include :   none   Your prescription(s) have been submitted to your pharmacy. Please take as directed and contact our office if you believe you are having problem(s) with the medication(s).   Allergy referral ordered    Please followup in 6 months

## 2021-10-31 NOTE — Progress Notes (Addendum)
Subjective:    Patient ID: Stacy [name], female    DOB: September 20, 1991, 31 y.o.   MRN: [id number]  This visit occurred during the SARS-CoV-2 public health emergency.  Safety protocols were in place, including screening questions prior to the visit, additional usage of staff PPE, and extensive cleaning of exam room while observing appropriate contact time as indicated for disinfecting solutions.     HPI The patient is here for follow up of their chronic medical problems, including anxiety, depression, sleep difficulty  She is taking her medication daily as prescribed.  She does feel that her anxiety is better since increasing the Lexapro dose.  She does still have intermittent sleep issues and will take the lorazepam for a few nights and reset her sleep cycle and that works.  She is not taking it on a nightly basis.  She has noticed that her allergies have worsened over the years.  She is needed to change her antihistamine, which sometimes helps.  She also has noted that after her last 2 upper respiratory infection she has had persistent coughing afterwards that has required steroids.  She uses the albuterol as needed.  She feels like this is gotten worse.  Medications and allergies reviewed with patient and updated if appropriate.  Patient Active Problem List   Diagnosis Date Noted   Allergic rhinitis 11/01/2021   Chest pain 04/09/2021   Rib pain on right side 04/05/2021   Family history of diabetes mellitus (DM) 04/22/2020   Anxiety and depression 07/06/2019   Difficulty sleeping 07/06/2019   RUQ pain 04/16/2019   Slipped rib syndrome 06/19/2018   Nonallopathic lesion of sacral region 06/19/2018   Nonallopathic lesion of rib cage 06/19/2018   Palpitations 02/11/2018   Hypermobility syndrome 03/25/2017   Nonallopathic lesion of thoracic region 03/25/2017   Nonallopathic lesion of cervical region 03/25/2017   Nonallopathic lesion of lumbosacral region 03/25/2017   Back pain  01/30/2017   Dandruff 01/23/2016   Iron deficiency anemia 01/23/2016    Current Outpatient Medications on File Prior to Visit  Medication Sig Dispense Refill   albuterol (VENTOLIN HFA) 108 (90 Base) MCG/ACT inhaler Inhale 2 puffs into the lungs every 6 (six) hours as needed for wheezing or shortness of breath. 8 g 0   etonogestrel-ethinyl estradiol (NUVARING) 0.12-0.015 MG/24HR vaginal ring INSERT 1 RING VAGINALLY ONCE EVERY 4 WEEKS; LEAVE IN FOR 3 WEEKS AND REMOVE FOR 1 WEEK OR AS DIRECTED 3 each 4   ferrous sulfate 325 (65 FE) MG EC tablet Take 325 mg by mouth daily.      fluticasone (FLONASE) 50 MCG/ACT nasal spray Place 2 sprays into both nostrils daily. 16 g 5   ketoconazole (NIZORAL) 2 % shampoo Apply 1 application topically 2 (two) times a week. 120 mL 5   propranolol (INDERAL) 10 MG tablet TAKE 1 TABLET BY MOUTH DAILY AS NEEDED (PRESENTATIONS). 30 tablet 0   tiZANidine (ZANAFLEX) 4 MG tablet Take 1 tablet (4 mg total) by mouth at bedtime. 30 tablet 0   No current facility-administered medications on file prior to visit.    Past Medical History:  Diagnosis Date   Anemia    Hyperlipidemia     Past Surgical History:  Procedure Laterality Date   WISDOM TOOTH EXTRACTION      Social History   Socioeconomic History   Marital status: Single    Spouse name: Not on file   Number of children: Not on file   Years of education: Not  on file   Highest education level: Not on file  Occupational History   Not on file  Tobacco Use   Smoking status: Never   Smokeless tobacco: Never  Substance and Sexual Activity   Alcohol use: Yes   Drug use: No   Sexual activity: Not on file  Other Topics Concern   Not on file  Social History Narrative   Not on file   Social Determinants of Health   Financial Resource Strain: Not on file  Food Insecurity: Not on file  Transportation Needs: Not on file  Physical Activity: Not on file  Stress: Not on file  Social Connections: Not on file     Family History  Problem Relation Age of Onset   Diabetes Mother    Autoimmune disease Mother    Breast cancer Mother        43's DCIS   Hyperlipidemia Father    Cancer Maternal Grandmother        breast   Breast cancer Maternal Grandmother    Cancer Paternal Grandmother        breast   Heart disease Paternal Grandmother    Breast cancer Paternal Grandmother     Review of Systems  Constitutional:  Negative for fever.  Respiratory:  Negative for shortness of breath.   Cardiovascular:  Negative for chest pain and palpitations.  Skin:        Concerned about changing mole  Neurological:  Negative for light-headedness and headaches.  Psychiatric/Behavioral:  Positive for dysphoric mood and sleep disturbance. The patient is nervous/anxious.       Objective:   Vitals:   11/01/21 1509  BP: 114/80  Pulse: 97  Temp: 98.6 F (37 C)  SpO2: 96%   BP Readings from Last 3 Encounters:  11/01/21 120/86  11/01/21 114/80  09/13/21 122/72   Wt Readings from Last 3 Encounters:  11/01/21 131 lb (59.4 kg)  11/01/21 131 lb (59.4 kg)  09/13/21 131 lb (59.4 kg)   Body mass index is 22.49 kg/m.   Depression screen [facility] 2/9 11/01/2021 04/28/2020 04/16/2019 02/11/2018 12/02/2015  Decreased Interest 0 1 0 0 0  Down, Depressed, Hopeless 0 2 0 0 0  PHQ - 2 Score 0 3 0 0 0  Altered sleeping 2 1 - - -  Tired, decreased energy 1 2 - - -  Change in appetite 0 0 - - -  Feeling bad or failure about yourself  0 1 - - -  Trouble concentrating 0 0 - - -  Moving slowly or fidgety/restless 0 1 - - -  Suicidal thoughts 0 0 - - -  PHQ-9 Score 3 8 - - -  Difficult doing work/chores Somewhat difficult Somewhat difficult - - -    GAD 7 : Generalized Anxiety Score 11/01/2021  Nervous, Anxious, on Edge 0  Control/stop worrying 0  Worry too much - different things 0  Trouble relaxing 0  Restless 0  Easily annoyed or irritable 0  Afraid - awful might happen 0  Total GAD 7 Score 0      Physical  Exam    Constitutional: Appears well-developed and well-nourished. No distress.  HENT:  Head: Normocephalic and atraumatic.  Skin: Skin is warm and dry. Not diaphoretic.  Normal pillion moles-she will continue to monitor Psychiatric: Normal mood and affect. Behavior is normal.      Assessment & Plan:    See Problem List for Assessment and Plan of chronic medical problems.

## 2021-11-01 ENCOUNTER — Ambulatory Visit (INDEPENDENT_AMBULATORY_CARE_PROVIDER_SITE_OTHER): Payer: No Typology Code available for payment source | Admitting: Family Medicine

## 2021-11-01 ENCOUNTER — Encounter: Payer: Self-pay | Admitting: Internal Medicine

## 2021-11-01 ENCOUNTER — Other Ambulatory Visit: Payer: Self-pay

## 2021-11-01 ENCOUNTER — Ambulatory Visit (INDEPENDENT_AMBULATORY_CARE_PROVIDER_SITE_OTHER): Payer: No Typology Code available for payment source | Admitting: Internal Medicine

## 2021-11-01 ENCOUNTER — Encounter: Payer: Self-pay | Admitting: Family Medicine

## 2021-11-01 VITALS — BP 114/80 | HR 97 | Temp 98.6°F | Ht 64.0 in | Wt 131.0 lb

## 2021-11-01 VITALS — BP 120/86 | HR 102 | Ht 64.0 in | Wt 131.0 lb

## 2021-11-01 DIAGNOSIS — J309 Allergic rhinitis, unspecified: Secondary | ICD-10-CM | POA: Insufficient documentation

## 2021-11-01 DIAGNOSIS — F32A Depression, unspecified: Secondary | ICD-10-CM

## 2021-11-01 DIAGNOSIS — F419 Anxiety disorder, unspecified: Secondary | ICD-10-CM

## 2021-11-01 DIAGNOSIS — M9902 Segmental and somatic dysfunction of thoracic region: Secondary | ICD-10-CM

## 2021-11-01 DIAGNOSIS — M9908 Segmental and somatic dysfunction of rib cage: Secondary | ICD-10-CM

## 2021-11-01 DIAGNOSIS — M9903 Segmental and somatic dysfunction of lumbar region: Secondary | ICD-10-CM | POA: Diagnosis not present

## 2021-11-01 DIAGNOSIS — M9904 Segmental and somatic dysfunction of sacral region: Secondary | ICD-10-CM

## 2021-11-01 DIAGNOSIS — G479 Sleep disorder, unspecified: Secondary | ICD-10-CM | POA: Diagnosis not present

## 2021-11-01 DIAGNOSIS — M9901 Segmental and somatic dysfunction of cervical region: Secondary | ICD-10-CM

## 2021-11-01 DIAGNOSIS — M940 Chondrocostal junction syndrome [Tietze]: Secondary | ICD-10-CM | POA: Diagnosis not present

## 2021-11-01 MED ORDER — LORAZEPAM 0.5 MG PO TABS
ORAL_TABLET | ORAL | 0 refills | Status: DC
  Filled 2021-11-01: qty 30, 30d supply, fill #0

## 2021-11-01 MED ORDER — ESCITALOPRAM OXALATE 10 MG PO TABS
10.0000 mg | ORAL_TABLET | Freq: Every day | ORAL | 1 refills | Status: DC
  Filled 2021-11-01: qty 90, 90d supply, fill #0

## 2021-11-01 NOTE — Assessment & Plan Note (Addendum)
Chronic Has some asthma like symptoms Allergies have gotten worse and also having asthma-like symptoms, but never diagnosed with asthma Continue daily antihistamine, Flonase as needed Continue albuterol as needed Will refer to allergy for further evaluation

## 2021-11-01 NOTE — Assessment & Plan Note (Signed)
Chronic Controlled, Stable Continue Kroh 10 mg daily, lorazepam 0.5 mg daily as needed

## 2021-11-01 NOTE — Assessment & Plan Note (Addendum)
Chronic Intermittent Continue taking lorazepam 0.5 mg nightly as needed-she does not take this often, but it is very effective and will continue to use as needed Refilled today

## 2021-11-01 NOTE — Patient Instructions (Signed)
65 mg of iron w 500mg  of vit C Stay active See me in 6 weeks

## 2021-11-02 NOTE — Assessment & Plan Note (Signed)
Slipped rib syndrome with exacerbation.  Seems to be the more on the left than the right today.  Does have Zanaflex if necessary.  Discussed icing regimen and home exercises.  Gust which activities to do which wants to avoid.  Increase activity slowly.  Discussed icing regimen.  Follow-up again in 6 to 8 weeks.

## 2021-12-04 ENCOUNTER — Other Ambulatory Visit: Payer: Self-pay

## 2021-12-12 NOTE — Progress Notes (Signed)
?Charlann Boxer D.O. ?[facility] ?[place] ?Phone: [phone] ?Subjective:   ?I, Stacy [name], am serving as a scribe for Dr. Hulan Saas. ? ?This visit occurred during the SARS-CoV-2 public health emergency.  Safety protocols were in place, including screening questions prior to the visit, additional usage of staff PPE, and extensive cleaning of exam room while observing appropriate contact time as indicated for disinfecting solutions.  ?I'm seeing this patient by the request  of:  Binnie Rail, MD ? ?CC: Hypermobility with chronic neck and back pain follow-up ? ?[id number]  ?Stacy [name] is a 31 y.o. female coming in with complaint of back and neck pain. OMT on 1/26/203. Patient states that her neck feels off. Also having pinching in R lumbar spine. Patient is typically able to adjust herself and this pain will go away but it is becoming more frequent.  ? ?Medications patient has been prescribed: None ? ?Taking: ? ? ?  ? ? ? ? ?Reviewed prior external information including notes and imaging from previsou exam, outside providers and external EMR if available.  ? ?As well as notes that were available from care everywhere and other healthcare systems. ? ?Past medical history, social, surgical and family history all reviewed in electronic medical record.  No pertanent information unless stated regarding to the chief complaint.  ? ?Past Medical History:  ?Diagnosis Date  ? Anemia   ? Hyperlipidemia   ?  ?Allergies  ?Allergen Reactions  ? Doxycycline Other (See Comments)  ?  Decreased WBC, Neutropenia  ? ? ? ?Review of Systems: ? No headache, visual changes, nausea, vomiting, diarrhea, constipation, dizziness, abdominal pain, skin rash, fevers, chills, night sweats, weight loss, swollen lymph nodes, body aches, joint swelling, chest pain, shortness of breath, mood changes. POSITIVE muscle aches ? ?Objective  ?Blood pressure 122/78, pulse 90, height 5\' 4"  (1.626 m),  weight 134 lb (60.8 kg), SpO2 99 %. ?  ?General: No apparent distress alert and oriented x3 mood and affect normal, dressed appropriately.  ?HEENT: Pupils equal, extraocular movements intact  ?Respiratory: Patient's speak in full sentences and does not appear short of breath  ?Cardiovascular: No lower extremity edema, non tender, no erythema  ?Hypermobility noted.  Still has tightness noted around the right side of the neck and the right parascapular region. ? ?Osteopathic findings ? ?C2 flexed rotated and side bent right ?C7 flexed rotated and side bent left ?T3 extended rotated and side bent right inhaled rib ?T9 extended rotated and side bent left ?L2 flexed rotated and side bent right ?Sacrum right on right ? ? ? ? ?  ?Assessment and Plan: ? ?Hypermobility syndrome ?Hypermobility continues to give patient some discomfort and pain but nothing that is stopping her from activity.  Discussed icing regimen and home exercises, which activities to doing which wants to avoid.  Patient does have muscle relaxer for breakthrough pain and given a refill today.  We will follow-up again in 6 to 8 weeks ?  ? ?Nonallopathic problems ? ?Decision today to treat with OMT was based on Physical Exam ? ?After verbal consent patient was treated with HVLA, ME, FPR techniques in cervical, rib, thoracic, lumbar, and sacral  areas ? ?Patient tolerated the procedure well with improvement in symptoms ? ?Patient given exercises, stretches and lifestyle modifications ? ?See medications in patient instructions if given ? ?Patient will follow up in 4-8 weeks ? ?  ? ? ?The above documentation has been reviewed and is accurate and  complete [name], DO ? ? ? ?  ? ? Note: This dictation was prepared with Dragon dictation along with smaller phrase technology. Any transcriptional errors that result from this process are unintentional.    ?  ?  ? ?

## 2021-12-13 ENCOUNTER — Other Ambulatory Visit: Payer: Self-pay

## 2021-12-13 ENCOUNTER — Encounter: Payer: Self-pay | Admitting: Family Medicine

## 2021-12-13 ENCOUNTER — Ambulatory Visit (INDEPENDENT_AMBULATORY_CARE_PROVIDER_SITE_OTHER): Payer: No Typology Code available for payment source | Admitting: Family Medicine

## 2021-12-13 VITALS — BP 122/78 | HR 90 | Ht 64.0 in | Wt 134.0 lb

## 2021-12-13 DIAGNOSIS — M9902 Segmental and somatic dysfunction of thoracic region: Secondary | ICD-10-CM

## 2021-12-13 DIAGNOSIS — M9908 Segmental and somatic dysfunction of rib cage: Secondary | ICD-10-CM | POA: Diagnosis not present

## 2021-12-13 DIAGNOSIS — M357 Hypermobility syndrome: Secondary | ICD-10-CM

## 2021-12-13 DIAGNOSIS — M9903 Segmental and somatic dysfunction of lumbar region: Secondary | ICD-10-CM | POA: Diagnosis not present

## 2021-12-13 DIAGNOSIS — M9904 Segmental and somatic dysfunction of sacral region: Secondary | ICD-10-CM | POA: Diagnosis not present

## 2021-12-13 DIAGNOSIS — M9901 Segmental and somatic dysfunction of cervical region: Secondary | ICD-10-CM | POA: Diagnosis not present

## 2021-12-13 MED ORDER — TIZANIDINE HCL 4 MG PO TABS
4.0000 mg | ORAL_TABLET | Freq: Every day | ORAL | 0 refills | Status: DC
  Filled 2021-12-13: qty 30, 30d supply, fill #0

## 2021-12-13 NOTE — Patient Instructions (Signed)
Refilled muscle relaxer ?See me in 6 weeks ?

## 2021-12-14 ENCOUNTER — Other Ambulatory Visit: Payer: Self-pay

## 2021-12-14 ENCOUNTER — Encounter: Payer: Self-pay | Admitting: Allergy

## 2021-12-14 ENCOUNTER — Ambulatory Visit (INDEPENDENT_AMBULATORY_CARE_PROVIDER_SITE_OTHER): Payer: No Typology Code available for payment source | Admitting: Allergy

## 2021-12-14 VITALS — BP 120/84 | HR 110 | Temp 98.6°F | Resp 18 | Ht 64.0 in | Wt 131.4 lb

## 2021-12-14 DIAGNOSIS — J3089 Other allergic rhinitis: Secondary | ICD-10-CM | POA: Diagnosis not present

## 2021-12-14 DIAGNOSIS — H1013 Acute atopic conjunctivitis, bilateral: Secondary | ICD-10-CM

## 2021-12-14 DIAGNOSIS — J4520 Mild intermittent asthma, uncomplicated: Secondary | ICD-10-CM | POA: Diagnosis not present

## 2021-12-14 MED ORDER — OLOPATADINE HCL 0.2 % OP SOLN
OPHTHALMIC | 2 refills | Status: DC
  Filled 2021-12-14: qty 2.5, fill #0
  Filled 2021-12-19: qty 2.5, 30d supply, fill #0

## 2021-12-14 MED ORDER — RYALTRIS 665-25 MCG/ACT NA SUSP: 2.0000 | Freq: Every day | NASAL | 2 refills | Status: DC

## 2021-12-14 NOTE — Progress Notes (Signed)
New Patient Note  RE: Stacy [name] MRN: [id number] DOB: 04/26/1991 Date of Office Visit: 12/14/2021  Referring provider: Pincus SanesBurns, Stacy J, MD Primary care provider: Pincus SanesBurns, Stacy J, MD  Chief Complaint: allergies  History of present illness: Stacy [name] is a 31 y.o. female presenting today for consultation for allergic rhinitis.   She states her allergies have been getting worse over the past 5 years and definitely worsening over past year.   She reports symptoms of congestion, ear fullness, sinus pressure, nasal drainage with throat clearing.  Occasional sneezing, itchy/watery eyes and has had crusting of eyes.   She had to start wearing a mask when working outside in the yard.  She has had to change her antihistamines often (has tried claritin, zyrtec and allegra).  She has used Sunocoflonase seasonally.  She was using antihistamine seasonally but lately needing it year-round. She states the zyrtec was not effective.  Allegra has helped the most right now.  She feels that she has been getting sick with URI more often and is having more congestion.  Whenever she is sick she needs to use her albuterol inhaler or nebulizer.  She has an albuterol inhaler that uses when she has a lot coughing related to her allergies.  She denies wheezing, chest tightness or shortness of breath. She does report sometimes having post-tussive emesis.  She recalls having 3 URI within the past year and each time has needed prednisone.  She has no history of asthma, eczema or food allergy.    Review of systems: Review of Systems  Constitutional: Negative.   HENT:         See HPI  Eyes:        See HPI  Respiratory: Negative.    Cardiovascular: Negative.   Gastrointestinal: Negative.   Musculoskeletal: Negative.   Skin: Negative.   Allergic/Immunologic: Negative.   Neurological: Negative.    All other systems negative unless noted above in HPI  Past medical history: Past Medical History:  Diagnosis Date    Anemia    Hyperlipidemia     Past surgical history: Past Surgical History:  Procedure Laterality Date   WISDOM TOOTH EXTRACTION      Family history:  Family History  Problem Relation Age of Onset   Diabetes Mother    Autoimmune disease Mother    Breast cancer Mother        7250's DCIS   Hyperlipidemia Father    Cancer Maternal Grandmother        breast   Breast cancer Maternal Grandmother    Cancer Paternal Grandmother        breast   Heart disease Paternal Grandmother    Breast cancer Paternal Grandmother     Social history: She lives in a home without carpeting with gas heating and central cooling.  No pets in the home.  Dog at parents home.  No concern for water damage, mildew or roaches in the home.  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  She denies a smoking history.    Medication List: Current Outpatient Medications  Medication Sig Dispense Refill   albuterol (VENTOLIN HFA) 108 (90 Base) MCG/ACT inhaler Inhale 2 puffs into the lungs every 6 (six) hours as needed for wheezing or shortness of breath. 8 g 0   escitalopram (LEXAPRO) 10 MG tablet Take 1 tablet (10 mg total) by mouth daily. 90 tablet 1   etonogestrel-ethinyl estradiol (NUVARING) 0.12-0.015 MG/24HR vaginal ring INSERT 1 RING VAGINALLY  ONCE EVERY 4 WEEKS; LEAVE IN FOR 3 WEEKS AND REMOVE FOR 1 WEEK OR AS DIRECTED 3 each 4   fluticasone (FLONASE) 50 MCG/ACT nasal spray Place 2 sprays into both nostrils daily. 16 g 5   ketoconazole (NIZORAL) 2 % shampoo Apply 1 application topically 2 (two) times a week. 120 mL 5   LORazepam (ATIVAN) 0.5 MG tablet TAKE 1 TABLET BY MOUTH AT BEDTIME AS NEEDED FOR ANXIETY OR SLEEP 30 tablet 0   tiZANidine (ZANAFLEX) 4 MG tablet Take 1 tablet (4 mg total) by mouth at bedtime. 30 tablet 0   vitamin C (ASCORBIC ACID) 500 MG tablet      Ferrous Gluconate 324 (37.5 Fe) MG TABS      propranolol (INDERAL) 10 MG tablet TAKE 1 TABLET  BY MOUTH DAILY AS NEEDED (PRESENTATIONS). (Patient not taking: Reported on 12/14/2021) 30 tablet 0   No current facility-administered medications for this visit.    Known medication allergies: Allergies  Allergen Reactions   Doxycycline Other (See Comments)    Decreased WBC, Neutropenia     Physical examination: Blood pressure 120/84, pulse (!) 110, temperature 98.6 F (37 C), resp. rate 18, height 5\' 4"  (1.626 m), weight 131 lb 6 oz (59.6 kg), SpO2 97 %.  General: Alert, interactive, in no acute distress. HEENT: PERRLA, TMs pearly gray, turbinates moderately edematous without discharge, post-pharynx non erythematous. Neck: Supple without lymphadenopathy. Lungs: Clear to auscultation without wheezing, rhonchi or rales. {no increased work of breathing. CV: Normal S1, S2 without murmurs. Abdomen: Nondistended, nontender. Skin: Warm and dry, without lesions or rashes. Extremities:  No clubbing, cyanosis or edema. Neuro:   Grossly intact.  Diagnositics/Labs:  Spirometry: FEV1: 2.6L 94%, FVC: 2.96L 91%, ratio consistent  nonobstructive pattern.  Allergy testing:   Airborne Adult Perc - 12/14/21 0913     Time Antigen Placed 0913    Allergen Manufacturer Waynette Buttery    Location Back    Number of Test 59    1. Control-Buffer 50% Glycerol Negative    2. Control-Histamine 1 mg/ml 2+    3. Albumin saline Negative    4. Bahia Negative    5. [place] 2+    6. Johnson Negative    7. Kentucky Blue Negative    8. Meadow Fescue Negative    9. Perennial Rye Negative    10. Sweet Vernal Negative    11. Timothy Negative    12. Cocklebur Negative    13. Burweed Marshelder Negative    14. Ragweed, short Negative    15. Ragweed, Giant Negative    16. Plantain,  English Negative    17. Lamb's Quarters Negative    18. Sheep Sorrell Negative    19. Rough Pigweed Negative    20. Marsh Elder, Rough Negative    21. Mugwort, Common Negative    22. Ash mix Negative    23. Birch mix Negative     24. Beech American 2+    25. Box, Elder Negative    26. Cedar, red Negative    27. Cottonwood, Guinea-Bissau Negative    28. Elm mix 2+    29. Hickory Negative    30. Maple mix Negative    31. Oak, Guinea-[place]issau mix Negative    32. Pecan Pollen Negative    33. Pine mix Negative    34. Sycamore Eastern Negative    35. Walnut, Black Pollen Negative    36. Alternaria alternata Negative    37. Cladosporium Herbarum Negative    38.  Aspergillus mix Negative    39. Penicillium mix Negative    40. Bipolaris sorokiniana (Helminthosporium) Negative    41. Drechslera spicifera (Curvularia) Negative    42. Mucor plumbeus Negative    43. Fusarium moniliforme Negative    44. Aureobasidium pullulans (pullulara) Negative    45. Rhizopus oryzae Negative    46. Botrytis cinera Negative    47. Epicoccum nigrum Negative    48. Phoma betae Negative    49. Candida Albicans Negative    50. Trichophyton mentagrophytes Negative    51. Mite, D Farinae  5,000 AU/ml Negative    52. Mite, D Pteronyssinus  5,000 AU/ml Negative    53. Cat Hair 10,000 BAU/ml Negative    54.  Dog Epithelia Negative    55. Mixed Feathers Negative    56. Horse Epithelia Negative    57. Cockroach, German Negative    58. Mouse Negative    59. Tobacco Leaf Negative             Intradermal - 12/14/21 1005     Time Antigen Placed 1005    Allergen Manufacturer Waynette Buttery    Location Arm    Number of Test 12    Intradermal Select    Control Negative    7 Grass Negative    Ragweed mix 2+    Weed mix Negative    Tree mix Omitted    Mold 1 Negative    Mold 2 Negative    Mold 3 Negative    Mold 4 Negative    Cat Negative    Dog Negative    Cockroach Negative    Mite mix Negative             Allergy testing results were read and interpreted by provider, documented by clinical staff.   Assessment and plan: Allergic rhinitis with conjunctivitis - Testing today showed: grasses, weeds, and trees. - Copy of test results  provided.  - Avoidance measures provided. - Continue with: Allegra (fexofenadine) 180mg  tablet once daily.    Recommend alternating between effective antihistamines about every 3-6 months.  Xyzal is another long-acting antihistamine you can try and see if effective - Start taking: Ryaltris (olopatadine/mometasone) two sprays per nostril 1-2 times daily and use for a week or so at a time before stopping once symptoms improve.  Pataday (olopatadine) one drop per eye once daily as needed for itchy/watery eyes - You can use an extra dose of the antihistamine, if needed, for breakthrough symptoms.  - Consider nasal saline rinses 1-2 times daily to remove allergens from the nasal cavities as well as help with mucous clearance (this is especially helpful to do before the nasal sprays are given) - Consider allergy shots as a means of long-term control. - Allergy shots "re-train" and "reset" the immune system to ignore environmental allergens and decrease the resulting immune response to those allergens (sneezing, itchy watery eyes, runny nose, nasal congestion, etc).    - Allergy shots improve symptoms in 75-85% of patients.  - We can discuss more at the next appointment if the medications are not working for you.  Reactive airway - Lung function testing is normal! - Have access to albuterol inhaler 2 puffs every 4-6 hours as needed for cough/wheeze/shortness of breath/chest tightness.  May use 15-20 minutes prior to activity.   Monitor frequency of use.    Follow-up in 4-6 months or sooner if needed  I appreciate the opportunity to take part in Sindi's care. Please do not  hesitate to contact me with questions.  Sincerely,   Margo Aye, MD Allergy/Immunology [facility]

## 2021-12-14 NOTE — Assessment & Plan Note (Signed)
Hypermobility continues to give patient some discomfort and pain but nothing that is stopping her from activity.  Discussed icing regimen and home exercises, which activities to doing which wants to avoid.  Patient does have muscle relaxer for breakthrough pain and given a refill today.  We will follow-up again in 6 to 8 weeks ?

## 2021-12-14 NOTE — Patient Instructions (Signed)
Allergic rhinitis with conjunctivitis ?- Testing today showed: grasses, weeds, and trees. ?- Copy of test results provided.  ?- Avoidance measures provided. ?- Continue with: Allegra (fexofenadine) 180mg  tablet once daily.    Recommend alternating between effective antihistamines about every 3-6 months.  Xyzal is another long-acting antihistamine you can try and see if effective ?- Start taking: Ryaltris (olopatadine/mometasone) two sprays per nostril 1-2 times daily and use for a week or so at a time before stopping once symptoms improve.  ?Pataday (olopatadine) one drop per eye once daily as needed for itchy/watery eyes ?- You can use an extra dose of the antihistamine, if needed, for breakthrough symptoms.  ?- Consider nasal saline rinses 1-2 times daily to remove allergens from the nasal cavities as well as help with mucous clearance (this is especially helpful to do before the nasal sprays are given) ?- Consider allergy shots as a means of long-term control. ?- Allergy shots "re-train" and "reset" the immune system to ignore environmental allergens and decrease the resulting immune response to those allergens (sneezing, itchy watery eyes, runny nose, nasal congestion, etc).    ?- Allergy shots improve symptoms in 75-85% of patients.  ?- We can discuss more at the next appointment if the medications are not working for you. ? ?Reactive airway ?- Lung function testing is normal! ?- Have access to albuterol inhaler 2 puffs every 4-6 hours as needed for cough/wheeze/shortness of breath/chest tightness.  May use 15-20 minutes prior to activity.   Monitor frequency of use.   ? ?Follow-up in 4-6 months or sooner if needed ?

## 2021-12-19 ENCOUNTER — Other Ambulatory Visit: Payer: Self-pay

## 2021-12-27 ENCOUNTER — Encounter: Payer: Self-pay | Admitting: Allergy

## 2021-12-27 ENCOUNTER — Other Ambulatory Visit: Payer: Self-pay | Admitting: *Deleted

## 2021-12-27 ENCOUNTER — Other Ambulatory Visit (HOSPITAL_COMMUNITY): Payer: Self-pay

## 2021-12-27 MED ORDER — LEVOCETIRIZINE DIHYDROCHLORIDE 5 MG PO TABS
5.0000 mg | ORAL_TABLET | Freq: Every evening | ORAL | 5 refills | Status: DC
  Filled 2021-12-27: qty 90, 90d supply, fill #0
  Filled 2022-04-25: qty 90, 90d supply, fill #1

## 2022-01-02 ENCOUNTER — Other Ambulatory Visit (HOSPITAL_COMMUNITY): Payer: Self-pay

## 2022-01-24 ENCOUNTER — Ambulatory Visit: Payer: No Typology Code available for payment source | Admitting: Family Medicine

## 2022-02-06 NOTE — Progress Notes (Signed)
?Charlann Boxer D.O. ?[facility] ?[place] ?Phone: [phone] ?Subjective:   ?I, Stacy [name], am serving as a scribe for Dr. Hulan Saas. ? ?This visit occurred during the SARS-CoV-2 public health emergency.  Safety protocols were in place, including screening questions prior to the visit, additional usage of staff PPE, and extensive cleaning of exam room while observing appropriate contact time as indicated for disinfecting solutions.  ? ? ?I'm seeing this patient by the request  of:  Binnie Rail, MD ? ?CC: Neck and back pain follow-up ? ?[id number]  ?Stacy [name] is a 31 y.o. female coming in with complaint of back and neck pain. OMT on 12/13/2021. Patient states that she has a pinch in R side of lumbar spine, neck is tight, and L elbow is out of alignment.  Patient states nothing severe.  It seems like her normal.  Is still frustrating and can stop her from daily activities sometimes. ? ?Medications patient has been prescribed: Zanaflex ? ?Taking: ? ? ?  ? ? ? ? ?Reviewed prior external information including notes and imaging from previsou exam, outside providers and external EMR if available.  ? ?As well as notes that were available from care everywhere and other healthcare systems. ? ?Past medical history, social, surgical and family history all reviewed in electronic medical record.  No pertanent information unless stated regarding to the chief complaint.  ? ?Past Medical History:  ?Diagnosis Date  ? Anemia   ? Hyperlipidemia   ?  ?Allergies  ?Allergen Reactions  ? Doxycycline Other (See Comments)  ?  Decreased WBC, Neutropenia  ? ? ? ?Review of Systems: ? No headache, visual changes, nausea, vomiting, diarrhea, constipation, dizziness, abdominal pain, skin rash, fevers, chills, night sweats, weight loss, swollen lymph nodes, body aches, joint swelling, chest pain, shortness of breath, mood changes. POSITIVE muscle aches ? ?Objective  ?Blood pressure 118/74,  pulse 88, height 5\' 4"  (1.626 m), weight 138 lb (62.6 kg), SpO2 98 %. ?  ?General: No apparent distress alert and oriented x3 mood and affect normal, dressed appropriately.  ?HEENT: Pupils equal, extraocular movements intact  ?Respiratory: Patient's speak in full sentences and does not appear short of breath  ?Cardiovascular: No lower extremity edema, non tender, no erythema  ?Hypermobility noted.  Patient does have some tenderness to palpation mostly in the right parascapular region.  Very mild tightness noted with FABER test. ? ?Osteopathic findings ? ?C2 flexed rotated and side bent right ?C7 flexed rotated and side bent left ?T3 extended rotated and side bent right inhaled rib ?T9 extended rotated and side bent left ?L2 flexed rotated and side bent right ?Sacrum right on right ? ? ? ? ?  ?Assessment and Plan: ? ?Slipped rib syndrome ?Discussed HEP  ?Discussed activities  ?Respond to OMT ?No change in management except will get labs for iron to see if causing more discomfort   ?  ? ?Nonallopathic problems ? ?Decision today to treat with OMT was based on Physical Exam ? ?After verbal consent patient was treated with HVLA, ME, FPR techniques in cervical, rib, thoracic, lumbar, and sacral  areas ? ?Patient tolerated the procedure well with improvement in symptoms ? ?Patient given exercises, stretches and lifestyle modifications ? ?See medications in patient instructions if given ? ?Patient will follow up in 4-8 weeks ? ?  ? ? ?The above documentation has been reviewed and is accurate and complete [name], DO ? ? ? ?  ? ?  Note: This dictation was prepared with Dragon dictation along with smaller phrase technology. Any transcriptional errors that result from this process are unintentional.    ?  ?  ? ?

## 2022-02-07 ENCOUNTER — Ambulatory Visit (INDEPENDENT_AMBULATORY_CARE_PROVIDER_SITE_OTHER): Payer: No Typology Code available for payment source | Admitting: Family Medicine

## 2022-02-07 VITALS — BP 118/74 | HR 88 | Ht 64.0 in | Wt 138.0 lb

## 2022-02-07 DIAGNOSIS — M9904 Segmental and somatic dysfunction of sacral region: Secondary | ICD-10-CM

## 2022-02-07 DIAGNOSIS — M2550 Pain in unspecified joint: Secondary | ICD-10-CM

## 2022-02-07 DIAGNOSIS — M9908 Segmental and somatic dysfunction of rib cage: Secondary | ICD-10-CM

## 2022-02-07 DIAGNOSIS — M9903 Segmental and somatic dysfunction of lumbar region: Secondary | ICD-10-CM | POA: Diagnosis not present

## 2022-02-07 DIAGNOSIS — M9902 Segmental and somatic dysfunction of thoracic region: Secondary | ICD-10-CM

## 2022-02-07 DIAGNOSIS — M940 Chondrocostal junction syndrome [Tietze]: Secondary | ICD-10-CM

## 2022-02-07 DIAGNOSIS — M9901 Segmental and somatic dysfunction of cervical region: Secondary | ICD-10-CM | POA: Diagnosis not present

## 2022-02-07 NOTE — Assessment & Plan Note (Signed)
Discussed HEP  ?Discussed activities  ?Respond to OMT ?No change in management except will get labs for iron to see if causing more discomfort   ? ?

## 2022-02-14 ENCOUNTER — Other Ambulatory Visit (INDEPENDENT_AMBULATORY_CARE_PROVIDER_SITE_OTHER): Payer: No Typology Code available for payment source

## 2022-02-14 DIAGNOSIS — M2550 Pain in unspecified joint: Secondary | ICD-10-CM

## 2022-02-14 LAB — CBC WITH DIFFERENTIAL/PLATELET
Basophils Absolute: 0 10*3/uL (ref 0.0–0.1)
Basophils Relative: 1 % (ref 0.0–3.0)
Eosinophils Absolute: 0.1 10*3/uL (ref 0.0–0.7)
Eosinophils Relative: 3 % (ref 0.0–5.0)
HCT: 40.1 % (ref 36.0–46.0)
Hemoglobin: 13.5 g/dL (ref 12.0–15.0)
Lymphocytes Relative: 47.3 % — ABNORMAL HIGH (ref 12.0–46.0)
Lymphs Abs: 1.6 10*3/uL (ref 0.7–4.0)
MCHC: 33.8 g/dL (ref 30.0–36.0)
MCV: 89.2 fl (ref 78.0–100.0)
Monocytes Absolute: 0.4 10*3/uL (ref 0.1–1.0)
Monocytes Relative: 11 % (ref 3.0–12.0)
Neutro Abs: 1.3 10*3/uL — ABNORMAL LOW (ref 1.4–7.7)
Neutrophils Relative %: 37.7 % — ABNORMAL LOW (ref 43.0–77.0)
Platelets: 290 10*3/uL (ref 150.0–400.0)
RBC: 4.49 Mil/uL (ref 3.87–5.11)
RDW: 13.2 % (ref 11.5–15.5)
WBC: 3.5 10*3/uL — ABNORMAL LOW (ref 4.0–10.5)

## 2022-02-14 LAB — IBC PANEL
Iron: 134 ug/dL (ref 42–145)
Saturation Ratios: 32.9 % (ref 20.0–50.0)
TIBC: 407.4 ug/dL (ref 250.0–450.0)
Transferrin: 291 mg/dL (ref 212.0–360.0)

## 2022-02-14 LAB — FERRITIN: Ferritin: 20.8 ng/mL (ref 10.0–291.0)

## 2022-02-14 LAB — VITAMIN D 25 HYDROXY (VIT D DEFICIENCY, FRACTURES): VITD: 46.13 ng/mL (ref 30.00–100.00)

## 2022-03-01 ENCOUNTER — Other Ambulatory Visit: Payer: Self-pay

## 2022-03-07 ENCOUNTER — Other Ambulatory Visit: Payer: Self-pay

## 2022-03-07 MED ORDER — ETONOGESTREL-ETHINYL ESTRADIOL 0.12-0.015 MG/24HR VA RING
VAGINAL_RING | VAGINAL | 4 refills | Status: DC
  Filled 2022-03-07 – 2022-05-23 (×2): qty 3, 84d supply, fill #0
  Filled 2022-08-21: qty 3, 84d supply, fill #1
  Filled 2022-11-27: qty 3, 84d supply, fill #2
  Filled 2023-02-12: qty 3, 84d supply, fill #3

## 2022-03-12 ENCOUNTER — Other Ambulatory Visit: Payer: Self-pay | Admitting: Internal Medicine

## 2022-03-13 ENCOUNTER — Other Ambulatory Visit: Payer: Self-pay

## 2022-03-14 ENCOUNTER — Other Ambulatory Visit: Payer: Self-pay

## 2022-03-14 MED ORDER — LORAZEPAM 0.5 MG PO TABS
ORAL_TABLET | ORAL | 0 refills | Status: DC
Start: 2022-03-14 — End: 2022-05-04
  Filled 2022-03-14: qty 30, 30d supply, fill #0

## 2022-03-27 NOTE — Progress Notes (Unsigned)
[name] [address] [place] [address] Phone: [phone] Subjective:   Stacy [name], am serving as a scribe for Dr. Antoine Primas.   I'm seeing this patient by the request  of:  Pincus Sanes, MD  CC: back and neck pain   UTM:[id number]  Stacy [name] is a 31 y.o. female coming in with complaint of back and neck pain. OMT on 02/07/2022. Patient states that L side of neck and L scapular pain have increased. Used mm relaxer last night.   Medications patient has been prescribed: None  Taking:         Reviewed prior external information including notes and imaging from previsou exam, outside providers and external EMR if available.   As well as notes that were available from care everywhere and other healthcare systems.  Past medical history, social, surgical and family history all reviewed in electronic medical record.  No pertanent information unless stated regarding to the chief complaint.   Past Medical History:  Diagnosis Date   Anemia    Hyperlipidemia     Allergies  Allergen Reactions   Doxycycline Other (See Comments)    Decreased WBC, Neutropenia     Review of Systems:  No headache, visual changes, nausea, vomiting, diarrhea, constipation, dizziness, abdominal pain, skin rash, fevers, chills, night sweats, weight loss, swollen lymph nodes, body aches, joint swelling, chest pain, shortness of breath, mood changes. POSITIVE muscle aches  Objective  Blood pressure 118/82, pulse 87, height 5\' 4"  (1.626 m), weight 141 lb (64 kg), SpO2 97 %.   General: No apparent distress alert and oriented x3 mood and affect normal, dressed appropriately.  HEENT: Pupils equal, extraocular movements intact  Respiratory: Patient's speak in full sentences and does not appear short of breath  Cardiovascular: No lower extremity edema, non tender, no erythema  Gait MSK:  Back hypermobility noted.  Neck exam shows the patient has  significant increase in tightness noted above the cervical spine.  Seems to be mostly in the occipital area on the C2 area.  Osteopathic findings  C2 flexed rotated and side bent left C6 flexed rotated and side bent left T3 extended rotated and side bent left inhaled rib T9 extended rotated and side bent left T11 extended and rotated and side bent right L2 flexed rotated and side bent right L5 flexed rotated and side bent left Sacrum right on right       Assessment and Plan:  Slipped rib syndrome Chronic and worsening patient had more pain actually on the left side at the moment.  Seem to give her more difficulty at the occipital area as well.  Still has the hypermobility and will continue to work on the posture and ergonomics as well as the strengthening.  Discussed which activities to do and which ones to avoid, increase activity slowly otherwise.  Follow-up with me again in 6 to 8 weeks.    Nonallopathic problems  Decision today to treat with OMT was based on Physical Exam  After verbal consent patient was treated with HVLA, ME, FPR techniques in cervical, rib, thoracic, lumbar, and sacral  areas  Patient tolerated the procedure well with improvement in symptoms  Patient given exercises, stretches and lifestyle modifications  See medications in patient instructions if given  Patient will follow up in 4-8 weeks             Note: This dictation was prepared with Dragon dictation along with smaller phrase technology. Any transcriptional  errors that result from this process are unintentional.

## 2022-03-28 ENCOUNTER — Ambulatory Visit (INDEPENDENT_AMBULATORY_CARE_PROVIDER_SITE_OTHER): Payer: No Typology Code available for payment source | Admitting: Family Medicine

## 2022-03-28 VITALS — BP 118/82 | HR 87 | Ht 64.0 in | Wt 141.0 lb

## 2022-03-28 DIAGNOSIS — M9908 Segmental and somatic dysfunction of rib cage: Secondary | ICD-10-CM

## 2022-03-28 DIAGNOSIS — M9904 Segmental and somatic dysfunction of sacral region: Secondary | ICD-10-CM | POA: Diagnosis not present

## 2022-03-28 DIAGNOSIS — M9901 Segmental and somatic dysfunction of cervical region: Secondary | ICD-10-CM

## 2022-03-28 DIAGNOSIS — M940 Chondrocostal junction syndrome [Tietze]: Secondary | ICD-10-CM

## 2022-03-28 DIAGNOSIS — M9903 Segmental and somatic dysfunction of lumbar region: Secondary | ICD-10-CM | POA: Diagnosis not present

## 2022-03-28 DIAGNOSIS — M9902 Segmental and somatic dysfunction of thoracic region: Secondary | ICD-10-CM | POA: Diagnosis not present

## 2022-03-28 NOTE — Assessment & Plan Note (Signed)
Chronic and worsening patient had more pain actually on the left side at the moment.  Seem to give her more difficulty at the occipital area as well.  Still has the hypermobility and will continue to work on the posture and ergonomics as well as the strengthening.  Discussed which activities to do and which ones to avoid, increase activity slowly otherwise.  Follow-up with me again in 6 to 8 weeks.

## 2022-03-28 NOTE — Patient Instructions (Signed)
Good to see you I have no idea what you did 3 IBU 3x  day for 3 days Take mm relaxer for next 3 night See me in 5-6 weeks

## 2022-04-25 ENCOUNTER — Other Ambulatory Visit: Payer: Self-pay

## 2022-05-02 ENCOUNTER — Other Ambulatory Visit (HOSPITAL_COMMUNITY): Payer: Self-pay

## 2022-05-03 ENCOUNTER — Encounter: Payer: Self-pay | Admitting: Internal Medicine

## 2022-05-03 NOTE — Patient Instructions (Addendum)
   Have blood work done.     Medications changes include :   none   Your prescription(s) have been sent to your pharmacy.      Return in about 6 months (around 11/04/2022) for Physical Exam.

## 2022-05-03 NOTE — Progress Notes (Unsigned)
Subjective:    Patient ID: Stacy [name], female    DOB: [birth year], 31 y.o.   MRN: [id number]     HPI Stacy [name] is here for follow up of her chronic medical problems, including anxiety, depression, sleep diff  Her anxiety and depression are well controlled.  She has been trying to wean herself off the Lexapro, but after being off of it for a couple of days despite weaning very slowly she will get some dizziness, spacey feeling in her head and have to take another dose.  She is on a minimal dose now.  She may continue to wean herself or stay on a low dose.  She thinks she may need to go back on it in the wintertime.  Work has been stressful.  Weight gain-she has gained some weight and she is not sure exactly why.  She has been exercising a little bit more and trying to do that more regularly.  She denies any changes in her diet.  She is unsure if it is hormonal or her thyroid -she does not want to gain additional weight.  Her cholesterol was little higher last year and with the weight gain she does worry about where her cholesterol is.  Sleep is okay-sometimes has to take the lorazepam at night and that does help.  She does not take it on a regular basis.  Medications and allergies reviewed with patient and updated if appropriate.  Current Outpatient Medications on File Prior to Visit  Medication Sig Dispense Refill   albuterol (VENTOLIN HFA) 108 (90 Base) MCG/ACT inhaler Inhale 2 puffs into the lungs every 6 (six) hours as needed for wheezing or shortness of breath. 8 g 0   escitalopram (LEXAPRO) 10 MG tablet Take 1 tablet (10 mg total) by mouth daily. 90 tablet 1   etonogestrel-ethinyl estradiol (NUVARING) 0.12-0.015 MG/24HR vaginal ring INSERT 1 RING VAGINALLY ONCE EVERY 4 WEEKS; LEAVE IN FOR 3 WEEKS AND REMOVE FOR 1 WEEK OR AS DIRECTED 3 each 4   Ferrous Gluconate 324 (37.5 Fe) MG TABS      fluticasone (FLONASE) 50 MCG/ACT nasal spray Place 2 sprays into both nostrils daily. 16 g 5    ketoconazole (NIZORAL) 2 % shampoo Apply 1 application topically 2 (two) times a week. 120 mL 5   levocetirizine (XYZAL) 5 MG tablet Take 1 tablet (5 mg total) by mouth every evening. 30 tablet 5   LORazepam (ATIVAN) 0.5 MG tablet TAKE 1 TABLET BY MOUTH AT BEDTIME AS NEEDED FOR ANXIETY OR SLEEP 30 tablet 0   Olopatadine HCl 0.2 % SOLN Place one drop per eye once daily as needed for itchy/watery eyes 2.5 mL 2   Olopatadine-Mometasone (RYALTRIS) 665-25 MCG/ACT SUSP Place 2 sprays into the nose daily in the afternoon. 1-2 times daily 29 g 2   tiZANidine (ZANAFLEX) 4 MG tablet Take 1 tablet (4 mg total) by mouth at bedtime. 30 tablet 0   vitamin C (ASCORBIC ACID) 500 MG tablet      propranolol (INDERAL) 10 MG tablet TAKE 1 TABLET BY MOUTH DAILY AS NEEDED (PRESENTATIONS). (Patient not taking: Reported on 12/14/2021) 30 tablet 0   No current facility-administered medications on file prior to visit.     Review of Systems  Constitutional:  Negative for fever.  Respiratory:  Negative for cough, shortness of breath and wheezing.   Cardiovascular:  Negative for chest pain, palpitations and leg swelling.  Neurological:  Positive for dizziness (occ lightheadedness/spacey feeling at times) and  light-headedness (occ lightheadedness/spacey feeling at times). Negative for headaches.       Objective:   Vitals:   05/04/22 1458  BP: 108/76  Pulse: 87  Temp: 98.6 F (37 C)  SpO2: 98%   BP Readings from Last 3 Encounters:  05/04/22 108/76  05/04/22 116/80  03/28/22 118/82   Wt Readings from Last 3 Encounters:  05/04/22 143 lb (64.9 kg)  05/04/22 142 lb (64.4 kg)  03/28/22 141 lb (64 kg)   Body mass index is 24.55 kg/m.    Physical Exam Constitutional:      General: She is not in acute distress.    Appearance: Normal appearance.  HENT:     Head: Normocephalic and atraumatic.  Eyes:     Conjunctiva/sclera: Conjunctivae normal.  Cardiovascular:     Rate and Rhythm: Normal rate and  regular rhythm.     Heart sounds: Normal heart sounds. No murmur heard. Pulmonary:     Effort: Pulmonary effort is normal. No respiratory distress.     Breath sounds: Normal breath sounds. No wheezing.  Musculoskeletal:     Cervical back: Neck supple.     Right lower leg: No edema.     Left lower leg: No edema.  Lymphadenopathy:     Cervical: No cervical adenopathy.  Skin:    General: Skin is warm and dry.     Findings: No rash.  Neurological:     Mental Status: She is alert. Mental status is at baseline.  Psychiatric:        Mood and Affect: Mood normal.        Behavior: Behavior normal.        Lab Results  Component Value Date   WBC 3.5 (L) 02/14/2022   HGB 13.5 02/14/2022   HCT 40.1 02/14/2022   PLT 290.0 02/14/2022   GLUCOSE 84 04/26/2021   CHOL 233 (H) 04/26/2021   TRIG 84.0 04/26/2021   HDL 89.50 04/26/2021   LDLCALC 127 (H) 04/26/2021   ALT 7 04/26/2021   AST 18 04/26/2021   NA 136 04/26/2021   K 4.1 04/26/2021   CL 102 04/26/2021   CREATININE 0.82 04/26/2021   BUN 10 04/26/2021   CO2 28 04/26/2021   TSH 1.06 04/26/2021   HGBA1C 4.9 04/22/2020     Assessment & Plan:    See Problem List for Assessment and Plan of chronic medical problems.

## 2022-05-04 ENCOUNTER — Ambulatory Visit (INDEPENDENT_AMBULATORY_CARE_PROVIDER_SITE_OTHER): Payer: No Typology Code available for payment source | Admitting: Internal Medicine

## 2022-05-04 ENCOUNTER — Ambulatory Visit (INDEPENDENT_AMBULATORY_CARE_PROVIDER_SITE_OTHER): Payer: No Typology Code available for payment source | Admitting: Allergy

## 2022-05-04 ENCOUNTER — Encounter: Payer: Self-pay | Admitting: Allergy

## 2022-05-04 ENCOUNTER — Other Ambulatory Visit: Payer: Self-pay

## 2022-05-04 VITALS — BP 116/80 | HR 100 | Temp 97.6°F | Resp 16 | Ht 64.0 in | Wt 142.0 lb

## 2022-05-04 VITALS — BP 108/76 | HR 87 | Temp 98.6°F | Ht 64.0 in | Wt 143.0 lb

## 2022-05-04 DIAGNOSIS — E7849 Other hyperlipidemia: Secondary | ICD-10-CM | POA: Diagnosis not present

## 2022-05-04 DIAGNOSIS — F418 Other specified anxiety disorders: Secondary | ICD-10-CM

## 2022-05-04 DIAGNOSIS — J3089 Other allergic rhinitis: Secondary | ICD-10-CM | POA: Diagnosis not present

## 2022-05-04 DIAGNOSIS — F419 Anxiety disorder, unspecified: Secondary | ICD-10-CM

## 2022-05-04 DIAGNOSIS — J4520 Mild intermittent asthma, uncomplicated: Secondary | ICD-10-CM

## 2022-05-04 DIAGNOSIS — F32A Depression, unspecified: Secondary | ICD-10-CM

## 2022-05-04 DIAGNOSIS — G479 Sleep disorder, unspecified: Secondary | ICD-10-CM

## 2022-05-04 DIAGNOSIS — H1013 Acute atopic conjunctivitis, bilateral: Secondary | ICD-10-CM

## 2022-05-04 DIAGNOSIS — R635 Abnormal weight gain: Secondary | ICD-10-CM

## 2022-05-04 MED ORDER — LORAZEPAM 0.5 MG PO TABS
ORAL_TABLET | ORAL | 0 refills | Status: DC
  Filled 2022-05-04: qty 30, 30d supply, fill #0

## 2022-05-04 NOTE — Patient Instructions (Addendum)
Allergic rhinitis with conjunctivitis - Continue avoidance measures grasses, weeds, and trees. - Continue with: Xyzal daily.  Every 6 months can rotate between Xyzal and Allegra.  Ryaltris (olopatadine/mometasone) two sprays per nostril 1-2 times daily as needed Pataday (olopatadine) one drop per eye once daily as needed for itchy/watery eyes - You can use an extra dose of the antihistamine, if needed, for breakthrough symptoms.  - Consider nasal saline rinses 1-2 times daily to remove allergens from the nasal cavities as well as help with mucous clearance (this is especially helpful to do before the nasal sprays are given) - Consider allergy shots as a means of long-term control if medication management is not effective.  Discussed our 2 options for starting shots: RUSH vs traditional.   Informational packet provided.  - Allergy shots "re-train" and "reset" the immune system to ignore environmental allergens and decrease the resulting immune response to those allergens (sneezing, itchy watery eyes, runny nose, nasal congestion, etc).    - Allergy shots improve symptoms in 80-85% of patients.   Reactive airway - Have access to albuterol inhaler 2 puffs every 4-6 hours as needed for cough/wheeze/shortness of breath/chest tightness.  May use 15-20 minutes prior to activity.   Monitor frequency of use.    Follow-up in 6-12 months or sooner if needed

## 2022-05-04 NOTE — Progress Notes (Signed)
Follow-up Note  RE: Stacy [name] MRN: [id number] DOB: Dec 07, 1990 Date of Office Visit: 05/04/2022   History of present illness: Stacy [name] is a 31 y.o. female presenting today for follow-up of allergic rhinitis with conjunctivitis and reactive airway.  She was last seen in the office on 12/14/2021 by myself. She has been doing well up until this week.  She states both the poor air quality the first days when it was initially reported she tried her best to avoid being outside.  However this week when they reported AirPods going to be bad again she forgot about this and did have lunch outside on Monday.  The next day she was noticing some scratchiness of the throat and in throat clearing.  She also been having some issues with her right eye with some weepiness and a bit irritated.  She states a while back she was at that her something flew into the eye.  She does have a history of having a corneal ulcer several years ago.  She does state when she uses the Pataday for itchy or watery eyes it does seem to be helpful. She did change to xyzal from Allegra.  She also has been using the Ryaltris as needed when she is having more drainage or congestion and it does help him. She has not required albuterol use since last visit.  Review of systems: Review of Systems  Constitutional: Negative.   HENT: Negative.    Eyes:        See HPI   Respiratory: Negative.    Cardiovascular: Negative.   Gastrointestinal: Negative.   Musculoskeletal: Negative.   Skin: Negative.   Allergic/Immunologic: Negative.   Neurological: Negative.      All other systems negative unless noted above in HPI  Past medical/social/surgical/family history have been reviewed and are unchanged unless specifically indicated below.  No changes  Medication List: Current Outpatient Medications  Medication Sig Dispense Refill   escitalopram (LEXAPRO) 10 MG tablet Take 1 tablet (10 mg total) by mouth daily. 90 tablet 1    etonogestrel-ethinyl estradiol (NUVARING) 0.12-0.015 MG/24HR vaginal ring INSERT 1 RING VAGINALLY ONCE EVERY 4 WEEKS; LEAVE IN FOR 3 WEEKS AND REMOVE FOR 1 WEEK OR AS DIRECTED 3 each 4   Ferrous Gluconate 324 (37.5 Fe) MG TABS      fluticasone (FLONASE) 50 MCG/ACT nasal spray Place 2 sprays into both nostrils daily. 16 g 5   ketoconazole (NIZORAL) 2 % shampoo Apply 1 application topically 2 (two) times a week. 120 mL 5   levocetirizine (XYZAL) 5 MG tablet Take 1 tablet (5 mg total) by mouth every evening. 30 tablet 5   LORazepam (ATIVAN) 0.5 MG tablet TAKE 1 TABLET BY MOUTH AT BEDTIME AS NEEDED FOR ANXIETY OR SLEEP 30 tablet 0   Olopatadine HCl 0.2 % SOLN Place one drop per eye once daily as needed for itchy/watery eyes 2.5 mL 2   Olopatadine-Mometasone (RYALTRIS) 665-25 MCG/ACT SUSP Place 2 sprays into the nose daily in the afternoon. 1-2 times daily 29 g 2   tiZANidine (ZANAFLEX) 4 MG tablet Take 1 tablet (4 mg total) by mouth at bedtime. 30 tablet 0   vitamin C (ASCORBIC ACID) 500 MG tablet      albuterol (VENTOLIN HFA) 108 (90 Base) MCG/ACT inhaler Inhale 2 puffs into the lungs every 6 (six) hours as needed for wheezing or shortness of breath. (Patient not taking: Reported on 05/04/2022) 8 g 0   propranolol (INDERAL) 10 MG tablet TAKE 1  TABLET BY MOUTH DAILY AS NEEDED (PRESENTATIONS). (Patient not taking: Reported on 12/14/2021) 30 tablet 0   No current facility-administered medications for this visit.     Known medication allergies: Allergies  Allergen Reactions   Doxycycline Other (See Comments)    Decreased WBC, Neutropenia     Physical examination: Blood pressure 116/80, pulse 100, temperature 97.6 F (36.4 C), resp. rate 16, height 5\' 4"  (1.626 m), weight 142 lb (64.4 kg), SpO2 99 %.  General: Alert, interactive, in no acute distress. HEENT: PERRLA, no conjunctival or scleral injection, EOMI, no discharge or drainage from the eyes, TMs pearly gray, turbinates minimally edematous  without discharge, post-pharynx non erythematous. Neck: Supple without lymphadenopathy. Lungs: Clear to auscultation without wheezing, rhonchi or rales. {no increased work of breathing. CV: Normal S1, S2 without murmurs. Abdomen: Nondistended, nontender. Skin: Warm and dry, without lesions or rashes. Extremities:  No clubbing, cyanosis or edema. Neuro:   Grossly intact.  Diagnositics/Labs: None today  Assessment and plan:   Allergic rhinitis with conjunctivitis - Continue avoidance measures grasses, weeds, and trees. - Continue with: Xyzal daily.  Every 6 months can rotate between Xyzal and Allegra.  Ryaltris (olopatadine/mometasone) two sprays per nostril 1-2 times daily as needed Pataday (olopatadine) one drop per eye once daily as needed for itchy/watery eyes - You can use an extra dose of the antihistamine, if needed, for breakthrough symptoms.  - Consider nasal saline rinses 1-2 times daily to remove allergens from the nasal cavities as well as help with mucous clearance (this is especially helpful to do before the nasal sprays are given) - Consider allergy shots as a means of long-term control if medication management is not effective.  Discussed our 2 options for starting shots: RUSH vs traditional.   Informational packet provided.  - Allergy shots "re-train" and "reset" the immune system to ignore environmental allergens and decrease the resulting immune response to those allergens (sneezing, itchy watery eyes, runny nose, nasal congestion, etc).    - Allergy shots improve symptoms in 80-85% of patients.   Reactive airway - Have access to albuterol inhaler 2 puffs every 4-6 hours as needed for cough/wheeze/shortness of breath/chest tightness.  May use 15-20 minutes prior to activity.   Monitor frequency of use.    Follow-up in 6-12 months or sooner if needed  I appreciate the opportunity to take part in Stacy [name]'s care. Please do not hesitate to contact me with  questions.  Sincerely,   8-12, MD Allergy/Immunology [facility]

## 2022-05-05 DIAGNOSIS — R635 Abnormal weight gain: Secondary | ICD-10-CM | POA: Insufficient documentation

## 2022-05-05 DIAGNOSIS — F418 Other specified anxiety disorders: Secondary | ICD-10-CM | POA: Insufficient documentation

## 2022-05-05 NOTE — Assessment & Plan Note (Signed)
Chronic Intermittent Okay to continue lorazepam 0.5 mg nightly as needed-she does keep this to a minimum-refilled today

## 2022-05-05 NOTE — Assessment & Plan Note (Signed)
Has anxiety related to presentations Continue propranolol 10 mg daily as needed for presentations

## 2022-05-05 NOTE — Assessment & Plan Note (Signed)
Chronic Was on atorvastatin in the past, but has been able to control with diet and lifestyle Has gained some weight and is concerned about her cholesterol Check lipid panel, CMP, TSH

## 2022-05-05 NOTE — Assessment & Plan Note (Signed)
Chronic Has some seasonal affective depression and generalized anxiety Has decreased Lexapro to 5 mg and not taking on a daily basis-has tried been trying to get off of it, which she may continue to wean herself off of May need to restart or go on consistently in the winter for SAD Discussed altering doses depending on the season Also discussed possible Wellbutrin for aligner which is needed to the to go on and off of it and effective for seasonal depression, but may not be as effective for her anxiety Continue regular exercise She will let me know if she wants to further adjust the Lexapro

## 2022-05-05 NOTE — Assessment & Plan Note (Signed)
New Has been experiencing some weight gain that does not make sense She is more active than she was in the past-continue regular exercise She has not changed her diet and does not feel like she is eating in a way that she should be gaining weight We will check TSH to rule out thyroid disease Possibly hormonal or stress related-there has been increased stress at work She is on the NuvaRing and has had some change in her menses-can discuss with her gynecologist

## 2022-05-10 NOTE — Progress Notes (Signed)
  [name] [address] [place] [address] Phone: [phone] Subjective:   INadine Counts, am serving as a scribe for Dr. Antoine Primas.  I'm seeing this patient by the request  of:  Pincus Sanes, MD  CC: Back and neck pain follow-up  [id number]  Stacy [name] is a 31 y.o. female coming in with complaint of back and neck pain. OMT on 03/28/2022. Patient states doing well. Everything is about the same. No new issues.  Medications patient has been prescribed: None  Taking:       Past Medical History:  Diagnosis Date   Anemia    Hyperlipidemia     Allergies  Allergen Reactions   Doxycycline Other (See Comments)    Decreased WBC, Neutropenia     Review of Systems:  No headache, visual changes, nausea, vomiting, diarrhea, constipation, dizziness, abdominal pain, skin rash, fevers, chills, night sweats, weight loss, swollen lymph nodes, body aches, joint swelling, chest pain, shortness of breath, mood changes. POSITIVE muscle aches  Objective  Blood pressure 118/84, pulse 98, height 5\' 4"  (1.626 m), weight 143 lb (64.9 kg), SpO2 99 %.   General: No apparent distress alert and oriented x3 mood and affect normal, dressed appropriately.  HEENT: Pupils equal, extraocular movements intact  Respiratory: Patient's speak in full sentences and does not appear short of breath  Cardiovascular: No lower extremity edema, non tender, no erythema  MSK:  Back patient still has hypermobility noted.  Still having neck pain and seems to be more left greater than right.  Does have some tightness in the left parascapular region as well today.  Osteopathic findings  C2 flexed rotated and side bent right C6 flexed rotated and side bent left T3 extended rotated and side bent left inhaled rib T9 extended rotated and side bent left inhaled rib L2 flexed rotated and side bent right Sacrum right on right       Assessment and  Plan:  Hypermobility syndrome Continue hypermobility noted.  Still responding relatively well though to osteopathic manipulation.  Discussed posture and ergonomics otherwise.  Has started dancing which I do think is going to be more beneficial as well.  Increase activity otherwise follow-up again in 6 to 8 weeks    Nonallopathic problems  Decision today to treat with OMT was based on Physical Exam  After verbal consent patient was treated with HVLA, ME, FPR techniques in cervical, rib, thoracic, lumbar, and sacral  areas  Patient tolerated the procedure well with improvement in symptoms  Patient given exercises, stretches and lifestyle modifications  See medications in patient instructions if given  Patient will follow up in 4-8 weeks     The above documentation has been reviewed and is accurate and complete , DO         Note: This dictation was prepared with Dragon dictation along with smaller phrase technology. Any transcriptional errors that result from this process are unintentional.

## 2022-05-11 ENCOUNTER — Ambulatory Visit (INDEPENDENT_AMBULATORY_CARE_PROVIDER_SITE_OTHER): Payer: No Typology Code available for payment source | Admitting: Family Medicine

## 2022-05-11 VITALS — BP 118/84 | HR 98 | Ht 64.0 in | Wt 143.0 lb

## 2022-05-11 DIAGNOSIS — M9902 Segmental and somatic dysfunction of thoracic region: Secondary | ICD-10-CM

## 2022-05-11 DIAGNOSIS — M357 Hypermobility syndrome: Secondary | ICD-10-CM

## 2022-05-11 DIAGNOSIS — M9903 Segmental and somatic dysfunction of lumbar region: Secondary | ICD-10-CM | POA: Diagnosis not present

## 2022-05-11 DIAGNOSIS — M9908 Segmental and somatic dysfunction of rib cage: Secondary | ICD-10-CM

## 2022-05-11 DIAGNOSIS — M9901 Segmental and somatic dysfunction of cervical region: Secondary | ICD-10-CM

## 2022-05-11 DIAGNOSIS — M9904 Segmental and somatic dysfunction of sacral region: Secondary | ICD-10-CM | POA: Diagnosis not present

## 2022-05-11 NOTE — Assessment & Plan Note (Signed)
Continue hypermobility noted.  Still responding relatively well though to osteopathic manipulation.  Discussed posture and ergonomics otherwise.  Has started dancing which I do think is going to be more beneficial as well.  Increase activity otherwise follow-up again in 6 to 8 weeks

## 2022-05-11 NOTE — Patient Instructions (Signed)
Good to see you!  Do prescribed exercises at least 3x a week View at [email or web address]-exercise-code.com using code: [id number]   See you again in 7-8 weeks

## 2022-05-16 ENCOUNTER — Telehealth: Payer: No Typology Code available for payment source | Admitting: Physician Assistant

## 2022-05-16 ENCOUNTER — Other Ambulatory Visit (HOSPITAL_BASED_OUTPATIENT_CLINIC_OR_DEPARTMENT_OTHER): Payer: Self-pay

## 2022-05-16 DIAGNOSIS — J019 Acute sinusitis, unspecified: Secondary | ICD-10-CM

## 2022-05-16 DIAGNOSIS — B9689 Other specified bacterial agents as the cause of diseases classified elsewhere: Secondary | ICD-10-CM | POA: Diagnosis not present

## 2022-05-16 MED ORDER — AMOXICILLIN-POT CLAVULANATE 875-125 MG PO TABS
1.0000 | ORAL_TABLET | Freq: Two times a day (BID) | ORAL | 0 refills | Status: DC
  Filled 2022-05-16: qty 14, 7d supply, fill #0

## 2022-05-16 MED ORDER — BENZONATATE 100 MG PO CAPS
100.0000 mg | ORAL_CAPSULE | Freq: Three times a day (TID) | ORAL | 0 refills | Status: DC | PRN
  Filled 2022-05-16: qty 30, 10d supply, fill #0

## 2022-05-16 MED ORDER — PREDNISONE 20 MG PO TABS
40.0000 mg | ORAL_TABLET | Freq: Every day | ORAL | 0 refills | Status: DC
  Filled 2022-05-16: qty 10, 5d supply, fill #0

## 2022-05-16 NOTE — Progress Notes (Signed)
Virtual Visit Consent   Stacy [name], you are scheduled for a virtual visit with a [facility] provider today. Just as with appointments in the office, your consent must be obtained to participate. Your consent will be active for this visit and any virtual visit you may have with one of our providers in the next 365 days. If you have a MyChart account, a copy of this consent can be sent to you electronically.  As this is a virtual visit, video technology does not allow for your provider to perform a traditional examination. This may limit your provider's ability to fully assess your condition. If your provider identifies any concerns that need to be evaluated in person or the need to arrange testing (such as labs, EKG, etc.), we will make arrangements to do so. Although advances in technology are sophisticated, we cannot ensure that it will always work on either your end or our end. If the connection with a video visit is poor, the visit may have to be switched to a telephone visit. With either a video or telephone visit, we are not always able to ensure that we have a secure connection.  By engaging in this virtual visit, you consent to the provision of healthcare and authorize for your insurance to be billed (if applicable) for the services provided during this visit. Depending on your insurance coverage, you may receive a charge related to this service.  I need to obtain your verbal consent now. Are you willing to proceed with your visit today? Stacy [name] has provided verbal consent on 05/16/2022 for a virtual visit (video or telephone). Stacy [name], [place]  Date: 05/16/2022 5:35 PM  Virtual Visit via Video Note   I, Stacy [name], connected with  Stacy [name]  ([id number], [birth year]) on 05/16/22 at  5:30 PM EDT by a video-enabled telemedicine application and verified that I am speaking with the correct person using two identifiers.  Location: Patient: Virtual Visit Location  Patient: Home Provider: Virtual Visit Location Provider: Home Office   I discussed the limitations of evaluation and management by telemedicine and the availability of in person appointments. The patient expressed understanding and agreed to proceed.    History of Present Illness: Stacy [name] is a 31 y.o. who identifies as a female who was assigned female at birth, and is being seen today for possible sinusitis. Notes that started with symptoms last week, about 6 days or so ago with mild fatigue and head congestion. Thought was related to allergies but has progressed since onset. Now with increased sinus pressure, congestion, sore throat. As of Tuesday having increased cough that is dry but forceful, and maxillary sinus pain/tooth pain bilaterally. She has been taking her regular allergy medications without any improvement. Has taken two COVID tests, taken days apart that were both negative. No known COVID exposure.   HPI: HPI  Problems:  Patient Active Problem List   Diagnosis Date Noted   Situational anxiety 05/05/2022   Weight gain 05/05/2022   Allergic rhinitis 11/01/2021   Chest pain 04/09/2021   Rib pain on right side 04/05/2021   Family history of diabetes mellitus (DM) 04/22/2020   Anxiety and depression 07/06/2019   Difficulty sleeping 07/06/2019   RUQ pain 04/16/2019   Slipped rib syndrome 06/19/2018   Nonallopathic lesion of sacral region 06/19/2018   Nonallopathic lesion of rib cage 06/19/2018   Palpitations 02/11/2018   Hypermobility syndrome 03/25/2017   Nonallopathic lesion of thoracic region 03/25/2017   Nonallopathic lesion of  cervical region 03/25/2017   Nonallopathic lesion of lumbosacral region 03/25/2017   Back pain 01/30/2017   Other hyperlipidemia 01/23/2016   Dandruff 01/23/2016   Iron deficiency anemia 01/23/2016    Allergies:  Allergies  Allergen Reactions   Doxycycline Other (See Comments)    Decreased WBC, Neutropenia   Medications:  Current  Outpatient Medications:    amoxicillin-clavulanate (AUGMENTIN) 875-125 MG tablet, Take 1 tablet by mouth 2 (two) times daily., Disp: 14 tablet, Rfl: 0   benzonatate (TESSALON) 100 MG capsule, Take 1 capsule (100 mg total) by mouth 3 (three) times daily as needed for cough., Disp: 30 capsule, Rfl: 0   predniSONE (DELTASONE) 20 MG tablet, Take 2 tablets (40 mg total) by mouth daily with breakfast., Disp: 10 tablet, Rfl: 0   albuterol (VENTOLIN HFA) 108 (90 Base) MCG/ACT inhaler, Inhale 2 puffs into the lungs every 6 (six) hours as needed for wheezing or shortness of breath., Disp: 8 g, Rfl: 0   escitalopram (LEXAPRO) 10 MG tablet, Take 1 tablet (10 mg total) by mouth daily., Disp: 90 tablet, Rfl: 1   etonogestrel-ethinyl estradiol (NUVARING) 0.12-0.015 MG/24HR vaginal ring, INSERT 1 RING VAGINALLY ONCE EVERY 4 WEEKS; LEAVE IN FOR 3 WEEKS AND REMOVE FOR 1 WEEK OR AS DIRECTED, Disp: 3 each, Rfl: 4   Ferrous Gluconate 324 (37.5 Fe) MG TABS, , Disp: , Rfl:    fluticasone (FLONASE) 50 MCG/ACT nasal spray, Place 2 sprays into both nostrils daily., Disp: 16 g, Rfl: 5   ketoconazole (NIZORAL) 2 % shampoo, Apply 1 application topically 2 (two) times a week., Disp: 120 mL, Rfl: 5   levocetirizine (XYZAL) 5 MG tablet, Take 1 tablet (5 mg total) by mouth every evening., Disp: 30 tablet, Rfl: 5   LORazepam (ATIVAN) 0.5 MG tablet, TAKE 1 TABLET BY MOUTH AT BEDTIME AS NEEDED FOR ANXIETY OR SLEEP, Disp: 30 tablet, Rfl: 0   Olopatadine HCl 0.2 % SOLN, Place one drop per eye once daily as needed for itchy/watery eyes, Disp: 2.5 mL, Rfl: 2   Olopatadine-Mometasone (RYALTRIS) 665-25 MCG/ACT SUSP, Place 2 sprays into the nose daily in the afternoon. 1-2 times daily, Disp: 29 g, Rfl: 2   propranolol (INDERAL) 10 MG tablet, TAKE 1 TABLET BY MOUTH DAILY AS NEEDED (PRESENTATIONS). (Patient not taking: Reported on 12/14/2021), Disp: 30 tablet, Rfl: 0   tiZANidine (ZANAFLEX) 4 MG tablet, Take 1 tablet (4 mg total) by mouth at  bedtime., Disp: 30 tablet, Rfl: 0   vitamin C (ASCORBIC ACID) 500 MG tablet, , Disp: , Rfl:   Observations/Objective: Patient is well-developed, well-nourished in no acute distress.  Resting comfortably at home.  Head is normocephalic, atraumatic.  No labored breathing. Speech is clear and coherent with logical content.  Patient is alert and oriented at baseline.   Assessment and Plan: 1. Acute bacterial sinusitis - amoxicillin-clavulanate (AUGMENTIN) 875-125 MG tablet; Take 1 tablet by mouth 2 (two) times daily.  Dispense: 14 tablet; Refill: 0 - benzonatate (TESSALON) 100 MG capsule; Take 1 capsule (100 mg total) by mouth 3 (three) times daily as needed for cough.  Dispense: 30 capsule; Refill: 0 - predniSONE (DELTASONE) 20 MG tablet; Take 2 tablets (40 mg total) by mouth daily with breakfast.  Dispense: 10 tablet; Refill: 0  Rx Augmentin.  Increase fluids.  Rest.  Saline nasal spray.  Probiotic.  Mucinex as directed.  Humidifier in bedroom. Tessalon and Prednisone burst per orders.  Call or return to clinic if symptoms are not improving.   Follow  Up Instructions: I discussed the assessment and treatment plan with the patient. The patient was provided an opportunity to ask questions and all were answered. The patient agreed with the plan and demonstrated an understanding of the instructions.  A copy of instructions were sent to the patient via MyChart unless otherwise noted below.   The patient was advised to call back or seek an in-person evaluation if the symptoms worsen or if the condition fails to improve as anticipated.  Time:  I spent 10 minutes with the patient via telehealth technology discussing the above problems/concerns.    Leeanne Rio, PA-C

## 2022-05-16 NOTE — Patient Instructions (Signed)
Stacy [name], thank you for joining [name], PA-C for today's virtual visit.  While this provider is not your primary care provider (PCP), if your PCP is located in our provider database this encounter information will be shared with them immediately following your visit.  Consent: (Patient) Stacy [name] provided verbal consent for this virtual visit at the beginning of the encounter.  Current Medications:  Current Outpatient Medications:    albuterol (VENTOLIN HFA) 108 (90 Base) MCG/ACT inhaler, Inhale 2 puffs into the lungs every 6 (six) hours as needed for wheezing or shortness of breath., Disp: 8 g, Rfl: 0   escitalopram (LEXAPRO) 10 MG tablet, Take 1 tablet (10 mg total) by mouth daily., Disp: 90 tablet, Rfl: 1   etonogestrel-ethinyl estradiol (NUVARING) 0.12-0.015 MG/24HR vaginal ring, INSERT 1 RING VAGINALLY ONCE EVERY 4 WEEKS; LEAVE IN FOR 3 WEEKS AND REMOVE FOR 1 WEEK OR AS DIRECTED, Disp: 3 each, Rfl: 4   Ferrous Gluconate 324 (37.5 Fe) MG TABS, , Disp: , Rfl:    fluticasone (FLONASE) 50 MCG/ACT nasal spray, Place 2 sprays into both nostrils daily., Disp: 16 g, Rfl: 5   ketoconazole (NIZORAL) 2 % shampoo, Apply 1 application topically 2 (two) times a week., Disp: 120 mL, Rfl: 5   levocetirizine (XYZAL) 5 MG tablet, Take 1 tablet (5 mg total) by mouth every evening., Disp: 30 tablet, Rfl: 5   LORazepam (ATIVAN) 0.5 MG tablet, TAKE 1 TABLET BY MOUTH AT BEDTIME AS NEEDED FOR ANXIETY OR SLEEP, Disp: 30 tablet, Rfl: 0   Olopatadine HCl 0.2 % SOLN, Place one drop per eye once daily as needed for itchy/watery eyes, Disp: 2.5 mL, Rfl: 2   Olopatadine-Mometasone (RYALTRIS) 665-25 MCG/ACT SUSP, Place 2 sprays into the nose daily in the afternoon. 1-2 times daily, Disp: 29 g, Rfl: 2   propranolol (INDERAL) 10 MG tablet, TAKE 1 TABLET BY MOUTH DAILY AS NEEDED (PRESENTATIONS). (Patient not taking: Reported on 12/14/2021), Disp: 30 tablet, Rfl: 0   tiZANidine (ZANAFLEX) 4 MG tablet,  Take 1 tablet (4 mg total) by mouth at bedtime., Disp: 30 tablet, Rfl: 0   vitamin C (ASCORBIC ACID) 500 MG tablet, , Disp: , Rfl:    Medications ordered in this encounter:  No orders of the defined types were placed in this encounter.    *If you need refills on other medications prior to your next appointment, please contact your pharmacy*  Follow-Up: Call back or seek an in-person evaluation if the symptoms worsen or if the condition fails to improve as anticipated.  Other Instructions Please take antibiotic as directed.  Increase fluid intake.  Use Saline nasal spray.  Take a daily multivitamin. Use the Tessalon and Prednisone as directed. Continue your regular allergy medications.  Place a humidifier in the bedroom.  Please call or return clinic if symptoms are not improving.  Sinusitis Sinusitis is redness, soreness, and swelling (inflammation) of the paranasal sinuses. Paranasal sinuses are air pockets within the bones of your face (beneath the eyes, the middle of the forehead, or above the eyes). In healthy paranasal sinuses, mucus is able to drain out, and air is able to circulate through them by way of your nose. However, when your paranasal sinuses are inflamed, mucus and air can become trapped. This can allow bacteria and other germs to grow and cause infection. Sinusitis can develop quickly and last only a short time (acute) or continue over a long period (chronic). Sinusitis that lasts for more than 12 weeks is considered  chronic.  CAUSES  Causes of sinusitis include: Allergies. Structural abnormalities, such as displacement of the cartilage that separates your nostrils (deviated septum), which can decrease the air flow through your nose and sinuses and affect sinus drainage. Functional abnormalities, such as when the small hairs (cilia) that line your sinuses and help remove mucus do not work properly or are not present. SYMPTOMS  Symptoms of acute and chronic sinusitis are the  same. The primary symptoms are pain and pressure around the affected sinuses. Other symptoms include: Upper toothache. Earache. Headache. Bad breath. Decreased sense of smell and taste. A cough, which worsens when you are lying flat. Fatigue. Fever. Thick drainage from your nose, which often is green and may contain pus (purulent). Swelling and warmth over the affected sinuses. DIAGNOSIS  Your caregiver will perform a physical exam. During the exam, your caregiver may: Look in your nose for signs of abnormal growths in your nostrils (nasal polyps). Tap over the affected sinus to check for signs of infection. View the inside of your sinuses (endoscopy) with a special imaging device with a light attached (endoscope), which is inserted into your sinuses. If your caregiver suspects that you have chronic sinusitis, one or more of the following tests may be recommended: Allergy tests. Nasal culture A sample of mucus is taken from your nose and sent to a lab and screened for bacteria. Nasal cytology A sample of mucus is taken from your nose and examined by your caregiver to determine if your sinusitis is related to an allergy. TREATMENT  Most cases of acute sinusitis are related to a viral infection and will resolve on their own within 10 days. Sometimes medicines are prescribed to help relieve symptoms (pain medicine, decongestants, nasal steroid sprays, or saline sprays).  However, for sinusitis related to a bacterial infection, your caregiver will prescribe antibiotic medicines. These are medicines that will help kill the bacteria causing the infection.  Rarely, sinusitis is caused by a fungal infection. In theses cases, your caregiver will prescribe antifungal medicine. For some cases of chronic sinusitis, surgery is needed. Generally, these are cases in which sinusitis recurs more than 3 times per year, despite other treatments. HOME CARE INSTRUCTIONS  Drink plenty of water. Water helps thin  the mucus so your sinuses can drain more easily. Use a humidifier. Inhale steam 3 to 4 times a day (for example, sit in the bathroom with the shower running). Apply a warm, moist washcloth to your face 3 to 4 times a day, or as directed by your caregiver. Use saline nasal sprays to help moisten and clean your sinuses. Take over-the-counter or prescription medicines for pain, discomfort, or fever only as directed by your caregiver. SEEK IMMEDIATE MEDICAL CARE IF: You have increasing pain or severe headaches. You have nausea, vomiting, or drowsiness. You have swelling around your face. You have vision problems. You have a stiff neck. You have difficulty breathing. MAKE SURE YOU:  Understand these instructions. Will watch your condition. Will get help right away if you are not doing well or get worse. Document Released: 10/01/2005 Document Revised: 12/24/2011 Document Reviewed: 10/16/2011 [facility] Patient Information 2014 [place], [place].    If you have been instructed to have an in-person evaluation today at a local Urgent Care facility, please use the link below. It will take you to a list of all of our available [facility], including address, phone number and hours of operation. Please do not delay care.  [facility]  If  you or a family member do not have a primary care provider, use the link below to schedule a visit and establish care. When you choose a [facility] primary care physician or advanced practice provider, you gain a long-term partner in health. Find a Primary Care Provider  Learn more about [facility] in-office and virtual care options: [facility]

## 2022-05-23 ENCOUNTER — Other Ambulatory Visit: Payer: Self-pay

## 2022-06-08 ENCOUNTER — Other Ambulatory Visit (INDEPENDENT_AMBULATORY_CARE_PROVIDER_SITE_OTHER): Payer: No Typology Code available for payment source

## 2022-06-08 DIAGNOSIS — F32A Depression, unspecified: Secondary | ICD-10-CM

## 2022-06-08 DIAGNOSIS — R635 Abnormal weight gain: Secondary | ICD-10-CM | POA: Diagnosis not present

## 2022-06-08 DIAGNOSIS — F419 Anxiety disorder, unspecified: Secondary | ICD-10-CM

## 2022-06-08 DIAGNOSIS — E7849 Other hyperlipidemia: Secondary | ICD-10-CM

## 2022-06-08 LAB — COMPREHENSIVE METABOLIC PANEL
ALT: 7 U/L (ref 0–35)
AST: 13 U/L (ref 0–37)
Albumin: 4.1 g/dL (ref 3.5–5.2)
Alkaline Phosphatase: 33 U/L — ABNORMAL LOW (ref 39–117)
BUN: 9 mg/dL (ref 6–23)
CO2: 27 mEq/L (ref 19–32)
Calcium: 9.5 mg/dL (ref 8.4–10.5)
Chloride: 103 mEq/L (ref 96–112)
Creatinine, Ser: 0.8 mg/dL (ref 0.40–1.20)
GFR: 98.37 mL/min (ref >60.00)
Glucose, Bld: 85 mg/dL (ref 70–99)
Potassium: 4 mEq/L (ref 3.5–5.1)
Sodium: 137 mEq/L (ref 135–145)
Total Bilirubin: 0.4 mg/dL (ref 0.2–1.2)
Total Protein: 7.5 g/dL (ref 6.0–8.3)

## 2022-06-08 LAB — LIPID PANEL
Cholesterol: 223 mg/dL — ABNORMAL HIGH (ref 0–200)
HDL: 81.9 mg/dL (ref >39.00)
LDL Cholesterol: 118 mg/dL — ABNORMAL HIGH (ref 0–99)
NonHDL: 140.64
Total CHOL/HDL Ratio: 3
Triglycerides: 113 mg/dL (ref 0.0–149.0)
VLDL: 22.6 mg/dL (ref 0.0–40.0)

## 2022-06-08 LAB — TSH: TSH: 1.81 u[IU]/mL (ref 0.35–5.50)

## 2022-06-26 NOTE — Progress Notes (Signed)
[name] [address] [place] [address] Phone: [phone] Subjective:   Stacy [name], am serving as a scribe for Dr. Antoine Primas. I'm seeing this patient by the request  of:  Pincus Sanes, MD  CC: back and neck pain follow up   [id number]  Stacy [name] is a 31 y.o. female coming in with complaint of back and neck pain. OMT on 05/11/2022. Patient states that she has a pinch in R lumbar spine for past 2 days. Had some neck pain since last visit but that has improved.   Patient is concerned because over the course of time she has gained 20 pounds.  Does not know why that could be potentially.  Patient states that her diet has not changed significantly.  Continues to work out fairly regularly.  Medications patient has been prescribed: None  Taking:         Reviewed prior external information including notes and imaging from previsou exam, outside providers and external EMR if available.   As well as notes that were available from care everywhere and other healthcare systems.  Past medical history, social, surgical and family history all reviewed in electronic medical record.  No pertanent information unless stated regarding to the chief complaint.   Past Medical History:  Diagnosis Date   Anemia    Hyperlipidemia     Allergies  Allergen Reactions   Doxycycline Other (See Comments)    Decreased WBC, Neutropenia     Review of Systems:  No headache, visual changes, nausea, vomiting, diarrhea, constipation, dizziness, abdominal pain, skin rash, fevers, chills, night sweats, weight loss, swollen lymph nodes, body aches, joint swelling, chest pain, shortness of breath, mood changes. POSITIVE muscle aches  Objective  Blood pressure 116/82, pulse (!) 105, height 5\' 4"  (1.626 m), weight 147 lb (66.7 kg), SpO2 96 %.   General: No apparent distress alert and oriented x3 mood and affect normal, dressed appropriately.  HEENT:  Pupils equal, extraocular movements intact  Respiratory: Patient's speak in full sentences and does not appear short of breath  Cardiovascular: No lower extremity edema, non tender, no erythema  MSK: hypermobility noted.  Back does have some mild loss of lordosis but overall doing relatively well.  More tightness in the thoracolumbar juncture.  Seems to be left greater than right.  Osteopathic findings  C2 flexed rotated and side bent right C3 flexed rotated and side bent left C6 flexed rotated and side bent left T6 extended rotated and side bent left inhaled rib L2 flexed rotated and side bent right Sacrum right on right       Assessment and Plan:  Slipped rib syndrome Continues to have some difficulty but overall seems to be relatively stable.  Does need manipulation every 6 to 8 weeks this seems to help.  Hold on any other medication changes at the moment follow-up again in 6 to 8 weeks  Weight gain Patient continues to state that she is having weight gain.  Did have labs by primary care provider showing that her thyroid was unremarkable.  Still could be potentially hormonal or stress related.  Will refer to medical management with this affecting patient's psyche.  I do believe that patient has put on muscle to a certain degree and advance body compensation calculations could be beneficial for this individual.  No other laboratory work-up at this moment but can consider such things as cortisol if necessary.    Nonallopathic problems  Decision today to  treat with OMT was based on Physical Exam  After verbal consent patient was treated with HVLA, ME, FPR techniques in cervical, rib, thoracic, lumbar, and sacral  areas  Patient tolerated the procedure well with improvement in symptoms  Patient given exercises, stretches and lifestyle modifications  See medications in patient instructions if given  Patient will follow up in 4-8 weeks     The above documentation has been  reviewed and is accurate and complete Judi Saa, DO         Note: This dictation was prepared with Dragon dictation along with smaller phrase technology. Any transcriptional errors that result from this process are unintentional.

## 2022-06-27 ENCOUNTER — Ambulatory Visit (INDEPENDENT_AMBULATORY_CARE_PROVIDER_SITE_OTHER): Payer: No Typology Code available for payment source | Admitting: Family Medicine

## 2022-06-27 VITALS — BP 116/82 | HR 105 | Ht 64.0 in | Wt 147.0 lb

## 2022-06-27 DIAGNOSIS — M9902 Segmental and somatic dysfunction of thoracic region: Secondary | ICD-10-CM

## 2022-06-27 DIAGNOSIS — M9908 Segmental and somatic dysfunction of rib cage: Secondary | ICD-10-CM

## 2022-06-27 DIAGNOSIS — R635 Abnormal weight gain: Secondary | ICD-10-CM | POA: Diagnosis not present

## 2022-06-27 DIAGNOSIS — M9903 Segmental and somatic dysfunction of lumbar region: Secondary | ICD-10-CM

## 2022-06-27 DIAGNOSIS — M9904 Segmental and somatic dysfunction of sacral region: Secondary | ICD-10-CM

## 2022-06-27 DIAGNOSIS — E663 Overweight: Secondary | ICD-10-CM

## 2022-06-27 DIAGNOSIS — M9901 Segmental and somatic dysfunction of cervical region: Secondary | ICD-10-CM | POA: Diagnosis not present

## 2022-06-27 DIAGNOSIS — M940 Chondrocostal junction syndrome [Tietze]: Secondary | ICD-10-CM

## 2022-06-27 NOTE — Patient Instructions (Signed)
Great to see you Referral to [facility] See me in 6-8 weeks

## 2022-06-28 NOTE — Assessment & Plan Note (Signed)
Continues to have some difficulty but overall seems to be relatively stable.  Does need manipulation every 6 to 8 weeks this seems to help.  Hold on any other medication changes at the moment follow-up again in 6 to 8 weeks

## 2022-06-28 NOTE — Assessment & Plan Note (Signed)
Patient continues to state that she is having weight gain.  Did have labs by primary care provider showing that her thyroid was unremarkable.  Still could be potentially hormonal or stress related.  Will refer to medical management with this affecting patient's psyche.  I do believe that patient has put on muscle to a certain degree and advance body compensation calculations could be beneficial for this individual.  No other laboratory work-up at this moment but can consider such things as cortisol if necessary.

## 2022-07-09 ENCOUNTER — Encounter: Payer: Self-pay | Admitting: Internal Medicine

## 2022-07-10 ENCOUNTER — Other Ambulatory Visit: Payer: Self-pay

## 2022-07-10 MED ORDER — KETOCONAZOLE 2 % EX SHAM
1.0000 | MEDICATED_SHAMPOO | CUTANEOUS | 5 refills | Status: DC
  Filled 2022-07-10: qty 120, 84d supply, fill #0
  Filled 2023-03-28: qty 120, 84d supply, fill #1
  Filled 2023-05-27 – 2023-05-28 (×2): qty 120, 84d supply, fill #2

## 2022-07-30 ENCOUNTER — Other Ambulatory Visit: Payer: Self-pay | Admitting: Internal Medicine

## 2022-07-31 ENCOUNTER — Other Ambulatory Visit: Payer: Self-pay

## 2022-07-31 MED ORDER — PROPRANOLOL HCL 10 MG PO TABS
ORAL_TABLET | ORAL | 0 refills | Status: DC
  Filled 2022-07-31: qty 30, 30d supply, fill #0

## 2022-08-01 ENCOUNTER — Other Ambulatory Visit: Payer: Self-pay

## 2022-08-01 MED ORDER — LORAZEPAM 0.5 MG PO TABS
ORAL_TABLET | ORAL | 0 refills | Status: DC
  Filled 2022-08-01: qty 30, 30d supply, fill #0

## 2022-08-21 NOTE — Progress Notes (Unsigned)
[name] [place] Phone: [phone] Subjective:    I'm seeing this patient by the request  of:  Binnie Rail, MD  CC: Neck and back pain follow-up  [id number]  Stacy [name] is a 31 y.o. female coming in with complaint of back and neck pain. OMT 06/27/2022. Patient states routine OMT.   Medications patient has been prescribed: Zanaflex  Taking: Zanaflex          Reviewed prior external information including notes and imaging from previsou exam, outside providers and external EMR if available.   As well as notes that were available from care everywhere and other healthcare systems.  Past medical history, social, surgical and family history all reviewed in electronic medical record.  No pertanent information unless stated regarding to the chief complaint.   Past Medical History:  Diagnosis Date   Anemia    Hyperlipidemia     Allergies  Allergen Reactions   Doxycycline Other (See Comments)    Decreased WBC, Neutropenia     Review of Systems:  No headache, visual changes, nausea, vomiting, diarrhea, constipation, dizziness, abdominal pain, skin rash, fevers, chills, night sweats, weight loss, swollen lymph nodes, body aches, joint swelling, chest pain, shortness of breath, mood changes. POSITIVE muscle aches  Objective  Blood pressure 118/62, pulse 91, height 5\' 4"  (1.626 m), weight 149 lb (67.6 kg), SpO2 99 %.   General: No apparent distress alert and oriented x3 mood and affect normal, dressed appropriately.  HEENT: Pupils equal, extraocular movements intact  Respiratory: Patient's speak in full sentences and does not appear short of breath  Cardiovascular: No lower extremity edema, non tender, no erythema  Gait MSK:  Back hypermobility noted with some tightness noted in the parascapular region right greater than left.  Neck exam does have tightness more in the occipital region on the left side  than the right side today.  Negative Spurling's though.  5 out of 5 strength of the upper extremities  Osteopathic findings  C4 flexed rotated and side bent right C6 flexed rotated and side bent left T3 extended rotated and side bent right inhaled rib T8 extended rotated and side bent left L2 flexed rotated and side bent right Sacrum right on right      Assessment and Plan:  Slipped rib syndrome Discussed HEP  Discussed which activities  Discussed which activities to do and which ones to avoid.  Patient is still concerned with the possibility With patient having some weight gain.  Discussed the potential for seeing a weight and wellness specialist but at the moment would like to wait till potentially next year.  Hypermobility syndrome Continues with the hypermobility.  We will continue to monitor.  Follow-up again in 6 to 8 weeks    Nonallopathic problems  Decision today to treat with OMT was based on Physical Exam  After verbal consent patient was treated with HVLA, ME, FPR techniques in cervical, rib, thoracic, lumbar, and sacral  areas  Patient tolerated the procedure well with improvement in symptoms  Patient given exercises, stretches and lifestyle modifications  See medications in patient instructions if given  Patient will follow up in 4-8 weeks     The above documentation has been reviewed and is accurate and complete [name], DO         Note: This dictation was prepared with Dragon dictation along with smaller phrase technology. Any transcriptional errors that result from this process are unintentional.

## 2022-08-22 ENCOUNTER — Other Ambulatory Visit: Payer: Self-pay

## 2022-08-22 ENCOUNTER — Ambulatory Visit (INDEPENDENT_AMBULATORY_CARE_PROVIDER_SITE_OTHER): Payer: No Typology Code available for payment source | Admitting: Family Medicine

## 2022-08-22 ENCOUNTER — Encounter: Payer: Self-pay | Admitting: Family Medicine

## 2022-08-22 VITALS — BP 118/62 | HR 91 | Ht 64.0 in | Wt 149.0 lb

## 2022-08-22 DIAGNOSIS — M357 Hypermobility syndrome: Secondary | ICD-10-CM | POA: Diagnosis not present

## 2022-08-22 DIAGNOSIS — M9904 Segmental and somatic dysfunction of sacral region: Secondary | ICD-10-CM

## 2022-08-22 DIAGNOSIS — M940 Chondrocostal junction syndrome [Tietze]: Secondary | ICD-10-CM

## 2022-08-22 DIAGNOSIS — M9908 Segmental and somatic dysfunction of rib cage: Secondary | ICD-10-CM

## 2022-08-22 DIAGNOSIS — M9902 Segmental and somatic dysfunction of thoracic region: Secondary | ICD-10-CM

## 2022-08-22 DIAGNOSIS — M9901 Segmental and somatic dysfunction of cervical region: Secondary | ICD-10-CM

## 2022-08-22 DIAGNOSIS — M9903 Segmental and somatic dysfunction of lumbar region: Secondary | ICD-10-CM

## 2022-08-22 MED ORDER — SCOPOLAMINE 1 MG/3DAYS TD PT72
1.0000 | MEDICATED_PATCH | TRANSDERMAL | 0 refills | Status: DC
  Filled 2022-08-22: qty 10, 30d supply, fill #0

## 2022-08-22 NOTE — Assessment & Plan Note (Signed)
Continues with the hypermobility.  We will continue to monitor.  Follow-up again in 6 to 8 weeks

## 2022-08-22 NOTE — Patient Instructions (Signed)
Good to see you Have a great trip  I want to see picture Sending in a scopolamine patch do a half patch Follow 4-6 weeks

## 2022-08-22 NOTE — Assessment & Plan Note (Addendum)
Discussed HEP  Discussed which activities  Discussed which activities to do and which ones to avoid.  Patient is still concerned with the possibility With patient having some weight gain.  Discussed the potential for seeing a weight and wellness specialist but at the moment would like to wait till potentially next year.

## 2022-08-23 ENCOUNTER — Other Ambulatory Visit: Payer: Self-pay

## 2022-08-24 ENCOUNTER — Other Ambulatory Visit: Payer: Self-pay

## 2022-10-01 NOTE — Progress Notes (Unsigned)
[name] [address] [place] [address] Phone: [phone] Subjective:   Stacy [name], am serving as a scribe for Dr. Antoine Primas. I'm seeing this patient by the request  of:  Pincus Sanes, MD  CC: Hypermobility syndrome  , Back pain, new ankle pain  SEG:[id number]  Stacy [name] is a 31 y.o. female coming in with complaint of back and neck pain. OMT 08/22/2022. Patient states she was on vacation and she was walking a lot and she can tell she did something to her achilles tendon.  States that it is tender to palpation.  Sometimes can affect it while [place] was [place].  Medications patient has been prescribed: None  Taking:         Reviewed prior external information including notes and imaging from previsou exam, outside providers and external EMR if available.   As well as notes that were available from care everywhere and other healthcare systems.  Past medical history, social, surgical and family history all reviewed in electronic medical record.  No pertanent information unless stated regarding to the chief complaint.   Past Medical History:  Diagnosis Date   Anemia    Hyperlipidemia     Allergies  Allergen Reactions   Doxycycline Other (See Comments)    Decreased WBC, Neutropenia     Review of Systems:  No headache, visual changes, nausea, vomiting, diarrhea, constipation, dizziness, abdominal pain, skin rash, fevers, chills, night sweats, weight loss, swollen lymph nodes, body aches, joint swelling, chest pain, shortness of breath, mood changes. POSITIVE muscle aches  Objective  Blood pressure 122/84, pulse (!) 106, height 5\' 4"  (1.626 m), weight 145 lb (65.8 kg), SpO2 98 %.   General: No apparent distress alert and oriented x3 mood and affect normal, dressed appropriately.  HEENT: Pupils equal, extraocular movements intact  Respiratory: Patient's speak in full sentences and does not appear short of  breath  Cardiovascular: No lower extremity edema, non tender, no erythema  Low back exam does have some loss lordosis.  Tightness in the parascapular region noted in the right greater than left. Ankle exam does have good range of motion noted. Mildly tender on the lower left cutaneous approximately 2 cm from the insertion.  MSK performed of: ankle This study was ordered, performed, and interpreted by [place] D.O.  Foot/Ankle:   Achilles tendon visualized along length of tendon and unremarkable on long and transverse views without sheath effusion.   IMPRESSION:  NORMAL ULTRASONOGRAPHIC EXAMINATION OF THE FOOT/ANKLE.    Osteopathic findings  C2 flexed rotated and side bent right C7 flexed rotated and side bent left T3 extended rotated and side bent right inhaled rib T7 extended and rotated and side bent left T9 extended rotated and side bent left L2 flexed rotated and side bent right Sacrum right on right    Assessment and Plan:  Slipped rib syndrome Chronic, with mild exacerbation with patient traveling more recently.  Discussed with patient about icing regimen and home exercises.  Patient should do relatively well overall.  Will get back into routine.  Follow-up with me again in 6 to 8 weeks  Hypermobility syndrome Contributes to some of the discomfort and pain as well.  Follow-up again in 6 to 8 weeks continue work on strengthening exercises  Heel pain Patient has no tenderness in the Achilles but I do not see anything on findings on the ultrasound noted today.  Discussed with patient regarding her home exercises and  icing regimen.  Patient has a heel lift in.  Should do well with conservative therapy    Nonallopathic problems  Decision today to treat with OMT was based on Physical Exam  After verbal consent patient was treated with HVLA, ME, FPR techniques in cervical, rib, thoracic, lumbar, and sacral  areas  Patient tolerated the procedure well with improvement  in symptoms  Patient given exercises, stretches and lifestyle modifications  See medications in patient instructions if given  Patient will follow up in 4-8 weeks     The above documentation has been reviewed and is accurate and complete [name], DO         Note: This dictation was prepared with Dragon dictation along with smaller phrase technology. Any transcriptional errors that result from this process are unintentional.

## 2022-10-03 ENCOUNTER — Ambulatory Visit: Payer: Self-pay

## 2022-10-03 ENCOUNTER — Ambulatory Visit (INDEPENDENT_AMBULATORY_CARE_PROVIDER_SITE_OTHER): Payer: No Typology Code available for payment source | Admitting: Family Medicine

## 2022-10-03 VITALS — BP 122/84 | HR 106 | Ht 64.0 in | Wt 145.0 lb

## 2022-10-03 DIAGNOSIS — M25572 Pain in left ankle and joints of left foot: Secondary | ICD-10-CM

## 2022-10-03 DIAGNOSIS — M9903 Segmental and somatic dysfunction of lumbar region: Secondary | ICD-10-CM | POA: Diagnosis not present

## 2022-10-03 DIAGNOSIS — M940 Chondrocostal junction syndrome [Tietze]: Secondary | ICD-10-CM | POA: Diagnosis not present

## 2022-10-03 DIAGNOSIS — M9908 Segmental and somatic dysfunction of rib cage: Secondary | ICD-10-CM

## 2022-10-03 DIAGNOSIS — M9901 Segmental and somatic dysfunction of cervical region: Secondary | ICD-10-CM

## 2022-10-03 DIAGNOSIS — M357 Hypermobility syndrome: Secondary | ICD-10-CM | POA: Diagnosis not present

## 2022-10-03 DIAGNOSIS — M79673 Pain in unspecified foot: Secondary | ICD-10-CM | POA: Diagnosis not present

## 2022-10-03 DIAGNOSIS — M9902 Segmental and somatic dysfunction of thoracic region: Secondary | ICD-10-CM

## 2022-10-03 DIAGNOSIS — M9904 Segmental and somatic dysfunction of sacral region: Secondary | ICD-10-CM | POA: Diagnosis not present

## 2022-10-03 NOTE — Assessment & Plan Note (Signed)
Patient has no tenderness in the Achilles but I do not see anything on findings on the ultrasound noted today.  Discussed with patient regarding her home exercises and icing regimen.  Patient has a heel lift in.  Should do well with conservative therapy

## 2022-10-03 NOTE — Assessment & Plan Note (Signed)
Contributes to some of the discomfort and pain as well.  Follow-up again in 6 to 8 weeks continue work on strengthening exercises

## 2022-10-03 NOTE — Assessment & Plan Note (Signed)
Chronic, with mild exacerbation with patient traveling more recently.  Discussed with patient about icing regimen and home exercises.  Patient should do relatively well overall.  Will get back into routine.  Follow-up with me again in 6 to 8 weeks

## 2022-10-03 NOTE — Patient Instructions (Signed)
Good to see you See me again in 6-8 weeks 

## 2022-11-07 ENCOUNTER — Encounter: Payer: No Typology Code available for payment source | Admitting: Internal Medicine

## 2022-11-09 ENCOUNTER — Ambulatory Visit: Payer: No Typology Code available for payment source | Admitting: Allergy

## 2022-11-14 ENCOUNTER — Other Ambulatory Visit (HOSPITAL_BASED_OUTPATIENT_CLINIC_OR_DEPARTMENT_OTHER): Payer: Self-pay

## 2022-11-14 ENCOUNTER — Other Ambulatory Visit: Payer: Self-pay

## 2022-11-14 ENCOUNTER — Encounter: Payer: Self-pay | Admitting: Allergy

## 2022-11-14 ENCOUNTER — Ambulatory Visit (INDEPENDENT_AMBULATORY_CARE_PROVIDER_SITE_OTHER): Payer: Commercial Managed Care - PPO | Admitting: Allergy

## 2022-11-14 DIAGNOSIS — H1013 Acute atopic conjunctivitis, bilateral: Secondary | ICD-10-CM | POA: Diagnosis not present

## 2022-11-14 DIAGNOSIS — U071 COVID-19: Secondary | ICD-10-CM

## 2022-11-14 DIAGNOSIS — J3089 Other allergic rhinitis: Secondary | ICD-10-CM | POA: Diagnosis not present

## 2022-11-14 DIAGNOSIS — J4520 Mild intermittent asthma, uncomplicated: Secondary | ICD-10-CM

## 2022-11-14 MED ORDER — ALBUTEROL SULFATE HFA 108 (90 BASE) MCG/ACT IN AERS
2.0000 | INHALATION_SPRAY | Freq: Four times a day (QID) | RESPIRATORY_TRACT | 1 refills | Status: DC | PRN
  Filled 2022-11-14: qty 6.7, 17d supply, fill #0

## 2022-11-14 MED ORDER — PREDNISONE 10 MG PO TABS
20.0000 mg | ORAL_TABLET | Freq: Two times a day (BID) | ORAL | 0 refills | Status: AC
  Filled 2022-11-14: qty 20, 5d supply, fill #0

## 2022-11-14 NOTE — Progress Notes (Signed)
RE: Stacy [name] MRN: [id number] DOB: August 03, 1991 Date of Telemedicine Visit: 11/14/2022  Referring provider: Binnie Rail, MD Primary care provider: Binnie Rail, MD  Chief Complaint: Follow-up, Cough, Nasal Congestion, Fever, and Shortness of Breath   Telemedicine Follow Up Visit via Telephone: I connected with Stacy [name] for a follow up on 11/16/22 by telephone and verified that I am speaking with the correct person using two identifiers.   I discussed the limitations, risks, security and privacy concerns of performing an evaluation and management service by telephone and the availability of in person appointments. I also discussed with the patient that there may be a patient responsible charge related to this service. The patient expressed understanding and agreed to proceed.  Patient is at home. Provider is at the office.  Visit start time: 3;50pm Visit end time: 4;09pm Insurance consent/check in by: front desk staff Medical consent and medical assistant/nurse: Ashleigh  History of Present Illness: She is a 32 y.o. female, who is being followed for allergic rhinitis with conjunctivitis and reactive airways.. Her previous allergy office visit was on 05/04/2022 with Dr. Nelva Bush.   She started having symptoms on Monday this week.  Started with sore throat due to drainage then developed coughing, fevers (highest 100.8), achy joints, teeth pain, headaches, sinus pressure.  She started taking decongestant like sudafed yesterday.  For fever she has been rotating between tylenol and ibuprofen.  She tested positive for Covid yesterday.  She would like to know what she can do to help prevent her respiratory symptoms from worsening.  She did get her COVID booster on 10/23.  She states her current albuterol inhaler is expired.  She states when she had COVID previously she did do a course of Paxlovid however she does not feel that it change the course of her illness. With her allergic  rhinitis and conjunctivitis she is currently doing Zyrtec as she does rotate between that and Xyzal typically every 6 months or so.  She also has access to nasal sprays Ryaltris as well as eyedrop Pataday.  Assessment and Plan: Berklee is a 32 y.o. female with:   Upper respiratory illness -COVID-positive -Discussed options of the antiviral COVID medications however with previous COVID illness did not seem that the antiviral changed the course of her illness.  We opted to not proceed with this option. -For symptom control I did discuss a prednisone burst to help with her respiratory and sinus symptoms.  Recommend prednisone 20 mg twice a day for the next 5 days -Refilled her albuterol inhaler for as needed use -Recommended she continue adequate hydration and get rest -She can continue use of her Sudafed medications as needed for sinus pressure and pain relief -She can continue alternating between Tylenol and ibuprofen for fever control  Allergic rhinitis with conjunctivitis - Continue avoidance measures grasses, weeds, and trees. - Continue with: Xyzal daily.  Every 6 months can rotate between [place].  Ryaltris (olopatadine/mometasone) two sprays per nostril 1-2 times daily as needed Pataday (olopatadine) one drop per eye once daily as needed for itchy/watery eyes - You can use an extra dose of the antihistamine, if needed, for breakthrough symptoms.  - Consider nasal saline rinses 1-2 times daily to remove allergens from the nasal cavities as well as help with mucous clearance (this is especially helpful to do before the nasal sprays are given) - Consider allergy shots as a means of long-term control if medication management is not effective.  Discussed our 2 options  for starting shots: RUSH vs traditional.   Informational packet provided.  - Allergy shots "re-train" and "reset" the immune system to ignore environmental allergens and decrease the resulting immune response to those  allergens (sneezing, itchy watery eyes, runny nose, nasal congestion, etc).    - Allergy shots improve symptoms in 80-85% of patients.   Reactive airway - Have access to albuterol inhaler 2 puffs every 4-6 hours as needed for cough/wheeze/shortness of breath/chest tightness.  May use 15-20 minutes prior to activity.   Monitor frequency of use.    Follow-up in 6-12 months or sooner if needed   Diagnostics: None.  Medication List:  Current Outpatient Medications  Medication Sig Dispense Refill   cetirizine (ZYRTEC) 10 MG chewable tablet Chew 10 mg by mouth daily.     etonogestrel-ethinyl estradiol (NUVARING) 0.12-0.015 MG/24HR vaginal ring INSERT 1 RING VAGINALLY ONCE EVERY 4 WEEKS; LEAVE IN FOR 3 WEEKS AND REMOVE FOR 1 WEEK OR AS DIRECTED 3 each 4   ketoconazole (NIZORAL) 2 % shampoo Apply 1 Application topically 2 (two) times a week. 120 mL 5   LORazepam (ATIVAN) 0.5 MG tablet TAKE 1 TABLET BY MOUTH AT BEDTIME AS NEEDED FOR ANXIETY OR SLEEP 30 tablet 0   Olopatadine HCl 0.2 % SOLN Place one drop per eye once daily as needed for itchy/watery eyes 2.5 mL 2   Olopatadine-Mometasone (RYALTRIS) 665-25 MCG/ACT SUSP Place 2 sprays into the nose daily in the afternoon. 1-2 times daily 29 g 2   predniSONE (DELTASONE) 10 MG tablet Take 2 tablets (20 mg total) by mouth in the morning and at bedtime for 5 days. 20 tablet 0   propranolol (INDERAL) 10 MG tablet TAKE 1 TABLET BY MOUTH DAILY AS NEEDED (PRESENTATIONS). 30 tablet 0   scopolamine (TRANSDERM-SCOP) 1 MG/3DAYS Place 1 patch (1.5 mg total) onto the skin every 3 (three) days. 10 patch 0   tiZANidine (ZANAFLEX) 4 MG tablet Take 1 tablet (4 mg total) by mouth at bedtime. 30 tablet 0   vitamin C (ASCORBIC ACID) 500 MG tablet      albuterol (VENTOLIN HFA) 108 (90 Base) MCG/ACT inhaler Inhale 2 puffs into the lungs every 6 (six) hours as needed for wheezing or shortness of breath. 6.7 g 1   escitalopram (LEXAPRO) 10 MG tablet Take 1 tablet (10 mg  total) by mouth daily. (Patient not taking: Reported on 11/14/2022) 90 tablet 1   Ferrous Gluconate 324 (37.5 Fe) MG TABS  (Patient not taking: Reported on 11/14/2022)     No current facility-administered medications for this visit.   Allergies: Allergies  Allergen Reactions   Doxycycline Other (See Comments)    Decreased WBC, Neutropenia   I reviewed her past medical history, social history, family history, and environmental history and no significant changes have been reported from previous visit on 09/04/2022.  Review of Systems  Constitutional:  Positive for fatigue and fever.  HENT:  Positive for congestion, rhinorrhea, sinus pressure, sinus pain and sore throat.   Respiratory:  Positive for cough.   Musculoskeletal:  Positive for myalgias.   Objective: Physical Exam Not obtained as encounter was done via telephone.   Previous notes and tests were reviewed.  I discussed the assessment and treatment plan with the patient. The patient was provided an opportunity to ask questions and all were answered. The patient agreed with the plan and demonstrated an understanding of the instructions.   The patient was advised to call back or seek an in-person evaluation if the symptoms  worsen or if the condition fails to improve as anticipated.  I provided 19 minutes of non-face-to-face time during this encounter.  It was my pleasure to participate in [place] care today. Please feel free to contact me with any questions or concerns.   Sincerely,  Joclyn Alsobrook Charmian Muff, MD

## 2022-11-14 NOTE — Patient Instructions (Addendum)
Upper respiratory illness -COVID-positive -Discussed options of the antiviral COVID medications however with previous COVID illness did not seem that the antiviral changed the course of her illness.  We opted to not proceed with this option. -For symptom control I did discuss a prednisone burst to help with her respiratory and sinus symptoms.  Recommend prednisone 20 mg twice a day for the next 5 days -Refilled her albuterol inhaler for as needed use -Recommended she continue adequate hydration and get rest -She can continue use of her Sudafed medications as needed for sinus pressure and pain relief -She can continue alternating between Tylenol and ibuprofen for fever control  Allergic rhinitis with conjunctivitis - Continue avoidance measures grasses, weeds, and trees. - Continue with: Zyrtec daily.  Every 6 months can rotate between [place] or [place]ra.  Ryaltris (olopatadine/mometasone) two sprays per nostril 1-2 times daily as needed Pataday (olopatadine) one drop per eye once daily as needed for itchy/watery eyes - You can use an extra dose of the antihistamine, if needed, for breakthrough symptoms.  - Consider nasal saline rinses 1-2 times daily to remove allergens from the nasal cavities as well as help with mucous clearance (this is especially helpful to do before the nasal sprays are given) - Consider allergy shots as a means of long-term control if medication management is not effective.  Discussed our 2 options for starting shots: RUSH vs traditional.   Informational packet provided.  - Allergy shots "re-train" and "reset" the immune system to ignore environmental allergens and decrease the resulting immune response to those allergens (sneezing, itchy watery eyes, runny nose, nasal congestion, etc).    - Allergy shots improve symptoms in 80-85% of patients.   Reactive airway - Have access to albuterol inhaler 2 puffs every 4-6 hours as needed for cough/wheeze/shortness of breath/chest  tightness.  May use 15-20 minutes prior to activity.   Monitor frequency of use.    Follow-up in 6-12 months or sooner if needed

## 2022-11-27 ENCOUNTER — Other Ambulatory Visit: Payer: Self-pay

## 2022-11-27 NOTE — Progress Notes (Unsigned)
[facility] [address] [place] Phone: [phone] Subjective:   IVilma Meckel, am serving as a scribe for Dr. Hulan Saas.  I'm seeing this patient by the request  of:  Binnie Rail, MD  CC: Neck pain, heel pain  [id number]  Monae Mccaffery is a 32 y.o. female coming in with complaint of back and neck pain. OMT on 10/03/2022. Also seen for heel pain. Patient states doing well today. Issue have been in neck and shoulder area. No other concerns.  Medications patient has been prescribed:   Taking:         Reviewed prior external information including notes and imaging from previsou exam, outside providers and external EMR if available.   As well as notes that were available from care everywhere and other healthcare systems.  Past medical history, social, surgical and family history all reviewed in electronic medical record.  No pertanent information unless stated regarding to the chief complaint.   Past Medical History:  Diagnosis Date   Anemia    Hyperlipidemia     Allergies  Allergen Reactions   Doxycycline Other (See Comments)    Decreased WBC, Neutropenia     Review of Systems:  No headache, visual changes, nausea, vomiting, diarrhea, constipation, dizziness, abdominal pain, skin rash, fevers, chills, night sweats, weight loss, swollen lymph nodes, body aches, joint swelling, chest pain, shortness of breath, mood changes. POSITIVE muscle aches  Objective  Blood pressure 126/80, pulse 93, height 5\' 4"  (1.626 m), weight 145 lb (65.8 kg), SpO2 97 %.   General: No apparent distress alert and oriented x3 mood and affect normal, dressed appropriately.  HEENT: Pupils equal, extraocular movements intact  Respiratory: Patient's speak in full sentences and does not appear short of breath  Cardiovascular: No lower extremity edema, non tender, no erythema  Foot exam shows the patient does have some mild breakdown of  the longitudinal arch on the left side.  Some mild tenderness noted over the heel itself.  Osteopathic findings  C2 flexed rotated and side bent left T3 extended rotated and side bent left inhaled rib T8 extended rotated and side bent left L1 flexed rotated and side bent right Sacrum right on right       Assessment and Plan:  Slipped rib syndrome Slipped rib syndrome noted.  Discussed icing regimen and home exercises.  Overall patient should do relatively well.  Discussed icing regimen and home exercises, patient will continue to work on posture.  Patient has been doing more lifting recently.  Follow-up again in 6 weeks  Heel pain Worsening symptoms.  Discussed icing regimen and homeChronic problem with exercise.  Activities to do and continues to avoid.  Do think hypermobility,  F/u 6 weeks.  Having more trouble consider custom orthotics    Nonallopathic problems  Decision today to treat with OMT was based on Physical Exam  After verbal consent patient was treated with HVLA, ME, FPR techniques in cervical, rib, thoracic, lumbar, and sacral  areas  Patient tolerated the procedure well with improvement in symptoms  Patient given exercises, stretches and lifestyle modifications  See medications in patient instructions if given  Patient will follow up in 4-8 weeks     The above documentation has been reviewed and is accurate and complete [name], DO         Note: This dictation was prepared with Dragon dictation along with smaller phrase technology. Any transcriptional errors that result from this  process are unintentional.

## 2022-11-28 ENCOUNTER — Ambulatory Visit (INDEPENDENT_AMBULATORY_CARE_PROVIDER_SITE_OTHER): Payer: Commercial Managed Care - PPO | Admitting: Family Medicine

## 2022-11-28 VITALS — BP 126/80 | HR 93 | Ht 64.0 in | Wt 145.0 lb

## 2022-11-28 DIAGNOSIS — M9902 Segmental and somatic dysfunction of thoracic region: Secondary | ICD-10-CM | POA: Diagnosis not present

## 2022-11-28 DIAGNOSIS — M79673 Pain in unspecified foot: Secondary | ICD-10-CM

## 2022-11-28 DIAGNOSIS — M9901 Segmental and somatic dysfunction of cervical region: Secondary | ICD-10-CM | POA: Diagnosis not present

## 2022-11-28 DIAGNOSIS — M9903 Segmental and somatic dysfunction of lumbar region: Secondary | ICD-10-CM

## 2022-11-28 DIAGNOSIS — M940 Chondrocostal junction syndrome [Tietze]: Secondary | ICD-10-CM | POA: Diagnosis not present

## 2022-11-28 DIAGNOSIS — M9904 Segmental and somatic dysfunction of sacral region: Secondary | ICD-10-CM

## 2022-11-28 DIAGNOSIS — M9908 Segmental and somatic dysfunction of rib cage: Secondary | ICD-10-CM | POA: Diagnosis not present

## 2022-11-28 NOTE — Assessment & Plan Note (Signed)
Worsening symptoms.  Discussed icing regimen and homeChronic problem with exercise.  Activities to do and continues to avoid.  Do think hypermobility,  F/u 6 weeks.  Having more trouble consider custom orthotics

## 2022-11-28 NOTE — Patient Instructions (Signed)
Transverse arch exercises See me again in 6-8 weeks

## 2022-11-28 NOTE — Assessment & Plan Note (Signed)
Slipped rib syndrome noted.  Discussed icing regimen and home exercises.  Overall patient should do relatively well.  Discussed icing regimen and home exercises, patient will continue to work on posture.  Patient has been doing more lifting recently.  Follow-up again in 6 weeks

## 2022-12-04 IMAGING — US US BREAST*R* LIMITED INC AXILLA
1 series · 6 of 6 positions shown · non-contrast
Comparison: 09/06/2020, 03/02/2020

CLINICAL DATA: Six-month follow-up for probably benign complicated
cyst in the LEFT breast. Patient has a newly palpable mass in the
RIGHT breast.

EXAM:
ULTRASOUND OF THE BILATERAL BREAST

[Series 1: us breast*right* limited inc axilla · 0.07mm/px · 6 of 6 slices shown]
[im 1/6]
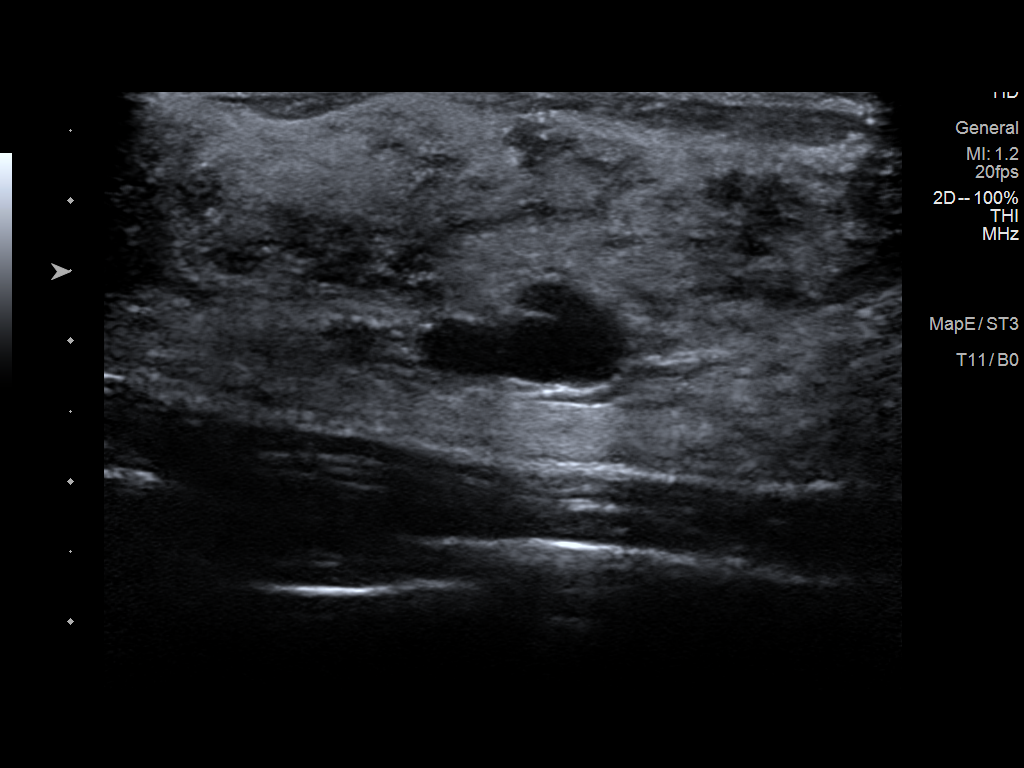
[im 2/6]
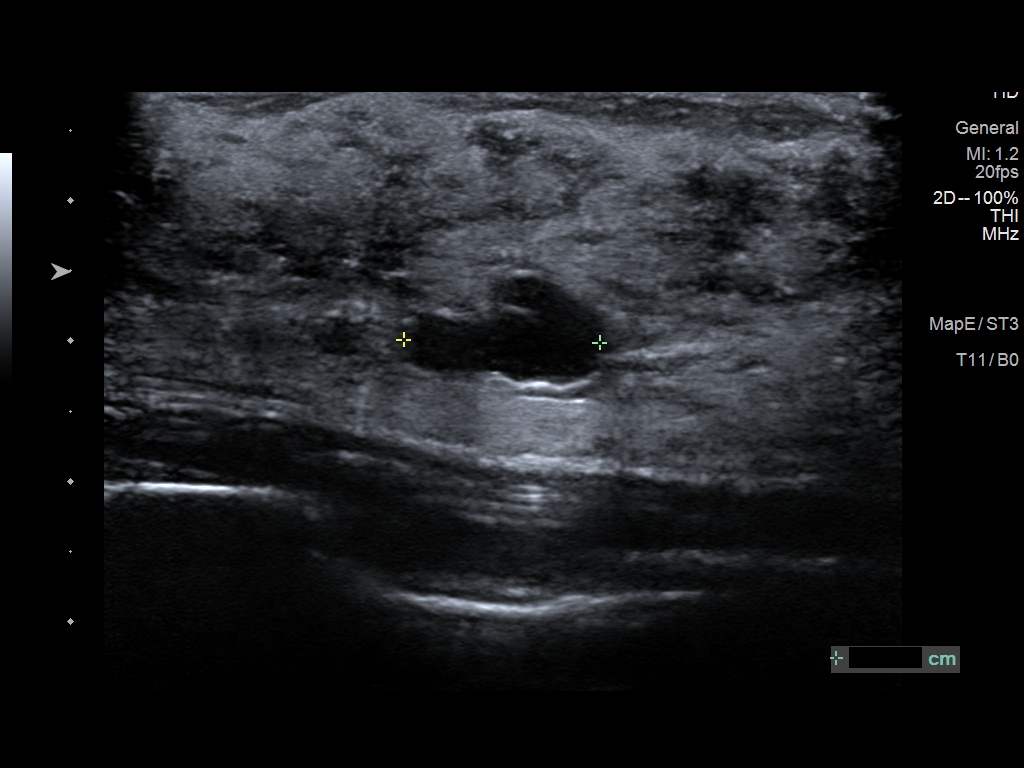
[im 3/6]
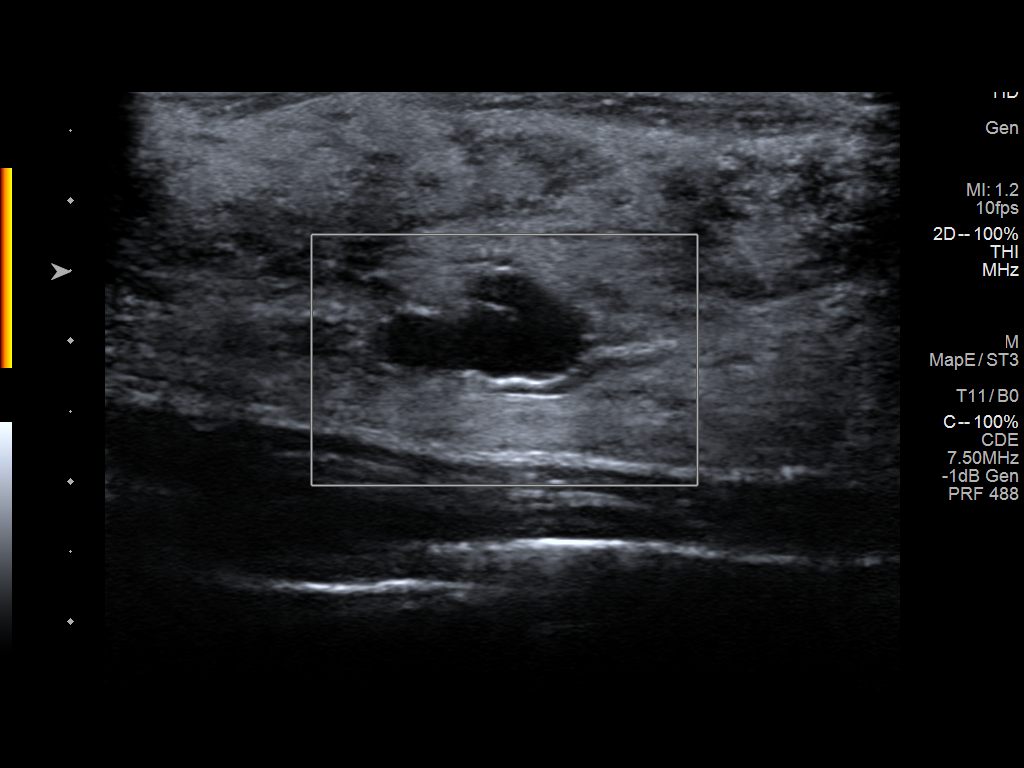
[im 4/6]
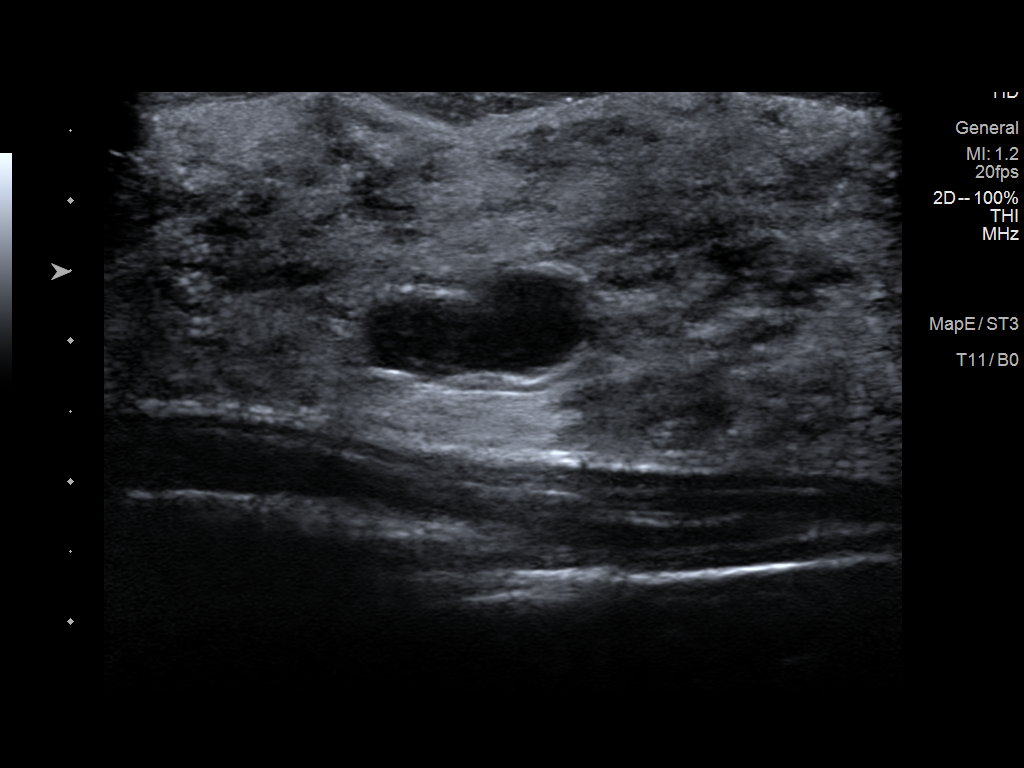
[im 5/6]
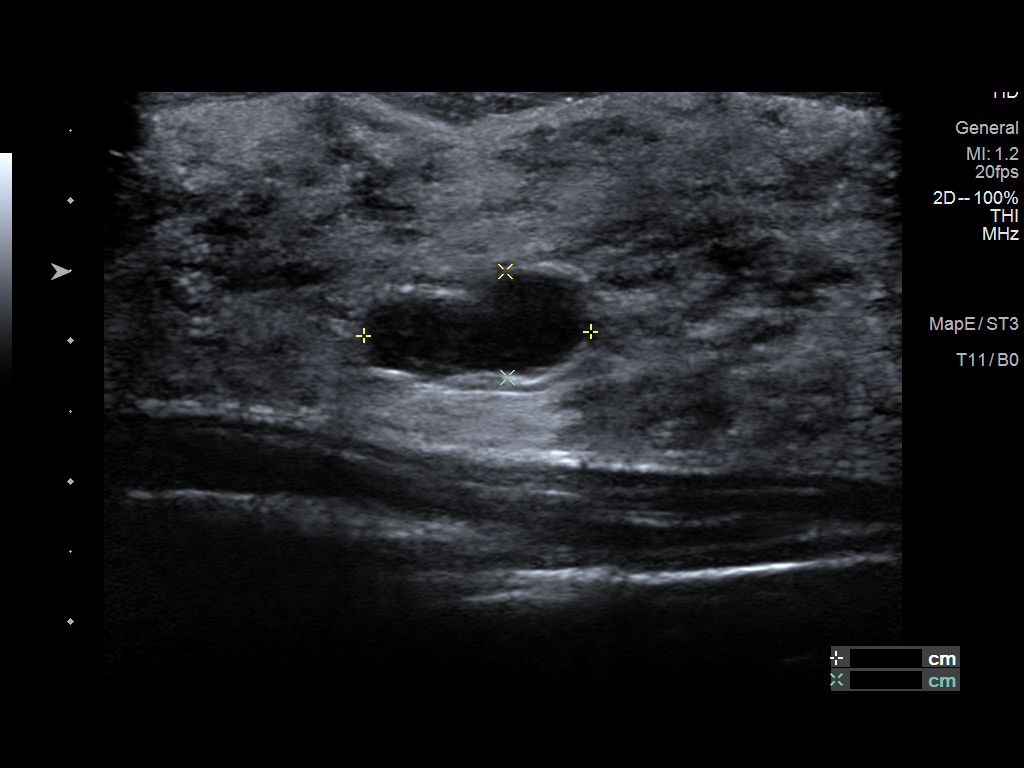
[im 6/6]
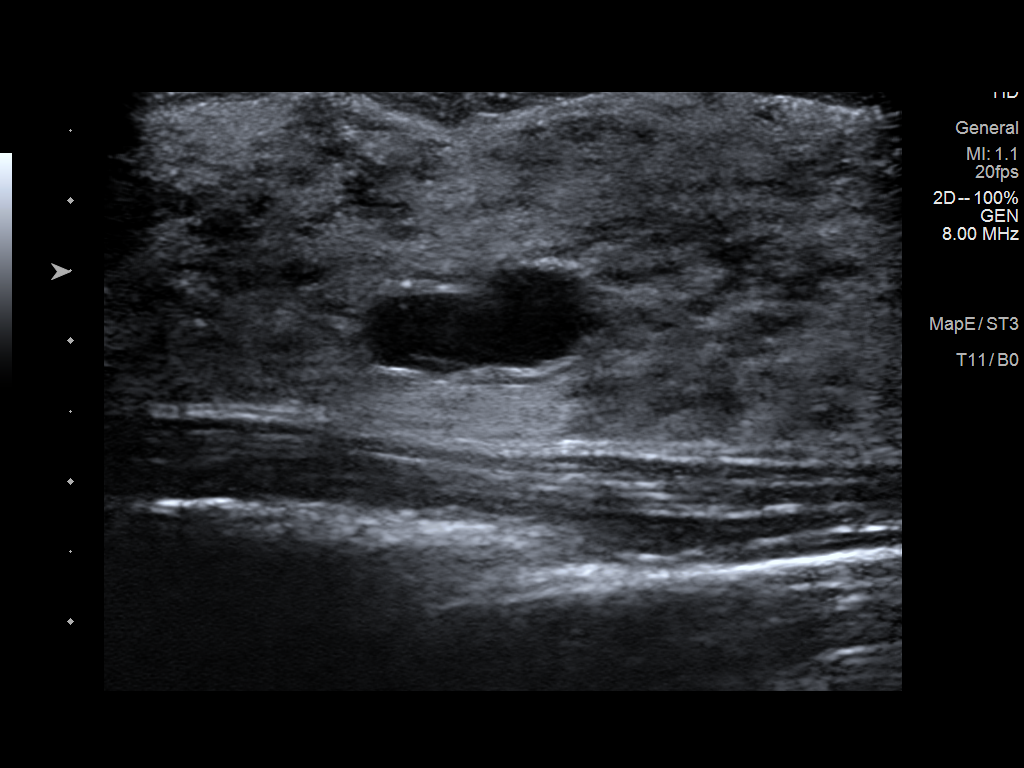

[6 of 6 positions shown; findings below may reference images not displayed]

FINDINGS: RIGHT BREAST:

Physical Exam: I palpate an oval multiple smooth mass in the
UPPER-OUTER QUADRANT of the RIGHT breast corresponding to the area
concern. Patient reports no associated tenderness.

Ultrasound: Targeted ultrasound is performed, showing a benign cyst
in the 10:30 o'clock location of the RIGHT breast 2 centimeters from
the nipple which measures 1.4 x 1.6 x 0.8 centimeters. No solid
component or areas of acoustic shadowing.

LEFT BREAST:

Ultrasound: Targeted ultrasound is performed, showing significantly
smaller hypoechoic mass in the 11:30 o'clock location of the LEFT
breast 2 centimeters from the nipple, now measuring 0.3 x 0.2 x
centimeters. Previously mass measured 1.2 x 0.9 x 1.2 centimeters.
IMPRESSION: 1. Palpable mass in the RIGHT breast is a benign cyst.
2. Resolving cyst in the 11:30 o'clock location of the LEFT breast.
3.  There is no sonographic evidence for malignancy.

RECOMMENDATION:
Screening mammogram at age 40 unless there are persistent or
intervening clinical concerns. (Code:M4-O-3IE)

I have discussed the findings and recommendations with the patient.
If applicable, a reminder letter will be sent to the patient
regarding the next appointment.

BI-RADS CATEGORY  1: Negative.

## 2022-12-04 IMAGING — US US BREAST*L* LIMITED INC AXILLA
1 series · 6 of 6 positions shown · non-contrast
Comparison: 09/06/2020, 03/02/2020

CLINICAL DATA: Six-month follow-up for probably benign complicated
cyst in the LEFT breast. Patient has a newly palpable mass in the
RIGHT breast.

EXAM:
ULTRASOUND OF THE BILATERAL BREAST

[Series 1: us breast*left* limited inc axilla · 0.06mm/px · 6 of 6 slices shown]
[im 1/6]
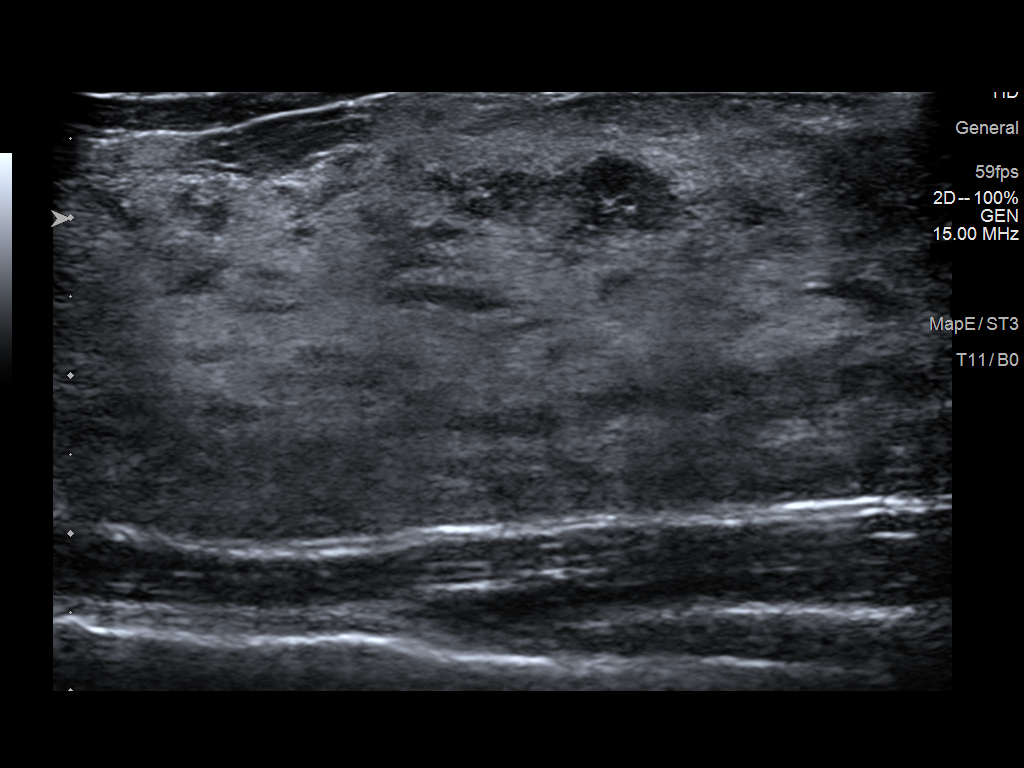
[im 2/6]
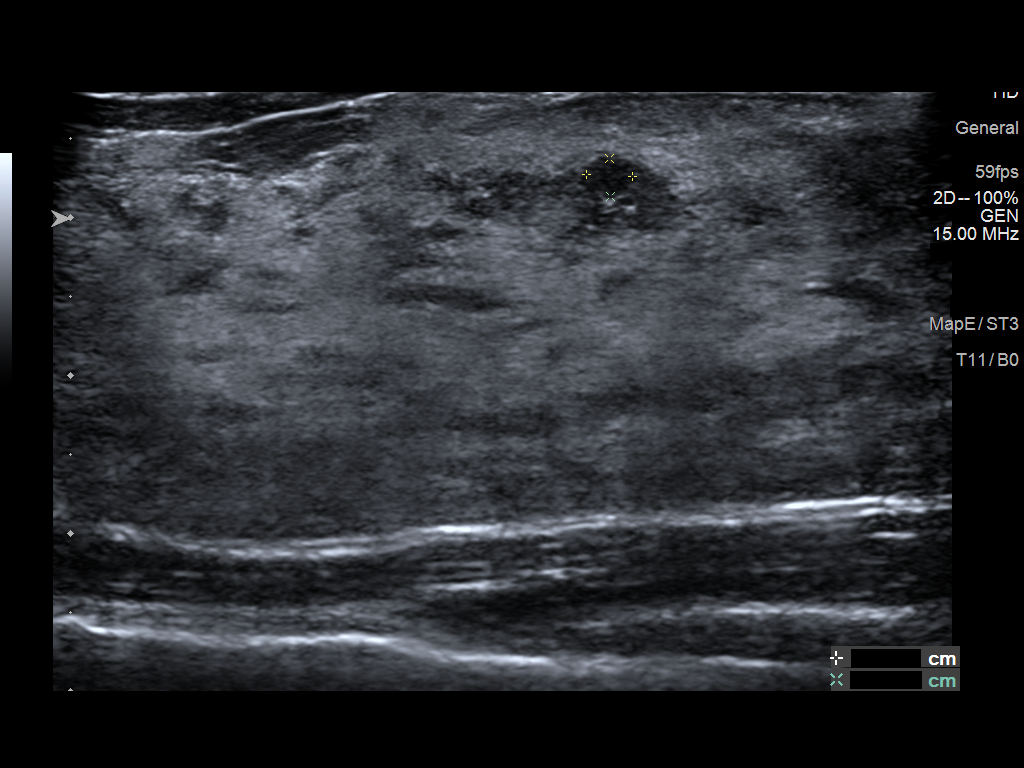
[im 3/6]
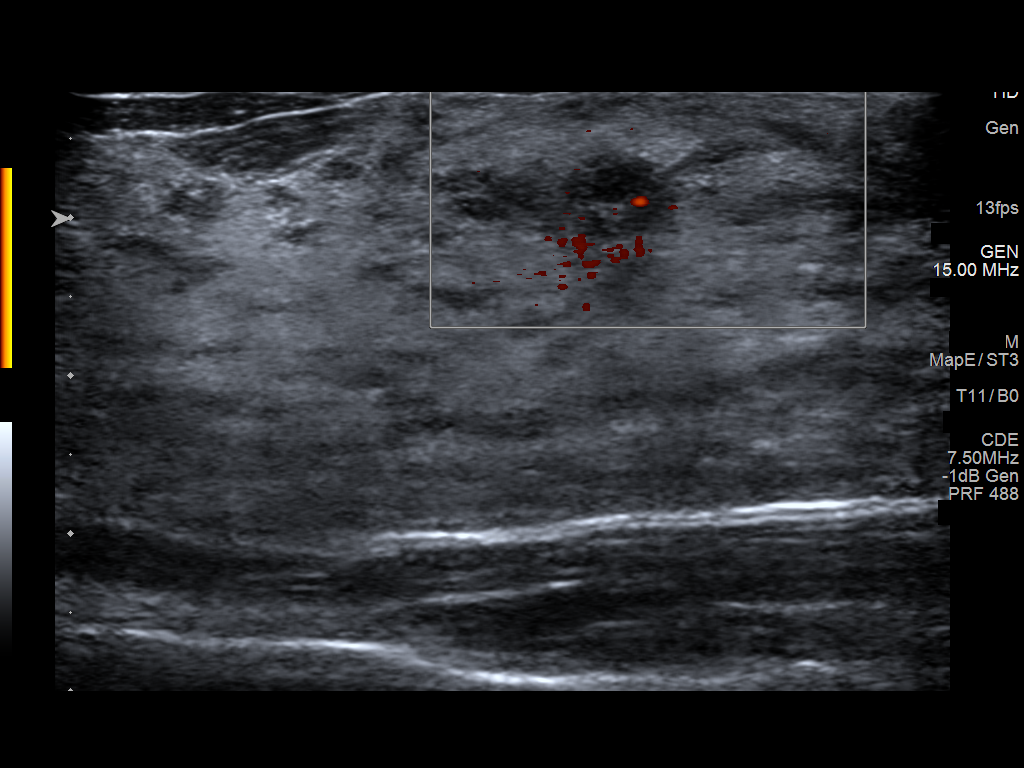
[im 4/6]
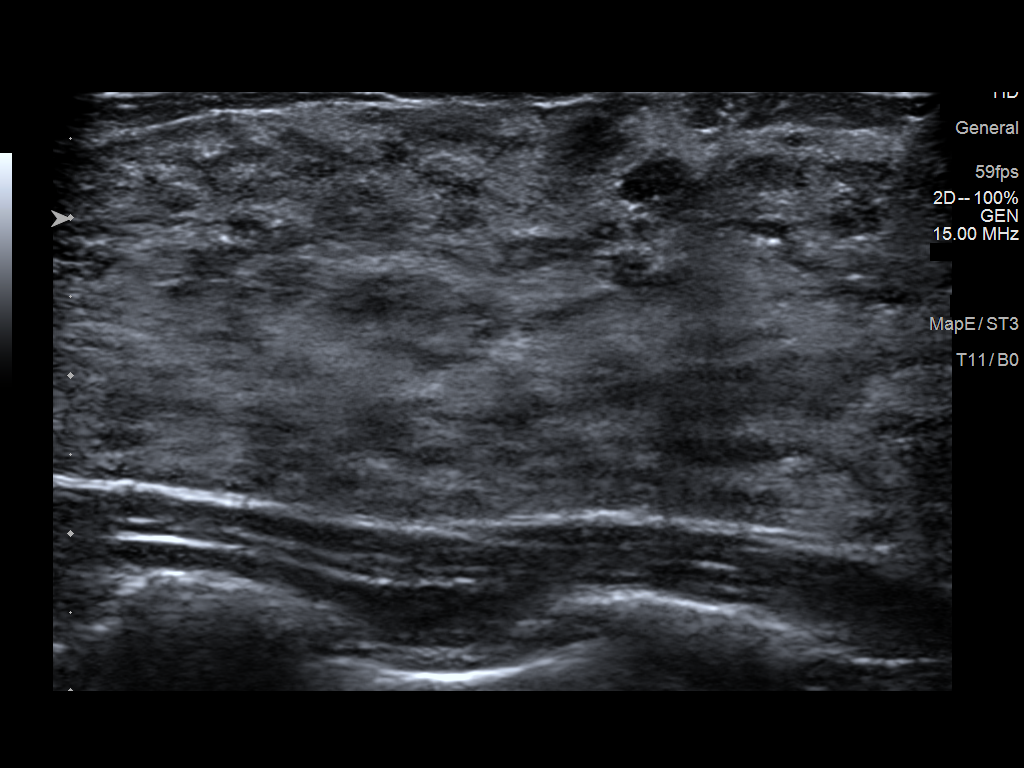
[im 5/6]
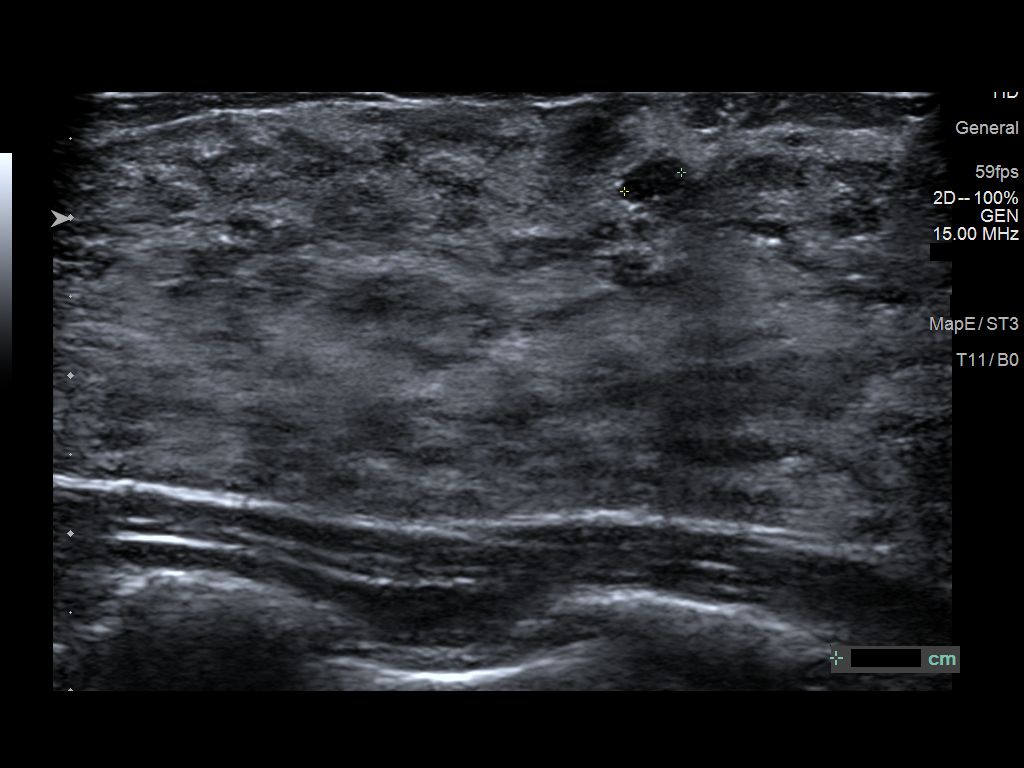
[im 6/6]
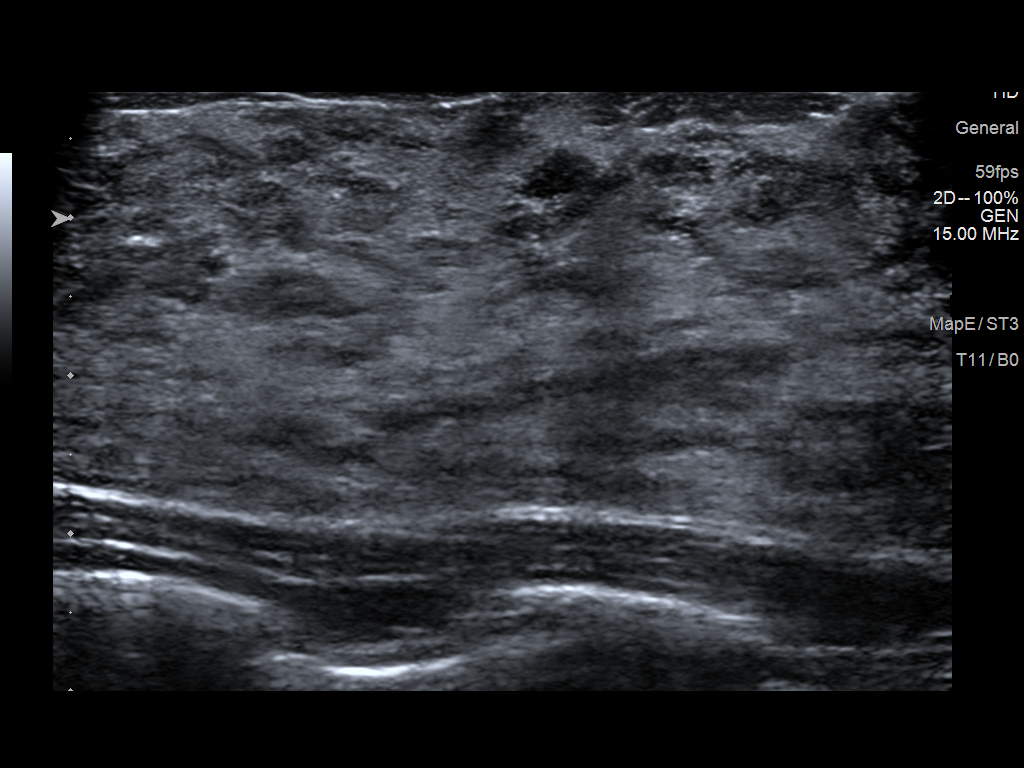

[6 of 6 positions shown; findings below may reference images not displayed]

FINDINGS: RIGHT BREAST:

Physical Exam: I palpate an oval multiple smooth mass in the
UPPER-OUTER QUADRANT of the RIGHT breast corresponding to the area
concern. Patient reports no associated tenderness.

Ultrasound: Targeted ultrasound is performed, showing a benign cyst
in the 10:30 o'clock location of the RIGHT breast 2 centimeters from
the nipple which measures 1.4 x 1.6 x 0.8 centimeters. No solid
component or areas of acoustic shadowing.

LEFT BREAST:

Ultrasound: Targeted ultrasound is performed, showing significantly
smaller hypoechoic mass in the 11:30 o'clock location of the LEFT
breast 2 centimeters from the nipple, now measuring 0.3 x 0.2 x
centimeters. Previously mass measured 1.2 x 0.9 x 1.2 centimeters.
IMPRESSION: 1. Palpable mass in the RIGHT breast is a benign cyst.
2. Resolving cyst in the 11:30 o'clock location of the LEFT breast.
3.  There is no sonographic evidence for malignancy.

RECOMMENDATION:
Screening mammogram at age 40 unless there are persistent or
intervening clinical concerns. (Code:M4-O-3IE)

I have discussed the findings and recommendations with the patient.
If applicable, a reminder letter will be sent to the patient
regarding the next appointment.

BI-RADS CATEGORY  1: Negative.

## 2023-01-02 IMAGING — CR DG RIBS 2V*R*
1 series · 3 of 3 positions shown · non-contrast
Comparison: None.

CLINICAL DATA: Right-sided chest pain following excessive coughing,
initial encounter

EXAM:
RIGHT RIBS - 2 VIEW

[Series 1: dg ribs unilateral right · 0.14mm/px · 3 of 3 slices shown]
[im 1/3]
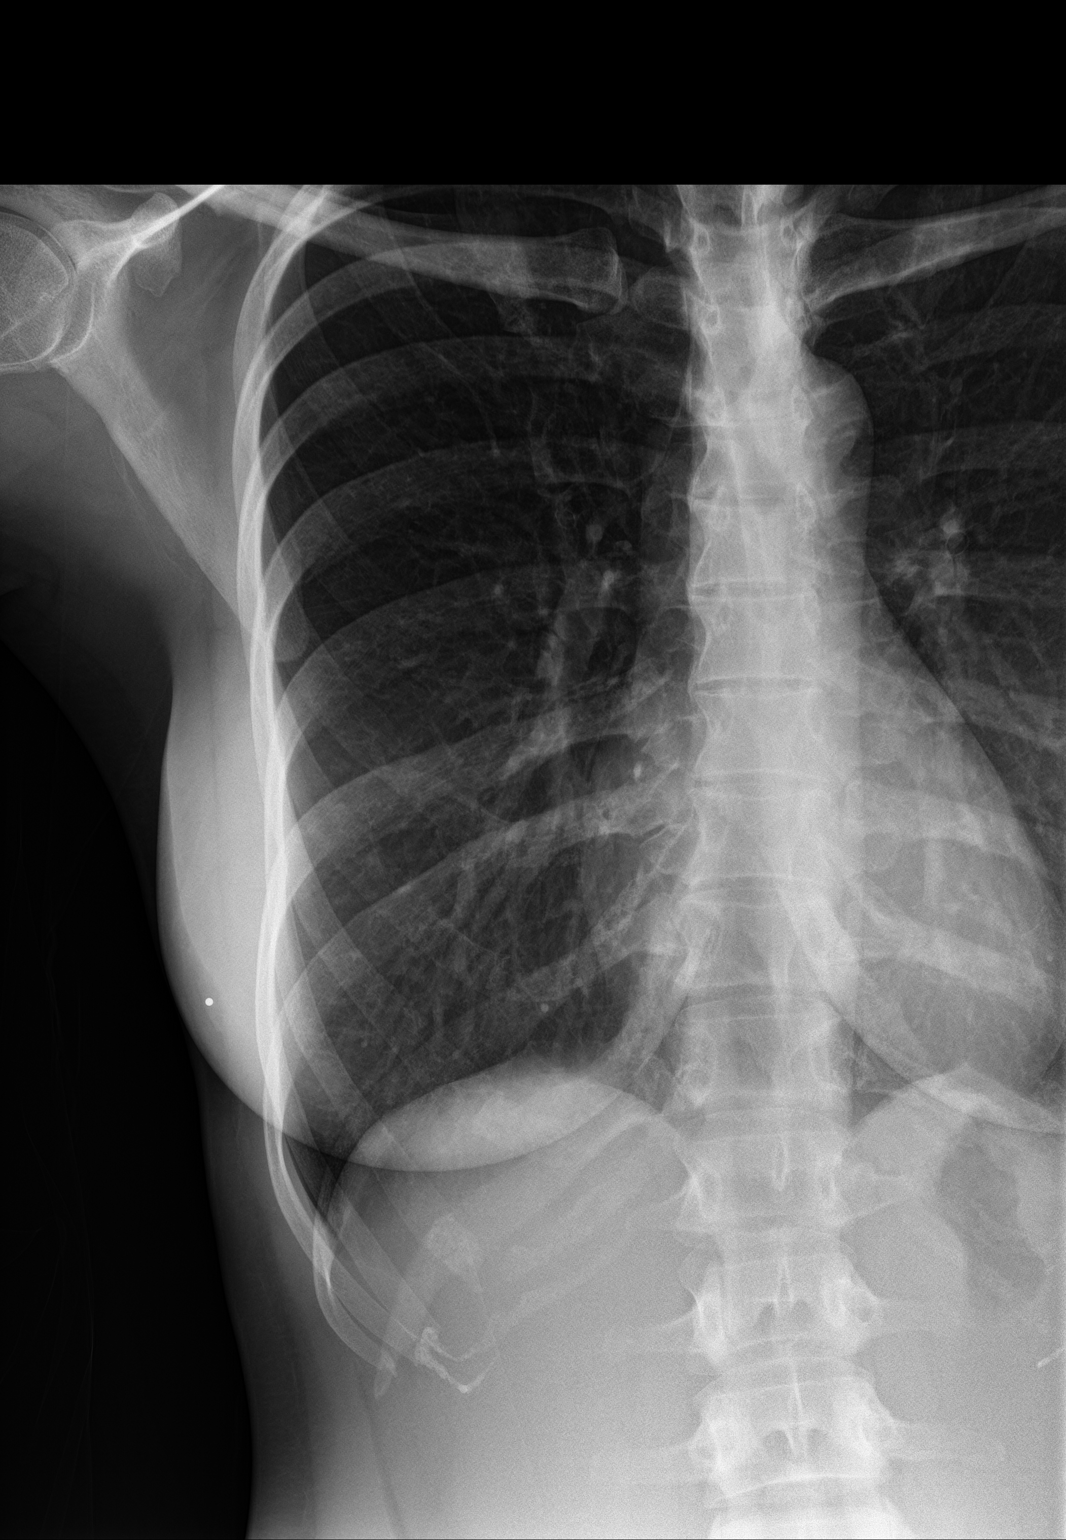
[im 2/3]
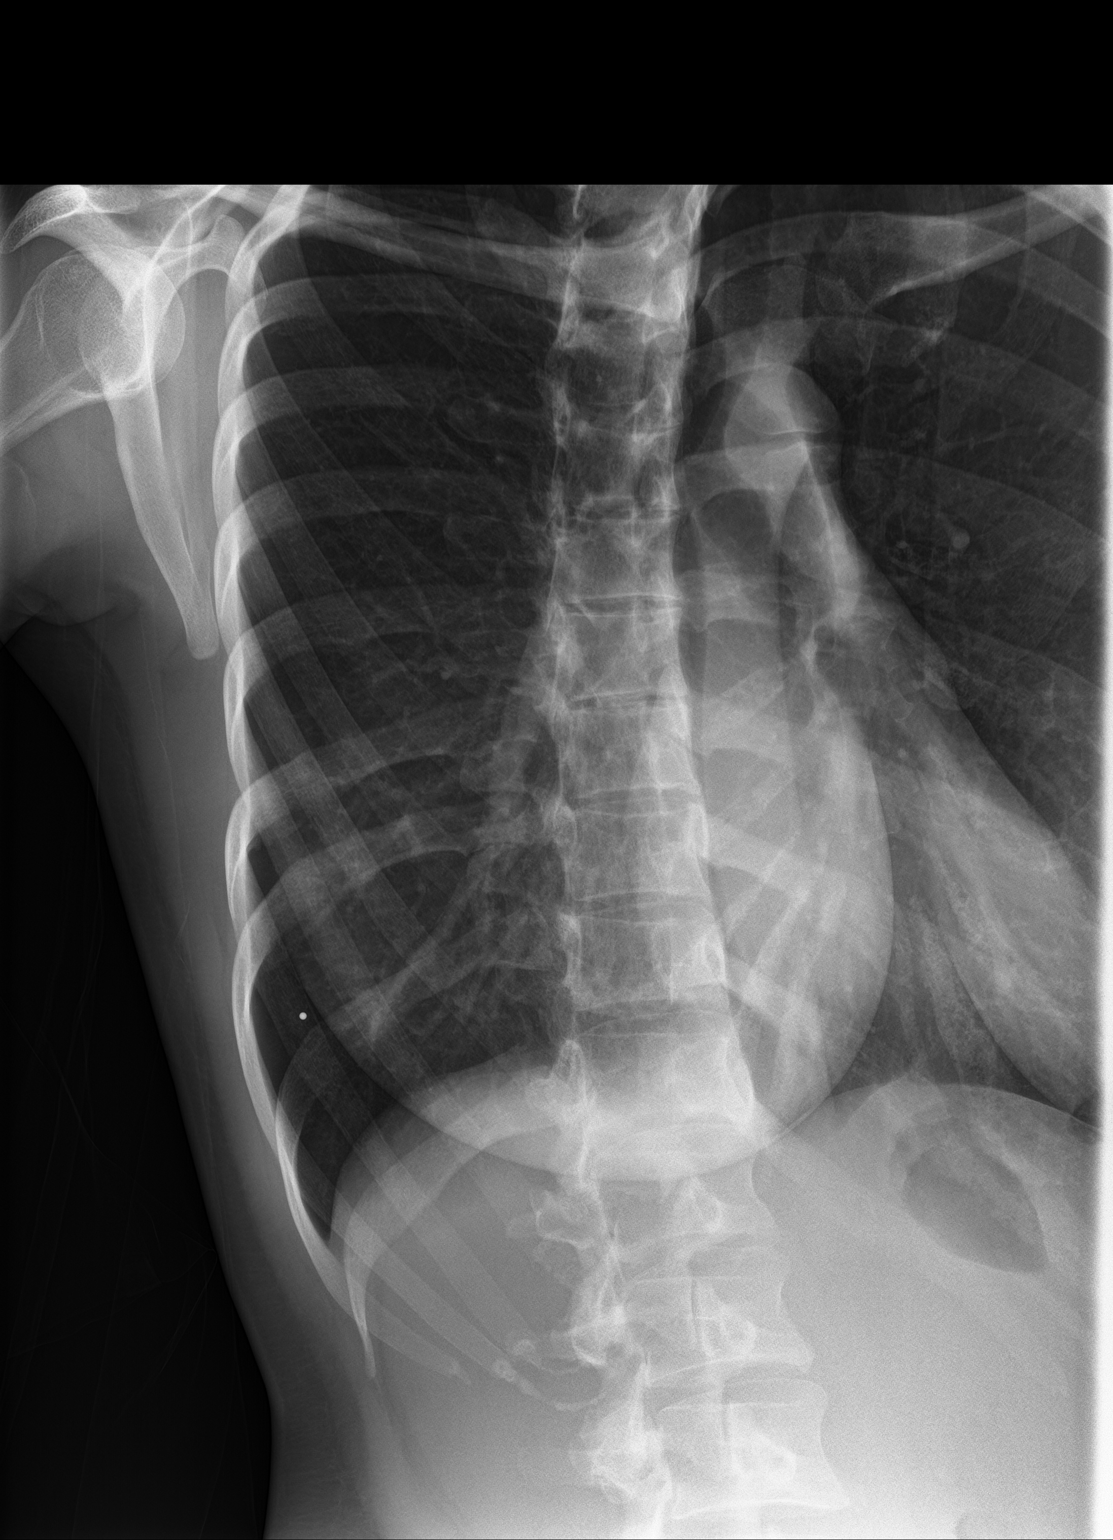
[im 3/3]
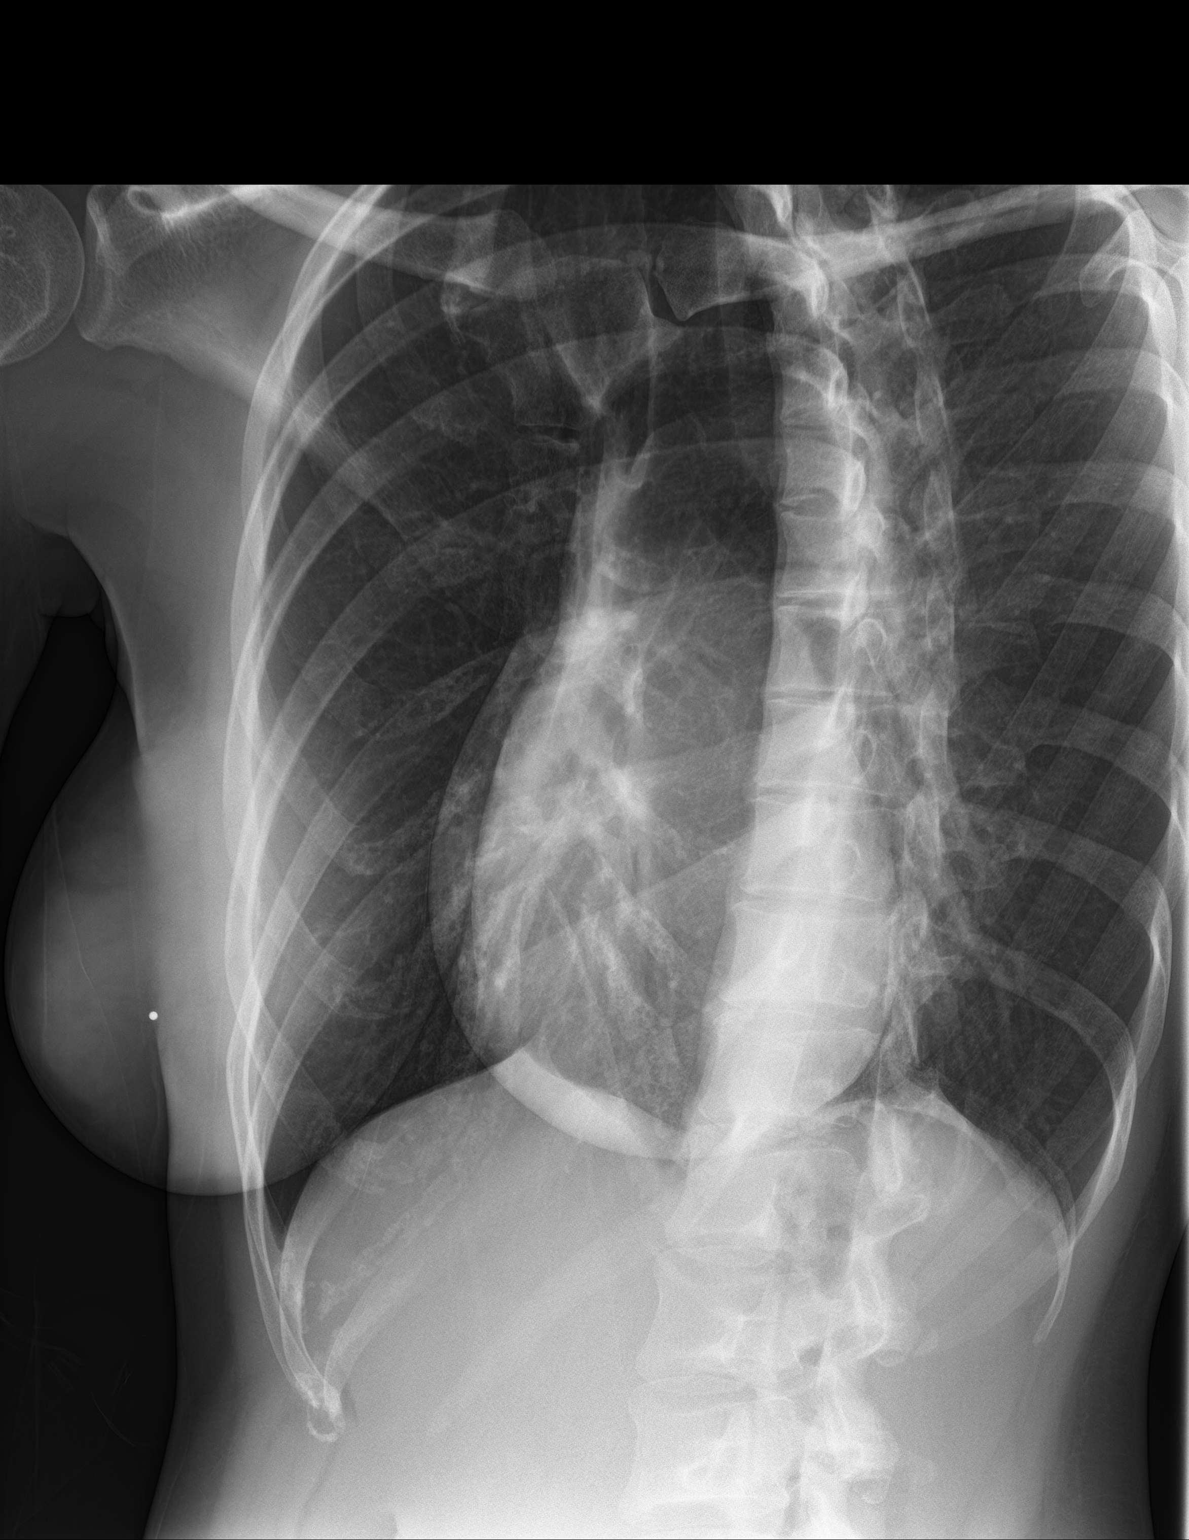

[3 of 3 positions shown; findings below may reference images not displayed]

FINDINGS: No acute rib abnormality is noted. No pneumothorax is seen. Lungs
are clear.
IMPRESSION: No acute rib fracture noted.

## 2023-01-09 IMAGING — CT CT ANGIO CHEST
2 of 6 series · 19 of 46 positions shown · IV contrast (OMNIPAQUE 350)
Comparison: None.

CLINICAL DATA: Chest pain, right upper quadrant pain with deep
inspiration, recent travel, birth control, elevated D-dimer,
evaluate for PE

EXAM:
CT ANGIOGRAPHY CHEST WITH CONTRAST
TECHNIQUE: Multidetector CT imaging of the chest was performed using the
standard protocol during bolus administration of intravenous
contrast. Multiplanar CT image reconstructions and MIPs were
obtained to evaluate the vascular anatomy.
CONTRAST:  80mL OMNIPAQUE IOHEXOL 350 MG/ML SOLN

[Series 5: thins · axial · 0.58mm/px · z∈[-273,-41]mm · 16 of 262 slices shown]
[im 15/262  lung]
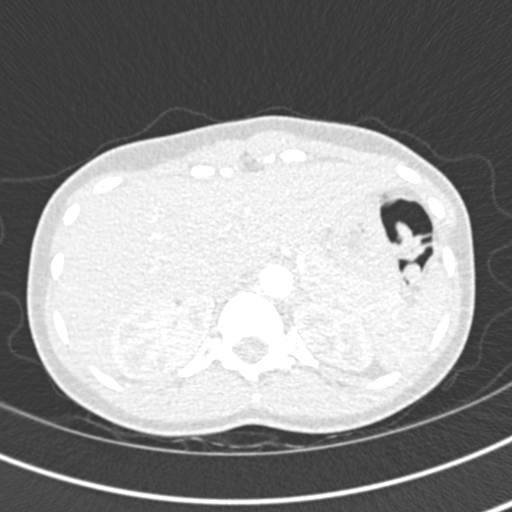
[im 30/262  soft-tissue]
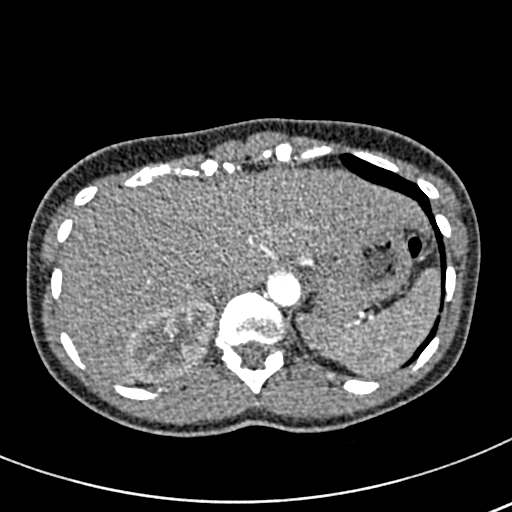
[im 44/262  lung]
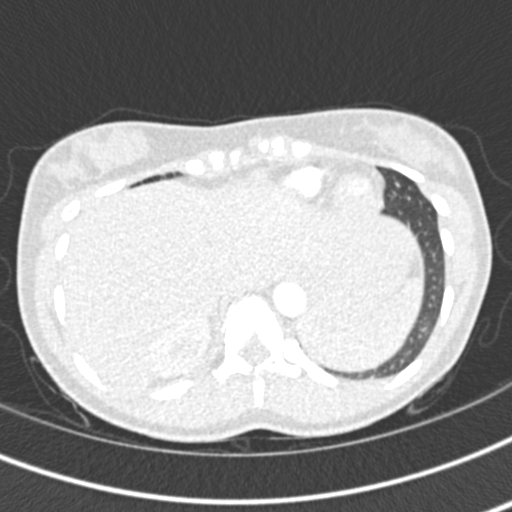
[im 59/262  soft-tissue]
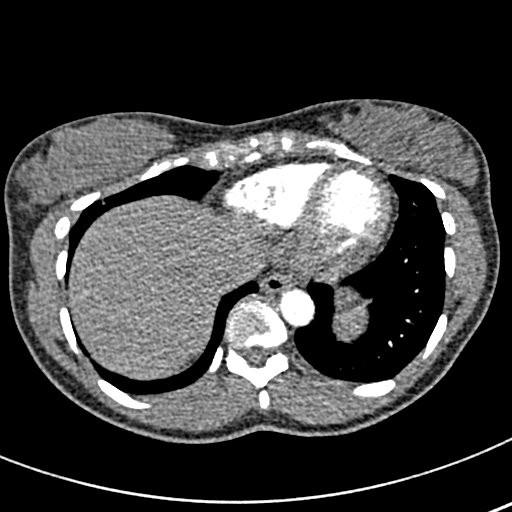
[im 73/262  lung]
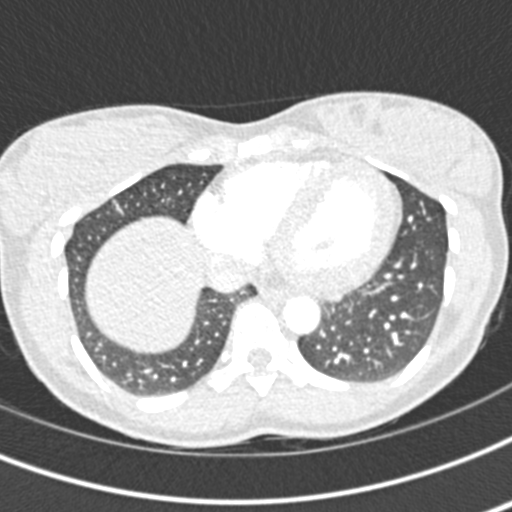
[im 88/262  soft-tissue]
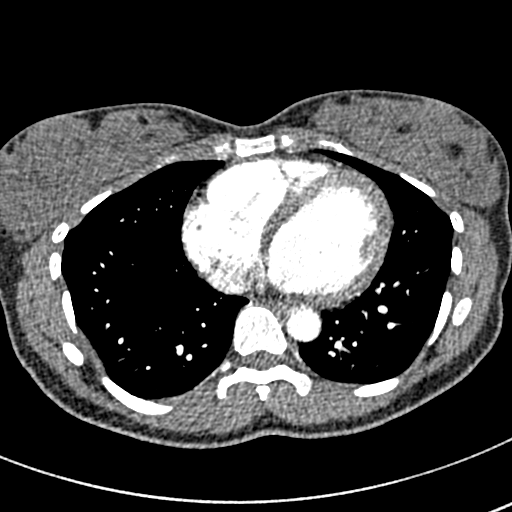
[im 102/262  lung]
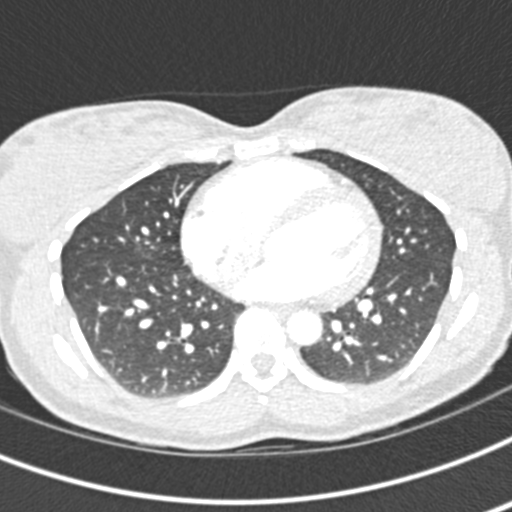
[im 117/262  soft-tissue]
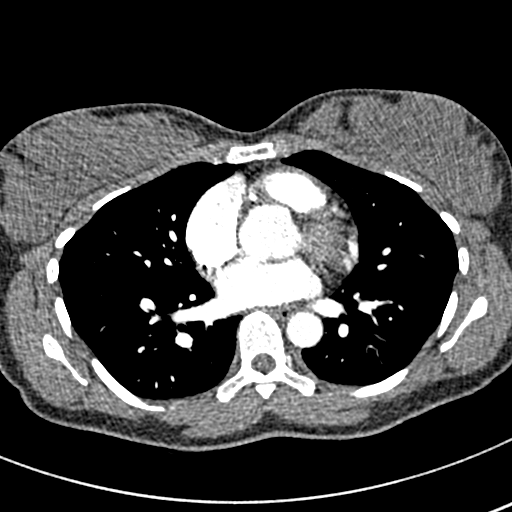
[im 146/262  lung]
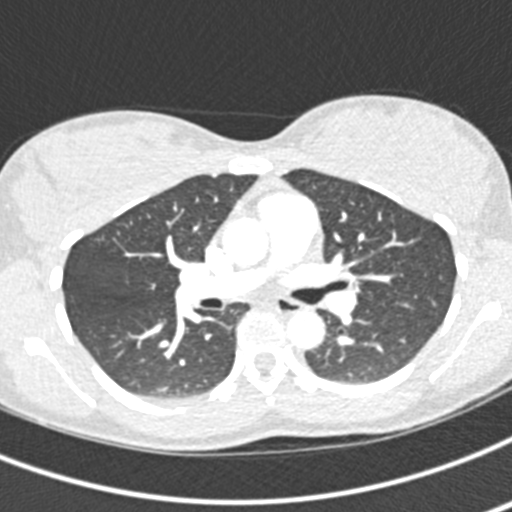
[im 160/262  soft-tissue]
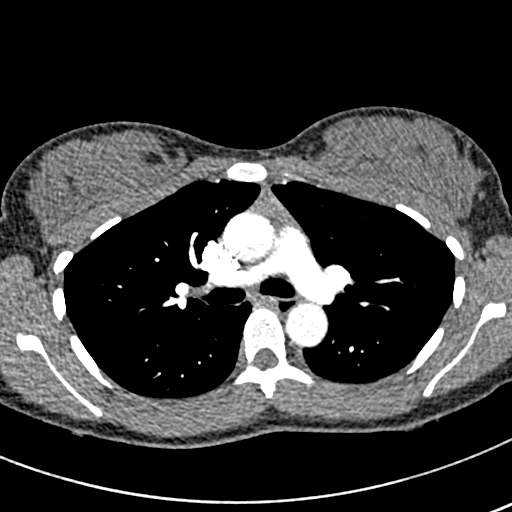
[im 175/262  lung]
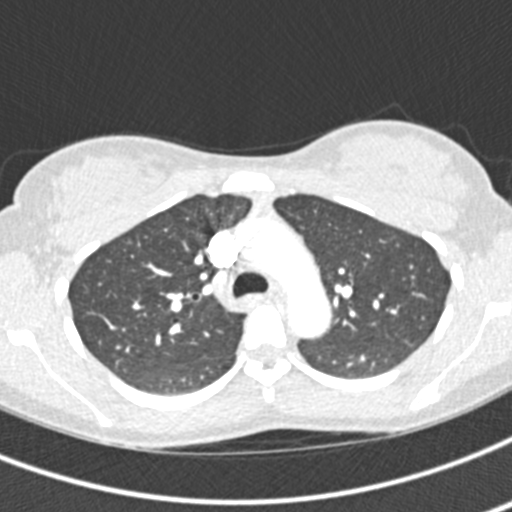
[im 189/262  soft-tissue]
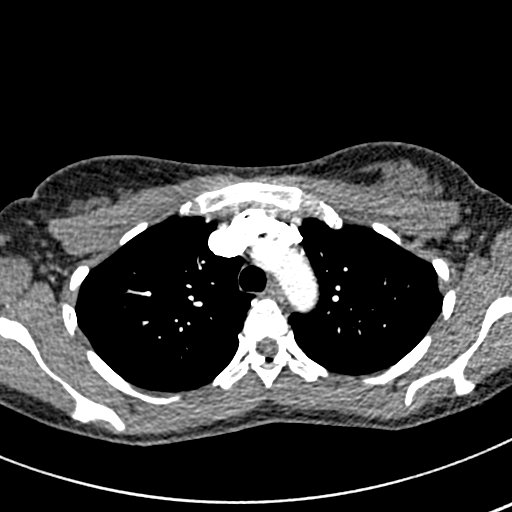
[im 204/262  lung]
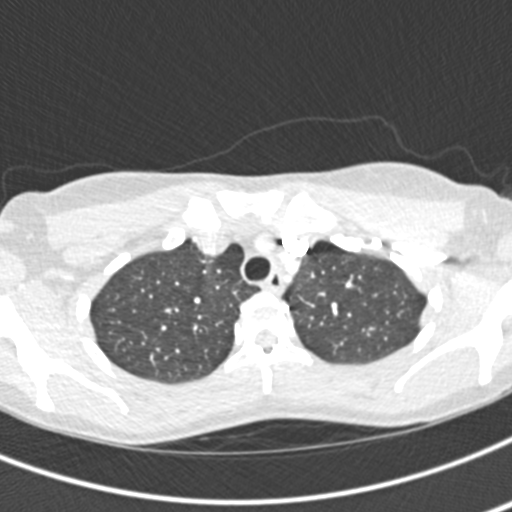
[im 218/262  soft-tissue]
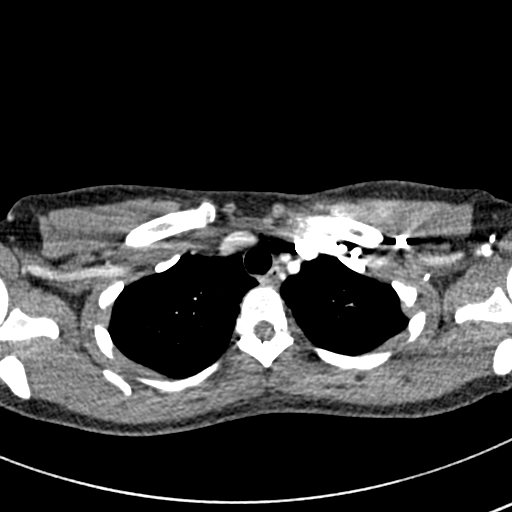
[im 233/262  lung]
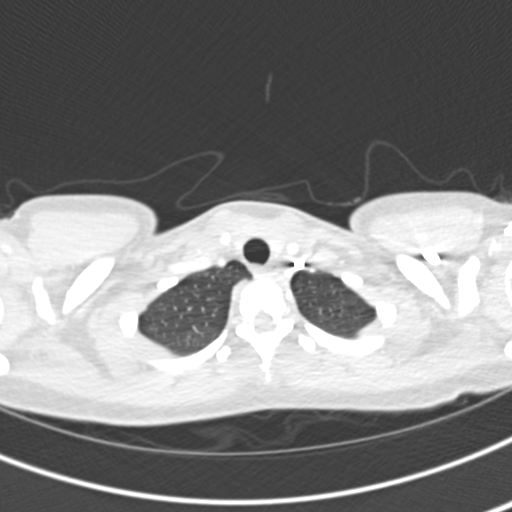
[im 247/262  soft-tissue]
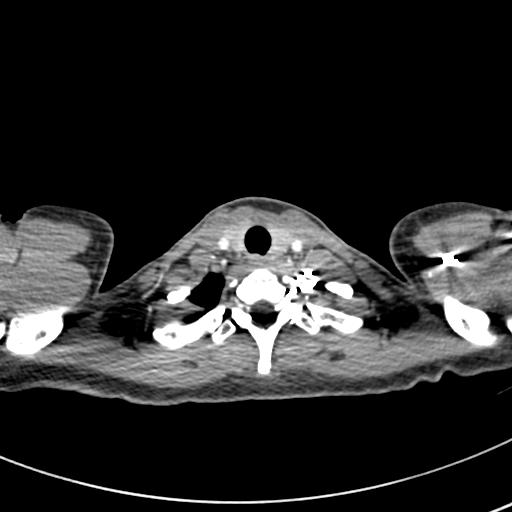

[Series 7: coronal mpr · coronal · 0.54mm/px · 3 of 96 slices shown]
[im 32/96  soft-tissue]
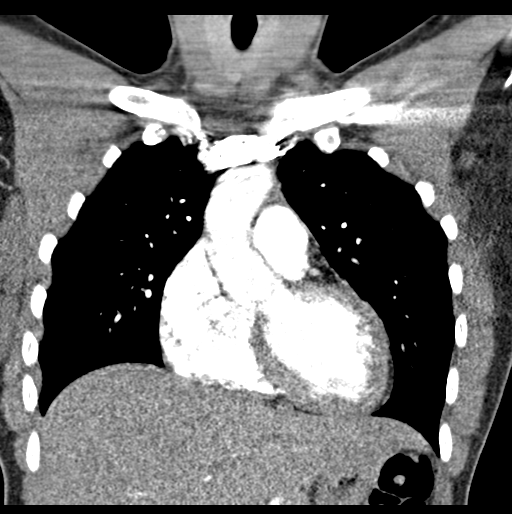
[im 43/96  soft-tissue]
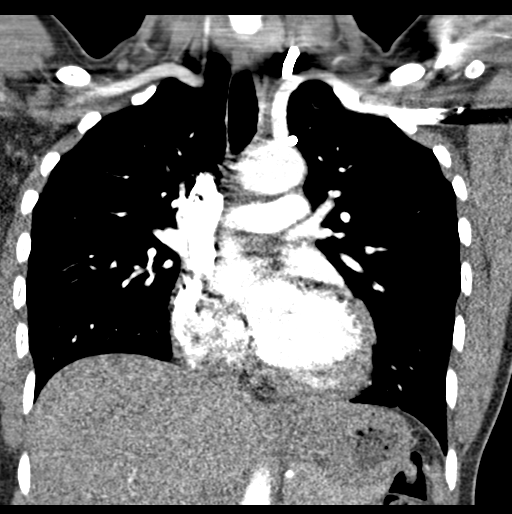
[im 53/96  soft-tissue]
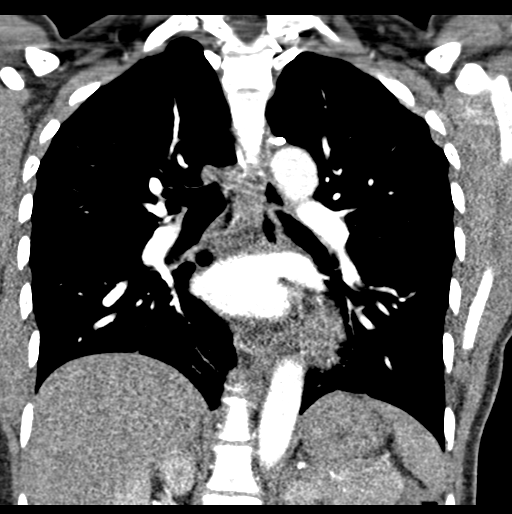

[19 of 46 positions shown; findings below may reference images not displayed]

FINDINGS: Cardiovascular: Satisfactory opacification of the pulmonary arteries
to the segmental level. No evidence of pulmonary embolism. Normal
heart size. No pericardial effusion.

Mediastinum/Nodes: No enlarged mediastinal, hilar, or axillary lymph
nodes. Thymic remnant in the anterior mediastinum. Thyroid gland,
trachea, and esophagus demonstrate no significant findings.

Lungs/Pleura: Lungs are clear. Incidental note of a 5 mm
ground-glass nodule of the anterior right lower lobe (series 6,
image 48). No pleural effusion or pneumothorax.

Upper Abdomen: No acute abnormality.

Musculoskeletal: No chest wall abnormality. No acute or significant
osseous findings.

Review of the MIP images confirms the above findings.
IMPRESSION: 1. Negative examination for pulmonary embolism.
2. Incidental note of a 5 mm ground-glass nodule of the anterior
right lower lobe. This is almost certainly benign and incidental
sequelae of infection or inflammation. No follow-up recommended.
This recommendation follows the consensus statement: Guidelines for
Management of Incidental Pulmonary Nodules Detected on CT Images:

## 2023-01-22 NOTE — Progress Notes (Unsigned)
  [name] [address] [place] [address] Phone: [phone] Subjective:   Bruce Donath, am serving as a scribe for Dr. Antoine Primas.  I'm seeing this patient by the request  of:  Pincus Sanes, MD  CC: Back and neck pain follow-up  TMA:[id number]  Stacy [name] is a 32 y.o. female coming in with complaint of back and neck pain. OMT on 11/28/2022. Patient states that she is the same as last visit.   Medications patient has been prescribed:   Taking:         Reviewed prior external information including notes and imaging from previsou exam, outside providers and external EMR if available.   As well as notes that were available from care everywhere and other healthcare systems.  Past medical history, social, surgical and family history all reviewed in electronic medical record.  No pertanent information unless stated regarding to the chief complaint.   Past Medical History:  Diagnosis Date   Anemia    Hyperlipidemia     Allergies  Allergen Reactions   Doxycycline Other (See Comments)    Decreased WBC, Neutropenia     Review of Systems:  No headache, visual changes, nausea, vomiting, diarrhea, constipation, dizziness, abdominal pain, skin rash, fevers, chills, night sweats, weight loss, swollen lymph nodes, body aches, joint swelling, chest pain, shortness of breath, mood changes. POSITIVE muscle aches  Objective  Blood pressure 120/84, pulse 74, height 5\' 4"  (1.626 m), weight 146 lb (66.2 kg), SpO2 98 %.   General: No apparent distress alert and oriented x3 mood and affect normal, dressed appropriately.  HEENT: Pupils equal, extraocular movements intact  Respiratory: Patient's speak in full sentences and does not appear short of breath  Cardiovascular: No lower extremity edema, non tender, no erythema  Hypermobility still noted.  Neck exam does have some mild loss of lordosis and some loss of left-sided sidebending and  right-sided rotation.  Tightness in the right parascapular area  Osteopathic findings  C2 flexed rotated and side bent right C3 flexed rotated and side bent left C6 flexed rotated and side bent left C7 flexed rotated and side bent right T3 extended rotated and side bent right inhaled rib T9 extended rotated and side bent right inhaled rib L2 flexed rotated and side bent right Sacrum right on right       Assessment and Plan:  Hypermobility syndrome Hypermobility still noted, discussed icing regimen and home exercises, discussed which activities to do and which ones to avoid, increase activity slowly otherwise.  Follow-up again in 6 to 8 weeks.    Nonallopathic problems  Decision today to treat with OMT was based on Physical Exam  After verbal consent patient was treated with HVLA, ME, FPR techniques in cervical, rib, thoracic, lumbar, and sacral  areas  Patient tolerated the procedure well with improvement in symptoms  Patient given exercises, stretches and lifestyle modifications  See medications in patient instructions if given  Patient will follow up in 6-8 weeks    The above documentation has been reviewed and is accurate and complete Judi Saa, DO          Note: This dictation was prepared with Dragon dictation along with smaller phrase technology. Any transcriptional errors that result from this process are unintentional.

## 2023-01-23 ENCOUNTER — Ambulatory Visit: Payer: Commercial Managed Care - PPO | Admitting: Family Medicine

## 2023-01-23 ENCOUNTER — Encounter: Payer: Self-pay | Admitting: Family Medicine

## 2023-01-23 VITALS — BP 120/84 | HR 74 | Ht 64.0 in | Wt 146.0 lb

## 2023-01-23 DIAGNOSIS — M9904 Segmental and somatic dysfunction of sacral region: Secondary | ICD-10-CM | POA: Diagnosis not present

## 2023-01-23 DIAGNOSIS — M9902 Segmental and somatic dysfunction of thoracic region: Secondary | ICD-10-CM

## 2023-01-23 DIAGNOSIS — M357 Hypermobility syndrome: Secondary | ICD-10-CM | POA: Diagnosis not present

## 2023-01-23 DIAGNOSIS — M9908 Segmental and somatic dysfunction of rib cage: Secondary | ICD-10-CM

## 2023-01-23 DIAGNOSIS — M9901 Segmental and somatic dysfunction of cervical region: Secondary | ICD-10-CM | POA: Diagnosis not present

## 2023-01-23 DIAGNOSIS — M9903 Segmental and somatic dysfunction of lumbar region: Secondary | ICD-10-CM

## 2023-01-23 NOTE — Patient Instructions (Signed)
Always great to see you See me again in 7-8 weeks

## 2023-01-23 NOTE — Assessment & Plan Note (Signed)
Hypermobility still noted, discussed icing regimen and home exercises, discussed which activities to do and which ones to avoid, increase activity slowly otherwise.  Follow-up again in 6 to 8 weeks.

## 2023-02-12 ENCOUNTER — Other Ambulatory Visit: Payer: Self-pay | Admitting: Family Medicine

## 2023-02-12 ENCOUNTER — Encounter: Payer: Self-pay | Admitting: Allergy

## 2023-02-13 ENCOUNTER — Other Ambulatory Visit: Payer: Self-pay

## 2023-02-13 MED ORDER — LEVOCETIRIZINE DIHYDROCHLORIDE 5 MG PO TABS
5.0000 mg | ORAL_TABLET | Freq: Every evening | ORAL | 1 refills | Status: DC
  Filled 2023-02-13: qty 90, 90d supply, fill #0
  Filled 2023-09-21: qty 90, 90d supply, fill #1

## 2023-02-13 MED ORDER — TIZANIDINE HCL 4 MG PO TABS
4.0000 mg | ORAL_TABLET | Freq: Every day | ORAL | 0 refills | Status: DC
  Filled 2023-02-13: qty 30, 30d supply, fill #0

## 2023-03-19 ENCOUNTER — Other Ambulatory Visit: Payer: Self-pay

## 2023-03-19 DIAGNOSIS — Z6825 Body mass index (BMI) 25.0-25.9, adult: Secondary | ICD-10-CM | POA: Diagnosis not present

## 2023-03-19 DIAGNOSIS — Z309 Encounter for contraceptive management, unspecified: Secondary | ICD-10-CM | POA: Diagnosis not present

## 2023-03-19 DIAGNOSIS — Z01419 Encounter for gynecological examination (general) (routine) without abnormal findings: Secondary | ICD-10-CM | POA: Diagnosis not present

## 2023-03-19 MED ORDER — ETONOGESTREL-ETHINYL ESTRADIOL 0.12-0.015 MG/24HR VA RING
1.0000 | VAGINAL_RING | Freq: Once | VAGINAL | 4 refills | Status: DC
  Filled 2023-03-19 – 2023-04-25 (×3): qty 3, 84d supply, fill #0
  Filled 2023-08-07: qty 3, 84d supply, fill #1
  Filled 2023-10-11: qty 3, 84d supply, fill #2
  Filled 2024-01-13: qty 3, 84d supply, fill #3

## 2023-03-19 NOTE — Progress Notes (Unsigned)
[name] [address] [place] [address] Phone: [phone] Subjective:   Stacy [name], am serving as a scribe for Dr. Antoine Primas.  I'm seeing this patient by the request  of:  Pincus Sanes, MD  CC: Neck and back pain follow-up  MWU:[id number]  Stacy [name] is a 32 y.o. female coming in with complaint of back and neck pain. OMT on 01/23/2023. Patient states that her neck on the L side has been tight since last visit. Denies any radiating symptoms. Has been getting headaches. Has been using tiger balm, heat and zanaflex.  Has had more tightness than usual.  Medications patient has been prescribed: Zanaflex  Taking: Yes         Reviewed prior external information including notes and imaging from previsou exam, outside providers and external EMR if available.   As well as notes that were available from care everywhere and other healthcare systems.  Past medical history, social, surgical and family history all reviewed in electronic medical record.  No pertanent information unless stated regarding to the chief complaint.   Past Medical History:  Diagnosis Date   Anemia    Hyperlipidemia     Allergies  Allergen Reactions   Doxycycline Other (See Comments)    Decreased WBC, Neutropenia     Review of Systems:  No headache, visual changes, nausea, vomiting, diarrhea, constipation, dizziness, abdominal pain, skin rash, fevers, chills, night sweats, weight loss, swollen lymph nodes, body aches, joint swelling, chest pain, shortness of breath, mood changes. POSITIVE muscle aches  Objective  Blood pressure 120/88, pulse 97, height 5\' 4"  (1.626 m), weight 147 lb (66.7 kg), SpO2 97 %.   General: No apparent distress alert and oriented x3 mood and affect normal, dressed appropriately.  HEENT: Pupils equal, extraocular movements intact  Respiratory: Patient's speak in full sentences and does not appear short of breath   Cardiovascular: No lower extremity edema, non tender, no erythema    No edema and increasing left-sided neck tightness.  Patient does have tightness with trigger points noted in the left shoulder area including the trapezius, rhomboid and levator scapula  Osteopathic findings  C2 flexed rotated and side bent left C6 flexed rotated and side bent left T3 extended rotated and side bent left inhaled rib T9 extended rotated and side bent left inhaled. L2 flexed rotated and side bent right Sacrum right on right   With a 25-gauge half inch needle after verbal consent patient was prepped with a alcohol swab and given an injection of 2 cc of 0.5% Marcaine and 1 cc of Kenalog 40 mg/mL.  Distinct trigger points in the levator scapula, rhomboid and trapezius muscle.  Minimal blood loss.  Band-Aids placed.  Postinjection instructions given.    Assessment and Plan:  Slipped rib syndrome Continue supportive syndrome and does have an exacerbation noted today.  Discussed icing regimen and home exercises.  Discussed the stability exercises that I think will be beneficial.  Follow-up again in 6 to 8 weeks  Trigger point of left shoulder region Patient given injection today, tolerated the procedure well, was in no significant discomfort on the way out.  Discussed with patient about icing regimen and home exercises, discussed which activities to do and which ones to avoid.  Increase activity slowly.  Follow-up again in 6 weeks or call me sooner if not making significant improvement     Nonallopathic problems  Decision today to treat with OMT was based on Physical Exam  After verbal consent patient was treated with HVLA, ME, FPR techniques in cervical, rib, thoracic, lumbar, and sacral  areas  Patient tolerated the procedure well with improvement in symptoms  Patient given exercises, stretches and lifestyle modifications  See medications in patient instructions if given  Patient will follow up in  4-8 weeks     The above documentation has been reviewed and is accurate and complete Judi Saa, DO         Note: This dictation was prepared with Dragon dictation along with smaller phrase technology. Any transcriptional errors that result from this process are unintentional.

## 2023-03-20 ENCOUNTER — Encounter: Payer: Self-pay | Admitting: Family Medicine

## 2023-03-20 ENCOUNTER — Ambulatory Visit: Payer: Commercial Managed Care - PPO | Admitting: Family Medicine

## 2023-03-20 VITALS — BP 120/88 | HR 97 | Ht 64.0 in | Wt 147.0 lb

## 2023-03-20 DIAGNOSIS — M9901 Segmental and somatic dysfunction of cervical region: Secondary | ICD-10-CM | POA: Diagnosis not present

## 2023-03-20 DIAGNOSIS — M9904 Segmental and somatic dysfunction of sacral region: Secondary | ICD-10-CM

## 2023-03-20 DIAGNOSIS — M9908 Segmental and somatic dysfunction of rib cage: Secondary | ICD-10-CM | POA: Diagnosis not present

## 2023-03-20 DIAGNOSIS — M9902 Segmental and somatic dysfunction of thoracic region: Secondary | ICD-10-CM | POA: Diagnosis not present

## 2023-03-20 DIAGNOSIS — M9903 Segmental and somatic dysfunction of lumbar region: Secondary | ICD-10-CM | POA: Diagnosis not present

## 2023-03-20 DIAGNOSIS — M25512 Pain in left shoulder: Secondary | ICD-10-CM | POA: Diagnosis not present

## 2023-03-20 DIAGNOSIS — M940 Chondrocostal junction syndrome [Tietze]: Secondary | ICD-10-CM | POA: Diagnosis not present

## 2023-03-20 NOTE — Assessment & Plan Note (Signed)
Continue supportive syndrome and does have an exacerbation noted today.  Discussed icing regimen and home exercises.  Discussed the stability exercises that I think will be beneficial.  Follow-up again in 6 to 8 weeks

## 2023-03-20 NOTE — Patient Instructions (Addendum)
Trigger point injections today See me again in 6-8 weeks

## 2023-03-20 NOTE — Assessment & Plan Note (Signed)
Patient given injection today, tolerated the procedure well, was in no significant discomfort on the way out.  Discussed with patient about icing regimen and home exercises, discussed which activities to do and which ones to avoid.  Increase activity slowly.  Follow-up again in 6 weeks or call me sooner if not making significant improvement

## 2023-04-09 ENCOUNTER — Other Ambulatory Visit: Payer: Self-pay | Admitting: Internal Medicine

## 2023-04-09 ENCOUNTER — Other Ambulatory Visit: Payer: Self-pay

## 2023-04-10 ENCOUNTER — Other Ambulatory Visit (HOSPITAL_COMMUNITY): Payer: Self-pay

## 2023-04-10 ENCOUNTER — Other Ambulatory Visit: Payer: Self-pay

## 2023-04-10 MED ORDER — LORAZEPAM 0.5 MG PO TABS
0.5000 mg | ORAL_TABLET | Freq: Every evening | ORAL | 0 refills | Status: DC | PRN
  Filled 2023-04-10: qty 30, 30d supply, fill #0

## 2023-04-24 ENCOUNTER — Other Ambulatory Visit: Payer: Self-pay | Admitting: Oncology

## 2023-04-24 ENCOUNTER — Other Ambulatory Visit: Payer: Self-pay

## 2023-04-24 DIAGNOSIS — Z006 Encounter for examination for normal comparison and control in clinical research program: Secondary | ICD-10-CM

## 2023-04-25 ENCOUNTER — Other Ambulatory Visit: Payer: Self-pay

## 2023-04-29 ENCOUNTER — Other Ambulatory Visit
Admission: RE | Admit: 2023-04-29 | Discharge: 2023-04-29 | Disposition: A | Discharge status: Home / Self Care | Payer: Commercial Managed Care - PPO | Attending: Oncology | Admitting: Oncology

## 2023-04-29 DIAGNOSIS — Z006 Encounter for examination for normal comparison and control in clinical research program: Secondary | ICD-10-CM | POA: Insufficient documentation

## 2023-05-01 NOTE — Progress Notes (Signed)
  [name] [address] [place] [address] Phone: [phone] Subjective:    I'm seeing this patient by the request  of:  Pincus Sanes, MD  CC: Back pain and neck pain follow-up  ONG:[id number]  Stacy [name] is a 32 y.o. female coming in with complaint of back and neck pain. OMT on 03/20/2023. Patient states more on the left side and neck pain noted today.  Describes the pain as a dull, throbbing aching discomfort.  Patient states that is not stopping her from activity but still gives him some discomfort.  Medications patient has been prescribed:   Taking:         Reviewed prior external information including notes and imaging from previsou exam, outside providers and external EMR if available.   As well as notes that were available from care everywhere and other healthcare systems.  Past medical history, social, surgical and family history all reviewed in electronic medical record.  No pertanent information unless stated regarding to the chief complaint.   Past Medical History:  Diagnosis Date   Anemia    Hyperlipidemia     Allergies  Allergen Reactions   Doxycycline Other (See Comments)    Decreased WBC, Neutropenia     Review of Systems:  No headache, visual changes, nausea, vomiting, diarrhea, constipation, dizziness, abdominal pain, skin rash, fevers, chills, night sweats, weight loss, swollen lymph nodes, body aches, joint swelling, chest pain, shortness of breath, mood changes. POSITIVE muscle aches  Objective  Height 5\' 4"  (1.626 m).   General: No apparent distress alert and oriented x3 mood and affect normal, dressed appropriately.  HEENT: Pupils equal, extraocular movements intact  Respiratory: Patient's speak in full sentences and does not appear short of breath  Cardiovascular: No lower extremity edema, non tender, no erythema  Neck exam does have tightness noted on the left side of the neck.  Seems to give her  some difficulty with sidebending.  Seems to go down the scapular area.  Osteopathic findings  C2 flexed rotated and side bent right C6 flexed rotated and side bent left T3 extended rotated and side bent left inhaled rib T9 extended rotated and side bent left inhaled rib L2 flexed rotated and side bent right Sacrum right on right       Assessment and Plan:  Slipped rib syndrome Continue work on Psychologist, forensic, discussed with patient icing regimen and home exercises, discussed which activities to do and which ones to avoid.  Increase activity slowly.  Follow-up again in 6 to 8 weeks otherwise.    Nonallopathic problems  Decision today to treat with OMT was based on Physical Exam  After verbal consent patient was treated with HVLA, ME, FPR techniques in cervical, rib, thoracic, lumbar, and sacral  areas  Patient tolerated the procedure well with improvement in symptoms  Patient given exercises, stretches and lifestyle modifications  See medications in patient instructions if given  Patient will follow up in 4-8 weeks     The above documentation has been reviewed and is accurate and complete Judi Saa, DO         Note: This dictation was prepared with Dragon dictation along with smaller phrase technology. Any transcriptional errors that result from this process are unintentional.

## 2023-05-08 ENCOUNTER — Encounter: Payer: No Typology Code available for payment source | Admitting: Internal Medicine

## 2023-05-08 ENCOUNTER — Encounter: Payer: Self-pay | Admitting: Family Medicine

## 2023-05-08 ENCOUNTER — Ambulatory Visit: Payer: Commercial Managed Care - PPO | Admitting: Family Medicine

## 2023-05-08 VITALS — Ht 64.0 in

## 2023-05-08 DIAGNOSIS — M9904 Segmental and somatic dysfunction of sacral region: Secondary | ICD-10-CM | POA: Diagnosis not present

## 2023-05-08 DIAGNOSIS — M9908 Segmental and somatic dysfunction of rib cage: Secondary | ICD-10-CM

## 2023-05-08 DIAGNOSIS — M9901 Segmental and somatic dysfunction of cervical region: Secondary | ICD-10-CM

## 2023-05-08 DIAGNOSIS — M9903 Segmental and somatic dysfunction of lumbar region: Secondary | ICD-10-CM

## 2023-05-08 DIAGNOSIS — M940 Chondrocostal junction syndrome [Tietze]: Secondary | ICD-10-CM | POA: Diagnosis not present

## 2023-05-08 DIAGNOSIS — M9902 Segmental and somatic dysfunction of thoracic region: Secondary | ICD-10-CM | POA: Diagnosis not present

## 2023-05-08 NOTE — Patient Instructions (Signed)
Good to see you See me again in 2 months 

## 2023-05-08 NOTE — Assessment & Plan Note (Signed)
Continue work on Psychologist, forensic, discussed with patient icing regimen and home exercises, discussed which activities to do and which ones to avoid.  Increase activity slowly.  Follow-up again in 6 to 8 weeks otherwise.

## 2023-05-21 ENCOUNTER — Encounter: Payer: Self-pay | Admitting: Internal Medicine

## 2023-05-21 NOTE — Patient Instructions (Addendum)

## 2023-05-21 NOTE — Progress Notes (Unsigned)
Subjective:    Patient ID: Stacy [name], female    DOB: [birth year], 32 y.o.   MRN: [id number]      HPI Shaunelle is here for a Physical exam and her chronic medical problems.    Overall doing well.  Some increase in stress-mother was diagnosed with kidney cancer.  Medications and allergies reviewed with patient and updated if appropriate.  Current Outpatient Medications on File Prior to Visit  Medication Sig Dispense Refill   albuterol (VENTOLIN HFA) 108 (90 Base) MCG/ACT inhaler Inhale 2 puffs into the lungs every 6 (six) hours as needed for wheezing or shortness of breath. 6.7 g 1   Ferrous Gluconate 324 (37.5 Fe) MG TABS      ketoconazole (NIZORAL) 2 % shampoo Apply 1 Application topically 2 (two) times a week. 120 mL 5   levocetirizine (XYZAL) 5 MG tablet Take 1 tablet (5 mg total) by mouth every evening. 90 tablet 1   LORazepam (ATIVAN) 0.5 MG tablet Take 1 tablet (0.5 mg total) by mouth at bedtime as needed for anxiety or sleep. 30 tablet 0   Olopatadine HCl 0.2 % SOLN Place one drop per eye once daily as needed for itchy/watery eyes 2.5 mL 2   Olopatadine-Mometasone (RYALTRIS) 665-25 MCG/ACT SUSP Place 2 sprays into the nose daily in the afternoon. 1-2 times daily 29 g 2   propranolol (INDERAL) 10 MG tablet TAKE 1 TABLET BY MOUTH DAILY AS NEEDED (PRESENTATIONS). 30 tablet 0   scopolamine (TRANSDERM-SCOP) 1 MG/3DAYS Place 1 patch (1.5 mg total) onto the skin every 3 (three) days. 10 patch 0   tiZANidine (ZANAFLEX) 4 MG tablet Take 1 tablet (4 mg total) by mouth at bedtime. 30 tablet 0   vitamin C (ASCORBIC ACID) 500 MG tablet      etonogestrel-ethinyl estradiol (NUVARING) 0.12-0.015 MG/24HR vaginal ring Place 1 each vaginally once every 4 weeks; leave in for 3 weeks and remove for 1 week or as directed 3 each 4   No current facility-administered medications on file prior to visit.    Review of Systems  Constitutional:  Negative for fever.  Eyes:  Negative for visual  disturbance.  Respiratory:  Negative for cough, shortness of breath and wheezing.   Cardiovascular:  Positive for palpitations (occ - with anxiety). Negative for chest pain and leg swelling.  Gastrointestinal:  Negative for abdominal pain, blood in stool, constipation and diarrhea.       Occ  gerd  Genitourinary:  Negative for dysuria.  Musculoskeletal:  Positive for neck pain. Negative for arthralgias and back pain.  Skin:  Negative for rash.  Neurological:  Positive for headaches (occ from neck , shoulder pain). Negative for light-headedness.  Psychiatric/Behavioral:  Negative for dysphoric mood. The patient is nervous/anxious.        Objective:   Vitals:   05/22/23 0912  BP: 114/72  Pulse: 95  Temp: 98 F (36.7 C)  SpO2: 96%   Filed Weights   05/22/23 0912  Weight: 146 lb (66.2 kg)   Body mass index is 25.06 kg/m.  BP Readings from Last 3 Encounters:  05/22/23 114/72  03/20/23 120/88  01/23/23 120/84    Wt Readings from Last 3 Encounters:  05/22/23 146 lb (66.2 kg)  03/20/23 147 lb (66.7 kg)  01/23/23 146 lb (66.2 kg)       Physical Exam Constitutional: She appears well-developed and well-nourished. No distress.  HENT:  Head: Normocephalic and atraumatic.  Right Ear: External ear normal. Normal ear canal  and TM Left Ear: External ear normal.  Normal ear canal and TM Mouth/Throat: Oropharynx is clear and moist.  Eyes: Conjunctivae normal.  Neck: Neck supple. No tracheal deviation present. No thyromegaly present.  No carotid bruit  Cardiovascular: Normal rate, regular rhythm and normal heart sounds.   No murmur heard.  No edema. Pulmonary/Chest: Effort normal and breath sounds normal. No respiratory distress. She has no wheezes. She has no rales.  Breast: deferred   Abdominal: Soft. She exhibits no distension. There is no tenderness.  Lymphadenopathy: She has no cervical adenopathy.  Skin: Skin is warm and dry. She is not diaphoretic.  Psychiatric: She  has a normal mood and affect. Her behavior is normal.     Lab Results  Component Value Date   WBC 3.5 (L) 02/14/2022   HGB 13.5 02/14/2022   HCT 40.1 02/14/2022   PLT 290.0 02/14/2022   GLUCOSE 85 06/08/2022   CHOL 223 (H) 06/08/2022   TRIG 113.0 06/08/2022   HDL 81.90 06/08/2022   LDLCALC 118 (H) 06/08/2022   ALT 7 06/08/2022   AST 13 06/08/2022   NA 137 06/08/2022   K 4.0 06/08/2022   CL 103 06/08/2022   CREATININE 0.80 06/08/2022   BUN 9 06/08/2022   CO2 27 06/08/2022   TSH 1.81 06/08/2022   HGBA1C 4.9 04/22/2020         Assessment & Plan:   Physical exam: Screening blood work  ordered Exercise  does dance classes 2-3 times a week Weight  normal Substance abuse  none   Reviewed recommended immunizations.   Health Maintenance  Topic Date Due   PAP SMEAR-Modifier  02/22/2022   COVID-19 Vaccine (6 - 2023-24 season) 06/15/2022   INFLUENZA VACCINE  01/13/2024 (Originally 05/16/2023)   DTaP/Tdap/Td (3 - Td or Tdap) 04/06/2031   Hepatitis C Screening  Completed   HIV Screening  Completed   HPV VACCINES  Aged Out          See Problem List for Assessment and Plan of chronic medical problems.

## 2023-05-22 ENCOUNTER — Encounter: Payer: Self-pay | Admitting: Internal Medicine

## 2023-05-22 ENCOUNTER — Other Ambulatory Visit: Payer: Self-pay

## 2023-05-22 ENCOUNTER — Ambulatory Visit (INDEPENDENT_AMBULATORY_CARE_PROVIDER_SITE_OTHER): Payer: Commercial Managed Care - PPO | Admitting: Internal Medicine

## 2023-05-22 VITALS — BP 114/72 | HR 95 | Temp 98.0°F | Ht 64.0 in | Wt 146.0 lb

## 2023-05-22 DIAGNOSIS — F419 Anxiety disorder, unspecified: Secondary | ICD-10-CM | POA: Diagnosis not present

## 2023-05-22 DIAGNOSIS — E7849 Other hyperlipidemia: Secondary | ICD-10-CM

## 2023-05-22 DIAGNOSIS — F32A Depression, unspecified: Secondary | ICD-10-CM

## 2023-05-22 DIAGNOSIS — Z0000 Encounter for general adult medical examination without abnormal findings: Secondary | ICD-10-CM

## 2023-05-22 DIAGNOSIS — G479 Sleep disorder, unspecified: Secondary | ICD-10-CM | POA: Diagnosis not present

## 2023-05-22 DIAGNOSIS — F418 Other specified anxiety disorders: Secondary | ICD-10-CM | POA: Diagnosis not present

## 2023-05-22 LAB — COMPREHENSIVE METABOLIC PANEL
ALT: 10 U/L (ref 0–35)
AST: 19 U/L (ref 0–37)
Albumin: 4.5 g/dL (ref 3.5–5.2)
Alkaline Phosphatase: 45 U/L (ref 39–117)
BUN: 9 mg/dL (ref 6–23)
CO2: 28 mEq/L (ref 19–32)
Calcium: 9.8 mg/dL (ref 8.4–10.5)
Chloride: 101 mEq/L (ref 96–112)
Creatinine, Ser: 0.86 mg/dL (ref 0.40–1.20)
GFR: 89.59 mL/min (ref >60.00)
Glucose, Bld: 88 mg/dL (ref 70–99)
Potassium: 4.1 mEq/L (ref 3.5–5.1)
Sodium: 136 mEq/L (ref 135–145)
Total Bilirubin: 0.4 mg/dL (ref 0.2–1.2)
Total Protein: 8.2 g/dL (ref 6.0–8.3)

## 2023-05-22 LAB — CBC WITH DIFFERENTIAL/PLATELET
Basophils Absolute: 0 10*3/uL (ref 0.0–0.1)
Basophils Relative: 1.1 % (ref 0.0–3.0)
Eosinophils Absolute: 0.1 10*3/uL (ref 0.0–0.7)
Eosinophils Relative: 2.2 % (ref 0.0–5.0)
HCT: 42.7 % (ref 36.0–46.0)
Hemoglobin: 13.8 g/dL (ref 12.0–15.0)
Lymphocytes Relative: 43 % (ref 12.0–46.0)
Lymphs Abs: 1.6 10*3/uL (ref 0.7–4.0)
MCHC: 32.4 g/dL (ref 30.0–36.0)
MCV: 88.6 fl (ref 78.0–100.0)
Monocytes Absolute: 0.4 10*3/uL (ref 0.1–1.0)
Monocytes Relative: 10.9 % (ref 3.0–12.0)
Neutro Abs: 1.6 10*3/uL (ref 1.4–7.7)
Neutrophils Relative %: 42.8 % — ABNORMAL LOW (ref 43.0–77.0)
Platelets: 304 10*3/uL (ref 150.0–400.0)
RBC: 4.82 Mil/uL (ref 3.87–5.11)
RDW: 13.6 % (ref 11.5–15.5)
WBC: 3.7 10*3/uL — ABNORMAL LOW (ref 4.0–10.5)

## 2023-05-22 LAB — LIPID PANEL
Cholesterol: 248 mg/dL — ABNORMAL HIGH (ref 0–200)
HDL: 81 mg/dL (ref >39.00)
LDL Cholesterol: 152 mg/dL — ABNORMAL HIGH (ref 0–99)
NonHDL: 167.19
Total CHOL/HDL Ratio: 3
Triglycerides: 77 mg/dL (ref 0.0–149.0)
VLDL: 15.4 mg/dL (ref 0.0–40.0)

## 2023-05-22 LAB — TSH: TSH: 1.89 u[IU]/mL (ref 0.35–5.50)

## 2023-05-22 MED ORDER — LORAZEPAM 0.5 MG PO TABS
0.5000 mg | ORAL_TABLET | Freq: Every evening | ORAL | 0 refills | Status: DC | PRN
  Filled 2023-05-22: qty 30, 30d supply, fill #0

## 2023-05-22 NOTE — Assessment & Plan Note (Signed)
Has anxiety related to presentations Continue propranolol 10 mg daily as needed for presentations

## 2023-05-22 NOTE — Assessment & Plan Note (Signed)
Chronic Was on atorvastatin in the past, but has been able to control with diet and lifestyle Continue regular exercise, healthy diet Check lipids, CMP, TSH

## 2023-05-22 NOTE — Assessment & Plan Note (Signed)
Chronic Intermittent Okay to continue lorazepam 0.5 mg nightly as needed-she does keep this to a minimum-refilled today

## 2023-05-22 NOTE — Assessment & Plan Note (Addendum)
Chronic Has been off of Lexapro and doing well Continue regular exercise  Has SAD and did not require any medication last year, but may need medication in the fall - winter --- consider Wellbutrin

## 2023-05-28 ENCOUNTER — Other Ambulatory Visit: Payer: Self-pay

## 2023-05-29 ENCOUNTER — Other Ambulatory Visit: Payer: Self-pay

## 2023-06-03 DIAGNOSIS — D485 Neoplasm of uncertain behavior of skin: Secondary | ICD-10-CM | POA: Diagnosis not present

## 2023-06-03 DIAGNOSIS — D2262 Melanocytic nevi of left upper limb, including shoulder: Secondary | ICD-10-CM | POA: Diagnosis not present

## 2023-06-03 DIAGNOSIS — L738 Other specified follicular disorders: Secondary | ICD-10-CM | POA: Diagnosis not present

## 2023-06-03 DIAGNOSIS — L821 Other seborrheic keratosis: Secondary | ICD-10-CM | POA: Diagnosis not present

## 2023-06-03 DIAGNOSIS — D224 Melanocytic nevi of scalp and neck: Secondary | ICD-10-CM | POA: Diagnosis not present

## 2023-06-03 DIAGNOSIS — D239 Other benign neoplasm of skin, unspecified: Secondary | ICD-10-CM

## 2023-06-03 HISTORY — DX: Other benign neoplasm of skin, unspecified: D23.9

## 2023-06-13 ENCOUNTER — Telehealth: Payer: Commercial Managed Care - PPO | Admitting: Physician Assistant

## 2023-06-13 DIAGNOSIS — J9801 Acute bronchospasm: Secondary | ICD-10-CM

## 2023-06-13 DIAGNOSIS — U071 COVID-19: Secondary | ICD-10-CM

## 2023-06-13 MED ORDER — METHYLPREDNISOLONE 4 MG PO TBPK
ORAL_TABLET | ORAL | 0 refills | Status: DC
Start: 2023-06-13 — End: 2024-03-25

## 2023-06-13 MED ORDER — ALBUTEROL SULFATE HFA 108 (90 BASE) MCG/ACT IN AERS
2.0000 | INHALATION_SPRAY | Freq: Four times a day (QID) | RESPIRATORY_TRACT | 1 refills | Status: AC | PRN
Start: 2023-06-13 — End: ?

## 2023-06-13 NOTE — Progress Notes (Signed)
Virtual Visit Consent   Stacy [name], you are scheduled for a virtual visit with a [facility] provider today. Just as with appointments in the office, your consent must be obtained to participate. Your consent will be active for this visit and any virtual visit you may have with one of our providers in the next 365 days. If you have a MyChart account, a copy of this consent can be sent to you electronically.  As this is a virtual visit, video technology does not allow for your provider to perform a traditional examination. This may limit your provider's ability to fully assess your condition. If your provider identifies any concerns that need to be evaluated in person or the need to arrange testing (such as labs, EKG, etc.), we will make arrangements to do so. Although advances in technology are sophisticated, we cannot ensure that it will always work on either your end or our end. If the connection with a video visit is poor, the visit may have to be switched to a telephone visit. With either a video or telephone visit, we are not always able to ensure that we have a secure connection.  By engaging in this virtual visit, you consent to the provision of healthcare and authorize for your insurance to be billed (if applicable) for the services provided during this visit. Depending on your insurance coverage, you may receive a charge related to this service.  I need to obtain your verbal consent now. Are you willing to proceed with your visit today? Stacy [name] has provided verbal consent on 06/13/2023 for a virtual visit (video or telephone). Stacy [name], [place]  Date: 06/13/2023 1:34 PM  Virtual Visit via Video Note   I, Stacy [name], connected with  Stacy [name]  ([id number], 1991/07/10) on 06/13/23 at  1:00 PM EDT by a video-enabled telemedicine application and verified that I am speaking with the correct person using two identifiers.  Location: Patient: Virtual Visit Location Patient:  Home Provider: Virtual Visit Location Provider: Home Office   I discussed the limitations of evaluation and management by telemedicine and the availability of in person appointments. The patient expressed understanding and agreed to proceed.    History of Present Illness: Stacy [name] is a 32 y.o. who identifies as a female who was assigned female at birth, and is being seen today for covid related symptoms and mostly viral cough.  HPI: 32 y/o F presents via video visit for headache, sore throat, cough, and congestion x 2 days. She tested POSITIVE for covid earlier today. Doing well. No CP, SOB, or wheezing currently.     Problems:  Patient Active Problem List   Diagnosis Date Noted   Trigger point of left shoulder region 03/20/2023   Heel pain 10/03/2022   Situational anxiety 05/05/2022   Weight gain 05/05/2022   Allergic rhinitis 11/01/2021   Chest pain 04/09/2021   Rib pain on right side 04/05/2021   Family history of diabetes mellitus (DM) 04/22/2020   Anxiety and depression 07/06/2019   Difficulty sleeping 07/06/2019   RUQ pain 04/16/2019   Slipped rib syndrome 06/19/2018   Nonallopathic lesion of sacral region 06/19/2018   Nonallopathic lesion of rib cage 06/19/2018   Palpitations 02/11/2018   Hypermobility syndrome 03/25/2017   Nonallopathic lesion of thoracic region 03/25/2017   Nonallopathic lesion of cervical region 03/25/2017   Nonallopathic lesion of lumbosacral region 03/25/2017   Back pain 01/30/2017   Other hyperlipidemia 01/23/2016   Dandruff 01/23/2016   Iron deficiency anemia 01/23/2016  Allergies:  Allergies  Allergen Reactions   Doxycycline Other (See Comments)    Decreased WBC, Neutropenia   Medications:  Current Outpatient Medications:    albuterol (VENTOLIN HFA) 108 (90 Base) MCG/ACT inhaler, Inhale 2 puffs into the lungs every 6 (six) hours as needed for wheezing or shortness of breath., Disp: 8 g, Rfl: 1   Ferrous Gluconate 324 (37.5 Fe) MG  TABS, , Disp: , Rfl:    ketoconazole (NIZORAL) 2 % shampoo, Apply 1 Application topically 2 (two) times a week., Disp: 120 mL, Rfl: 5   levocetirizine (XYZAL) 5 MG tablet, Take 1 tablet (5 mg total) by mouth every evening., Disp: 90 tablet, Rfl: 1   LORazepam (ATIVAN) 0.5 MG tablet, Take 1 tablet (0.5 mg total) by mouth at bedtime as needed for anxiety or sleep., Disp: 30 tablet, Rfl: 0   methylPREDNISolone (MEDROL DOSEPAK) 4 MG TBPK tablet, Take as directed, Disp: 1 each, Rfl: 0   Olopatadine-Mometasone (RYALTRIS) 665-25 MCG/ACT SUSP, Place 2 sprays into the nose daily in the afternoon. 1-2 times daily, Disp: 29 g, Rfl: 2   propranolol (INDERAL) 10 MG tablet, TAKE 1 TABLET BY MOUTH DAILY AS NEEDED (PRESENTATIONS)., Disp: 30 tablet, Rfl: 0   tiZANidine (ZANAFLEX) 4 MG tablet, Take 1 tablet (4 mg total) by mouth at bedtime., Disp: 30 tablet, Rfl: 0   etonogestrel-ethinyl estradiol (NUVARING) 0.12-0.015 MG/24HR vaginal ring, Place 1 each vaginally once every 4 weeks; leave in for 3 weeks and remove for 1 week or as directed, Disp: 3 each, Rfl: 4   Olopatadine HCl 0.2 % SOLN, Place one drop per eye once daily as needed for itchy/watery eyes (Patient not taking: Reported on 06/13/2023), Disp: 2.5 mL, Rfl: 2   scopolamine (TRANSDERM-SCOP) 1 MG/3DAYS, Place 1 patch (1.5 mg total) onto the skin every 3 (three) days., Disp: 10 patch, Rfl: 0   vitamin C (ASCORBIC ACID) 500 MG tablet, , Disp: , Rfl:   Observations/Objective: Patient is well-developed, well-nourished in no acute distress.  Resting comfortably  at home.  Head is normocephalic, atraumatic.  No labored breathing.  Speech is clear and coherent with logical content.  Patient is alert and oriented at baseline.    Assessment and Plan: 1. COVID-19 virus infection  2. Bronchospasm - albuterol (VENTOLIN HFA) 108 (90 Base) MCG/ACT inhaler; Inhale 2 puffs into the lungs every 6 (six) hours as needed for wheezing or shortness of breath.  Dispense:  8 g; Refill: 1 - methylPREDNISolone (MEDROL DOSEPAK) 4 MG TBPK tablet; Take as directed  Dispense: 1 each; Refill: 0  Increase fluids Take medicines as prescribed. Continue to watch for worsening symptoms.  Reviewed CDC guidelines with patient. Pt will be isolated for 5 days from the start of symptoms. Needs to be fever free for 24 hours without the aid of medicine before returning back to work. Must wear a mask for an additional 5 days when at workplace.     Follow Up Instructions: I discussed the assessment and treatment plan with the patient. The patient was provided an opportunity to ask questions and all were answered. The patient agreed with the plan and demonstrated an understanding of the instructions.  A copy of instructions were sent to the patient via MyChart unless otherwise noted below.    The patient was advised to call back or seek an in-person evaluation if the symptoms worsen or if the condition fails to improve as anticipated.  Time:  I spent 10 minutes with the patient via telehealth technology  discussing the above problems/concerns.    Stacy Better, PA-C

## 2023-06-13 NOTE — Patient Instructions (Signed)
Stacy [name], thank you for joining Gilberto Better, PA-C for today's virtual visit.  While this provider is not your primary care provider (PCP), if your PCP is located in our provider database this encounter information will be shared with them immediately following your visit.   A [facility] MyChart account gives you access to today's visit and all your visits, tests, and labs performed at [facility] " click here if you don't have a [facility] MyChart account or go to [email or web address]  Consent: (Patient) Stacy [name] provided verbal consent for this virtual visit at the beginning of the encounter.  Current Medications:  Current Outpatient Medications:    albuterol (VENTOLIN HFA) 108 (90 Base) MCG/ACT inhaler, Inhale 2 puffs into the lungs every 6 (six) hours as needed for wheezing or shortness of breath., Disp: 8 g, Rfl: 1   Ferrous Gluconate 324 (37.5 Fe) MG TABS, , Disp: , Rfl:    ketoconazole (NIZORAL) 2 % shampoo, Apply 1 Application topically 2 (two) times a week., Disp: 120 mL, Rfl: 5   levocetirizine (XYZAL) 5 MG tablet, Take 1 tablet (5 mg total) by mouth every evening., Disp: 90 tablet, Rfl: 1   LORazepam (ATIVAN) 0.5 MG tablet, Take 1 tablet (0.5 mg total) by mouth at bedtime as needed for anxiety or sleep., Disp: 30 tablet, Rfl: 0   methylPREDNISolone (MEDROL DOSEPAK) 4 MG TBPK tablet, Take as directed, Disp: 1 each, Rfl: 0   Olopatadine-Mometasone (RYALTRIS) 665-25 MCG/ACT SUSP, Place 2 sprays into the nose daily in the afternoon. 1-2 times daily, Disp: 29 g, Rfl: 2   propranolol (INDERAL) 10 MG tablet, TAKE 1 TABLET BY MOUTH DAILY AS NEEDED (PRESENTATIONS)., Disp: 30 tablet, Rfl: 0   tiZANidine (ZANAFLEX) 4 MG tablet, Take 1 tablet (4 mg total) by mouth at bedtime., Disp: 30 tablet, Rfl: 0   etonogestrel-ethinyl estradiol (NUVARING) 0.12-0.015 MG/24HR vaginal ring, Place 1 each vaginally once every 4 weeks; leave in for 3 weeks and remove for 1 week or as  directed, Disp: 3 each, Rfl: 4   Olopatadine HCl 0.2 % SOLN, Place one drop per eye once daily as needed for itchy/watery eyes (Patient not taking: Reported on 06/13/2023), Disp: 2.5 mL, Rfl: 2   scopolamine (TRANSDERM-SCOP) 1 MG/3DAYS, Place 1 patch (1.5 mg total) onto the skin every 3 (three) days., Disp: 10 patch, Rfl: 0   vitamin C (ASCORBIC ACID) 500 MG tablet, , Disp: , Rfl:    Medications ordered in this encounter:  Meds ordered this encounter  Medications   albuterol (VENTOLIN HFA) 108 (90 Base) MCG/ACT inhaler    Sig: Inhale 2 puffs into the lungs every 6 (six) hours as needed for wheezing or shortness of breath.    Dispense:  8 g    Refill:  1    Order Specific Question:   Supervising Provider    Answer:   Merrilee Jansky [[id number]]   methylPREDNISolone (MEDROL DOSEPAK) 4 MG TBPK tablet    Sig: Take as directed    Dispense:  1 each    Refill:  0    Order Specific Question:   Supervising Provider    Answer:   Merrilee Jansky [id number]     *If you need refills on other medications prior to your next appointment, please contact your pharmacy*  Follow-Up: Call back or seek an in-person evaluation if the symptoms worsen or if the condition fails to improve as anticipated.  [facility] [phone]  Other Instructions Increase fluids  Take medicines as prescribed. Continue to watch for worsening symptoms.  Reviewed CDC guidelines with patient. Pt will be isolated for 5 days from the start of symptoms. Needs to be fever free for 24 hours without the aid of medicine before returning back to work. Must wear a mask for an additional 5 days when at workplace.     If you have been instructed to have an in-person evaluation today at a local Urgent Care facility, please use the link below. It will take you to a list of all of our available [facility], including address, phone number and hours of operation. Please do not delay care.  [facility]  If you or a family member do not have a primary care provider, use the link below to schedule a visit and establish care. When you choose a [facility] primary care physician or advanced practice provider, you gain a long-term partner in health. Find a Primary Care Provider  Learn more about [facility] in-office and virtual care options: [facility] - Get Care Now

## 2023-06-21 ENCOUNTER — Other Ambulatory Visit: Payer: Self-pay

## 2023-07-09 NOTE — Progress Notes (Unsigned)
[name] [address] [place] [address] Phone: [phone] Subjective:   Stacy [name], am serving as a scribe for Dr. Antoine Primas.  I'm seeing this patient by the request  of:  Pincus Sanes, MD  CC: Back and neck pain,  [id number]  Stacy [name] is a 32 y.o. female coming in with complaint of back and neck pain. OMT on 05/08/2023. Patient states that she has had a stressful 3 weeks. Pain in L shoulder, scapula and c spine.   Medications patient has been prescribed:   Taking:     Reviewing patient's chart did have a COVID infection in August    Reviewed prior external information including notes and imaging from previsou exam, outside providers and external EMR if available.   As well as notes that were available from care everywhere and other healthcare systems.  Past medical history, social, surgical and family history all reviewed in electronic medical record.  No pertanent information unless stated regarding to the chief complaint.   Past Medical History:  Diagnosis Date   Anemia    Hyperlipidemia     Allergies  Allergen Reactions   Doxycycline Other (See Comments)    Decreased WBC, Neutropenia     Review of Systems:  No headache, visual changes, nausea, vomiting, diarrhea, constipation, dizziness, abdominal pain, skin rash, fevers, chills, night sweats, weight loss, swollen lymph nodes, body aches, joint swelling, chest pain, shortness of breath, mood changes. POSITIVE muscle aches  Objective  Blood pressure (!) 120/92, pulse 85, height 5\' 4"  (1.626 m), weight 139 lb (63 kg), SpO2 98%.   General: No apparent distress alert and oriented x3 mood and affect normal, dressed appropriately.  HEENT: Pupils equal, extraocular movements intact  Respiratory: Patient's speak in full sentences and does not appear short of breath  Cardiovascular: No lower extremity edema, non tender, no erythema  MSK:  Back hypermobility  noted.  Neck exam still has significant tightness on the right side.  Does have still pain in the right parascapular area as well.  Some tightness with some mild trigger points noted on the left side trapezius muscle.  Osteopathic findings  C3 flexed rotated and side bent right C7 flexed rotated and side bent left T3 extended rotated and side bent left inhaled rib T7 extended rotated and side bent left L2 flexed rotated and side bent right Sacrum right on right    Assessment and Plan:  Slipped rib syndrome Had his lipids and have some tightness noted as well.  Discussed icing regimen and home exercises.  Patient will be traveling to see her after all the travel.  Would give her refills of any medicine but at the moment not taking anything on a regular basis.  Follow-up again in 6 to 8 weeks    Nonallopathic problems  Decision today to treat with OMT was based on Physical Exam  After verbal consent patient was treated with HVLA, ME, FPR techniques in cervical, rib, thoracic, lumbar, and sacral  areas  Patient tolerated the procedure well with improvement in symptoms  Patient given exercises, stretches and lifestyle modifications  See medications in patient instructions if given  Patient will follow up in 4-8 weeks    The above documentation has been reviewed and is accurate and complete Judi Saa, DO          Note: This dictation was prepared with Dragon dictation along with smaller phrase technology. Any transcriptional errors that result from this  process are unintentional.

## 2023-07-10 ENCOUNTER — Ambulatory Visit: Payer: Commercial Managed Care - PPO | Admitting: Family Medicine

## 2023-07-10 ENCOUNTER — Encounter: Payer: Self-pay | Admitting: Family Medicine

## 2023-07-10 VITALS — BP 120/92 | HR 85 | Ht 64.0 in | Wt 139.0 lb

## 2023-07-10 DIAGNOSIS — M9902 Segmental and somatic dysfunction of thoracic region: Secondary | ICD-10-CM | POA: Diagnosis not present

## 2023-07-10 DIAGNOSIS — M9901 Segmental and somatic dysfunction of cervical region: Secondary | ICD-10-CM

## 2023-07-10 DIAGNOSIS — M9903 Segmental and somatic dysfunction of lumbar region: Secondary | ICD-10-CM

## 2023-07-10 DIAGNOSIS — M940 Chondrocostal junction syndrome [Tietze]: Secondary | ICD-10-CM

## 2023-07-10 DIAGNOSIS — M9908 Segmental and somatic dysfunction of rib cage: Secondary | ICD-10-CM | POA: Diagnosis not present

## 2023-07-10 DIAGNOSIS — M9904 Segmental and somatic dysfunction of sacral region: Secondary | ICD-10-CM | POA: Diagnosis not present

## 2023-07-10 NOTE — Patient Instructions (Signed)
Great to see you [place] See me in 6-8 weeks

## 2023-07-10 NOTE — Assessment & Plan Note (Signed)
Had his lipids and have some tightness noted as well.  Discussed icing regimen and home exercises.  Patient will be traveling to see her after all the travel.  Would give her refills of any medicine but at the moment not taking anything on a regular basis.  Follow-up again in 6 to 8 weeks

## 2023-07-16 DIAGNOSIS — H5213 Myopia, bilateral: Secondary | ICD-10-CM | POA: Diagnosis not present

## 2023-07-22 LAB — HELIX MOLECULAR SCREEN: Genetic Analysis Overall Interpretation: NEGATIVE

## 2023-08-05 DIAGNOSIS — H18891 Other specified disorders of cornea, right eye: Secondary | ICD-10-CM | POA: Diagnosis not present

## 2023-08-05 DIAGNOSIS — H18461 Peripheral corneal degeneration, right eye: Secondary | ICD-10-CM | POA: Diagnosis not present

## 2023-08-05 DIAGNOSIS — H16141 Punctate keratitis, right eye: Secondary | ICD-10-CM | POA: Diagnosis not present

## 2023-08-08 ENCOUNTER — Other Ambulatory Visit: Payer: Self-pay

## 2023-08-27 NOTE — Progress Notes (Unsigned)
[name] [address] [place] [address] Phone: [phone] Subjective:   INadine [name], am serving as a scribe for Dr. Antoine Primas.  I'm seeing this patient by the request  of:  Pincus Sanes, MD  CC: Back and neck pain follow-up  [id number]  Stacy [name] is a 32 y.o. female coming in with complaint of back and neck pain. OMT 07/10/2023. Patient states same per usual. No new concerns.  Medications patient has been prescribed: None  Taking:         Reviewed prior external information including notes and imaging from previsou exam, outside providers and external EMR if available.   As well as notes that were available from care everywhere and other healthcare systems.  Past medical history, social, surgical and family history all reviewed in electronic medical record.  No pertanent information unless stated regarding to the chief complaint.   Past Medical History:  Diagnosis Date   Anemia    Dysplastic nevus 06/03/2023   Right mid central palm/palmar hand - moderate-severe   Hyperlipidemia     Allergies  Allergen Reactions   Doxycycline Other (See Comments)    Decreased WBC, Neutropenia     Review of Systems:  No headache, visual changes, nausea, vomiting, diarrhea, constipation, dizziness, abdominal pain, skin rash, fevers, chills, night sweats, weight loss, swollen lymph nodes, body aches, joint swelling, chest pain, shortness of breath, mood changes. POSITIVE muscle aches  Objective  Blood pressure 124/84, pulse (!) 108, height 5\' 4"  (1.626 m), weight 140 lb (63.5 kg), SpO2 98%.   General: No apparent distress alert and oriented x3 mood and affect normal, dressed appropriately.  HEENT: Pupils equal, extraocular movements intact  Respiratory: Patient's speak in full sentences and does not appear short of breath  Cardiovascular: No lower extremity edema, non tender, no erythema  Gait MSK:  Back does have some  loss lordosis noted.  Hypermobility noted.  More tightness noted in the left side of the parascapular area today.  This is somewhat consistent with what patient has had previously.  Negative Spurling's of the neck noted.  Osteopathic findings  C2 flexed rotated and side bent left C7 flexed rotated and side bent left T3 extended rotated and side bent left inhaled rib T5 extended rotated and side bent left inhaled rib L2 flexed rotated and side bent right Sacrum right on right    Assessment and Plan:  Slipped rib syndrome Chronic, with a mild exacerbation noted.  Some of it seems to be stress related.  Discussed icing regimen and home exercises, discussed which activities to do and which ones to avoid.  Increase activity slowly otherwise.  Patient may have some contributing TMJ that could be potentially contributing.  Discussed possible further evaluation of this.  He is already using a sleep guard at night.  Follow-up again in 6-8 [place]    Nonallopathic problems  Decision today to treat with OMT was based on Physical Exam  After verbal consent patient was treated with HVLA, ME, FPR techniques in cervical, rib, thoracic, lumbar, and sacral  areas  Patient tolerated the procedure well with improvement in symptoms  Patient given exercises, stretches and lifestyle modifications  See medications in patient instructions if given  Patient will follow up in 4-8 weeks     The above documentation has been reviewed and is accurate and complete Judi Saa, DO         Note: This dictation was prepared with Reubin Milan  dictation along with smaller phrase technology. Any transcriptional errors that result from this process are unintentional.

## 2023-08-28 ENCOUNTER — Ambulatory Visit: Payer: Commercial Managed Care - PPO | Admitting: Dermatology

## 2023-08-28 ENCOUNTER — Ambulatory Visit: Payer: Commercial Managed Care - PPO | Admitting: Family Medicine

## 2023-08-28 ENCOUNTER — Encounter: Payer: Self-pay | Admitting: Family Medicine

## 2023-08-28 ENCOUNTER — Encounter: Payer: Self-pay | Admitting: Dermatology

## 2023-08-28 ENCOUNTER — Other Ambulatory Visit: Payer: Self-pay

## 2023-08-28 VITALS — BP 120/75

## 2023-08-28 VITALS — BP 124/84 | HR 108 | Ht 64.0 in | Wt 140.0 lb

## 2023-08-28 DIAGNOSIS — L814 Other melanin hyperpigmentation: Secondary | ICD-10-CM | POA: Diagnosis not present

## 2023-08-28 DIAGNOSIS — M9902 Segmental and somatic dysfunction of thoracic region: Secondary | ICD-10-CM

## 2023-08-28 DIAGNOSIS — L649 Androgenic alopecia, unspecified: Secondary | ICD-10-CM

## 2023-08-28 DIAGNOSIS — M940 Chondrocostal junction syndrome [Tietze]: Secondary | ICD-10-CM

## 2023-08-28 DIAGNOSIS — Z1283 Encounter for screening for malignant neoplasm of skin: Secondary | ICD-10-CM | POA: Diagnosis not present

## 2023-08-28 DIAGNOSIS — L821 Other seborrheic keratosis: Secondary | ICD-10-CM | POA: Diagnosis not present

## 2023-08-28 DIAGNOSIS — D229 Melanocytic nevi, unspecified: Secondary | ICD-10-CM

## 2023-08-28 DIAGNOSIS — L810 Postinflammatory hyperpigmentation: Secondary | ICD-10-CM

## 2023-08-28 DIAGNOSIS — M9901 Segmental and somatic dysfunction of cervical region: Secondary | ICD-10-CM | POA: Diagnosis not present

## 2023-08-28 DIAGNOSIS — M9903 Segmental and somatic dysfunction of lumbar region: Secondary | ICD-10-CM | POA: Diagnosis not present

## 2023-08-28 DIAGNOSIS — D1801 Hemangioma of skin and subcutaneous tissue: Secondary | ICD-10-CM | POA: Diagnosis not present

## 2023-08-28 DIAGNOSIS — Z86018 Personal history of other benign neoplasm: Secondary | ICD-10-CM

## 2023-08-28 DIAGNOSIS — M9908 Segmental and somatic dysfunction of rib cage: Secondary | ICD-10-CM

## 2023-08-28 DIAGNOSIS — D2361 Other benign neoplasm of skin of right upper limb, including shoulder: Secondary | ICD-10-CM

## 2023-08-28 DIAGNOSIS — M9904 Segmental and somatic dysfunction of sacral region: Secondary | ICD-10-CM | POA: Diagnosis not present

## 2023-08-28 DIAGNOSIS — D239 Other benign neoplasm of skin, unspecified: Secondary | ICD-10-CM

## 2023-08-28 DIAGNOSIS — L219 Seborrheic dermatitis, unspecified: Secondary | ICD-10-CM | POA: Diagnosis not present

## 2023-08-28 MED ORDER — ZORYVE 0.3 % EX FOAM
1.0000 | Freq: Every day | CUTANEOUS | 3 refills | Status: AC
Start: 2023-08-28 — End: ?
  Filled 2023-08-28 – 2023-09-03 (×5): qty 60, 30d supply, fill #0
  Filled 2023-10-11: qty 60, 30d supply, fill #1

## 2023-08-28 MED ORDER — SAFETY SEAL MISCELLANEOUS MISC: 1.0000 | Freq: Every morning | 3 refills | Status: DC

## 2023-08-28 NOTE — Patient Instructions (Signed)
Good to see you! Plan a new trip, it will fix everything See you again in 7-8 weeks

## 2023-08-28 NOTE — Assessment & Plan Note (Signed)
Chronic, with a mild exacerbation noted.  Some of it seems to be stress related.  Discussed icing regimen and home exercises, discussed which activities to do and which ones to avoid.  Increase activity slowly otherwise.  Patient may have some contributing TMJ that could be potentially contributing.  Discussed possible further evaluation of this.  He is already using a sleep guard at night.  Follow-up again in 6-8 [place]

## 2023-08-28 NOTE — Patient Instructions (Addendum)
Dear Stacy [name],  Thank you for visiting [place] today. Your dedication to maintaining and improving your skin health is greatly appreciated. Below is a summary of our discussion and the next steps for your treatment plan:  - Shave Excision: We have planned a shave excision to remove the Severely Dysplastic Nevus area on your hand. This procedure is crucial for the complete removal of the affected tissue. Please schedule this at your earliest convenience.  - Hair and Scalp Care:  - DHS Zinc Shampoo: Use at least once a week to manage dandruff and seborrheic dermatitis.   - Hair Care: Gently pull back your hair to minimize tension and breakage.   - Minoxidil with Finasteride: Apply each morning as part of your daily routine to address early androgenetic alopecia. This prescription will be sent to [facility].  - Skin Monitoring:   - Mole and Spot Monitoring: Keep an eye on existing moles and any new or changing spots. Report any significant changes or growth.   - Post-Inflammatory Hyperpigmentation: The hyperpigmentation on your neck is expected to resolve on its own.  - Seborrheic Dermatitis Treatment:   - Zoryve Foam: Use it once daily to scalp and ears during flare-ups.  - Follow-Up Appointment: Let's schedule a follow-up before the end of the year to review your progress and adjust the treatment plan as necessary.  Please do not hesitate to reach out if you have any questions or concerns about your treatment plan. I look forward to seeing the progress at your next appointment.  Warm regards,  Dr. Langston Reusing, Dermatology           Your provider has sent your prescription to [facility] in [place], [place]. A pharmacy representative will call you to confirm details and take your payment information. If you do not receive a call within 24 hours, please contact the pharmacy at [phone] or [phone]. Your unique skincare compound is being formulated in our lab (most  compounds take less than 24 hours). Your prescription is shipped vis USPS to your mailbox (2-4 business days). Priority shipping is available at an additional cost. Once received, you will electronically sign/acknowledge that you received your prescription. The pharmacy hours are Monday-Friday 9 am-6 pm EST and Saturday 9 am-1 pm EST.      Important Information  Due to recent changes in healthcare laws, you may see results of your pathology and/or laboratory studies on MyChart before the doctors have had a chance to review them. We understand that in some cases there may be results that are confusing or concerning to you. Please understand that not all results are received at the same time and often the doctors may need to interpret multiple results in order to provide you with the best plan of care or course of treatment. Therefore, we ask that you please give [place] 2 business days to thoroughly review all your results before contacting the office for clarification. Should we see a critical lab result, you will be contacted sooner.   If You Need Anything After Your Visit  If you have any questions or concerns for your doctor, please call our main line at [phone] If no one answers, please leave a voicemail as directed and we will return your call as soon as possible. Messages left after 4 pm will be answered the following business day.   You may also send [place] a message via MyChart. We typically respond to MyChart messages within 1-2 business days.  For prescription refills, please ask  your pharmacy to contact our office. Our fax number is [phone].  If you have an urgent issue when the clinic is closed that cannot wait until the next business day, you can page your doctor at the number below.    Please note that while we do our best to be available for urgent issues outside of office hours, we are not available 24/7.   If you have an urgent issue and are unable to reach [place], you may choose  to seek medical care at your doctor's office, retail clinic, urgent care center, or emergency room.  If you have a medical emergency, please immediately call 911 or go to the emergency department. In the event of inclement weather, please call our main line at [phone] for an update on the status of any delays or closures.  Dermatology Medication Tips: Please keep the boxes that topical medications come in in order to help keep track of the instructions about where and how to use these. Pharmacies typically print the medication instructions only on the boxes and not directly on the medication tubes.   If your medication is too expensive, please contact our office at [phone] or send [place] a message through MyChart.   We are unable to tell what your co-pay for medications will be in advance as this is different depending on your insurance coverage. However, we may be able to find a substitute medication at lower cost or fill out paperwork to get insurance to cover a needed medication.   If a prior authorization is required to get your medication covered by your insurance company, please allow [place] 1-2 business days to complete this process.  Drug prices often vary depending on where the prescription is filled and some pharmacies may offer cheaper prices.  The website www.goodrx.com contains coupons for medications through different pharmacies. The prices here do not account for what the cost may be with help from insurance (it may be cheaper with your insurance), but the website can give you the price if you did not use any insurance.  - You can print the associated coupon and take it with your prescription to the pharmacy.  - You may also stop by our office during regular business hours and pick up a GoodRx coupon card.  - If you need your prescription sent electronically to a different pharmacy, notify our office through [facility] or by phone at [phone]

## 2023-08-28 NOTE — Progress Notes (Signed)
New Patient Visit   Subjective  Stacy [name] is a 32 y.o. female who presents for the following: Skin Cancer Screening and Full Body Skin Exam - History of Dysplastic nevus. No family of Melanoma.  The patient presents for Total-Body Skin Exam (TBSE) for skin cancer screening and mole check. The patient has spots, moles and lesions to be evaluated, some may be new or changing and the patient may have concern these could be cancer.  Stacy [name], a 32 year old female, presents for a skin examination. She reports that the pigmented area on her hand has disappeared and is currently in the healing process. She denies any new moles since her last visit but expresses concern about existing moles she would like to have checked. Stacy [name] also mentions experiencing thinning hair and has been diagnosed with early androgenetic alopecia. She denies any family history of skin cancer.  The patient reports having a mole on her upper back that has been present for as long as she can remember, which she believes to be a congenital nevus. She also has a skin tag on her neck and a new spot on her skin that initially appeared like a bruise but is now identified as post-inflammatory hyperpigmentation. Stacy [name] mentions having a dilated pore on her back with a small brown spot, which she states has been there for over 10 years.  Additionally, Stacy [name] reports having issues with dandruff, which has improved since she started swimming and washing her hair more frequently. She currently uses Head and Shoulders shampoo and tar shampoo for itchiness.   The following portions of the chart were reviewed this encounter and updated as appropriate: medications, allergies, medical history  Review of Systems:  No other skin or systemic complaints except as noted in HPI or Assessment and Plan.  Objective  Well appearing patient in no apparent distress; mood and affect are within normal limits.  A full examination was performed  including scalp, head, eyes, ears, nose, lips, neck, chest, axillae, abdomen, back, buttocks, bilateral upper extremities, bilateral lower extremities, hands, feet, fingers, toes, fingernails, and toenails. All findings within normal limits unless otherwise noted below.   Relevant physical exam findings are noted in the Assessment and Plan.  Right mid central palm/palmar hand Healing biopsy site with possible repigmentation.    Assessment & Plan   SKIN CANCER SCREENING PERFORMED TODAY.   LENTIGINES, SEBORRHEIC KERATOSES, HEMANGIOMAS - Benign normal skin lesions - Benign-appearing - Call for any changes  MELANOCYTIC NEVI - Tan-brown and/or pink-flesh-colored symmetric macules and papules - Benign appearing on exam today - Observation - Call clinic for new or changing moles - Recommend daily use of broad spectrum spf 30+ sunscreen to sun-exposed areas.   Biopsey Proven Moderate to severe  Dysplastic Nevus - Assessment: (Biopsy done at [facility] and results scanned into chart)healing atrophic  scar with slight regimentation - Plan:  Path report reviewed and scanned into chartSchedule a follow-up appointment for a shave excision to ensure complete removal of the lesion. Monitor for any changes in the meantime.   Early Androgenetic Alopecia Exam general thinning accentuated at temples and crown - Plan: Prescribe a combination of minoxidil and finasteride for daily application. Educate the patient on the importance of consistent use and to discontinue the medication if planning to conceive, pregnant, or nursing. Send prescription to [facility] pharmacy.  . Dermatosis Papulosa Nigra (DPNs) - Assessment: brwn fleshy papules - Plan: Reassure the patient that these growths are benign and not dangerous. No treatment necessary.   [name]  Post-inflammatory Hyperpigmentation - Assessment: Not explicitly mentioned. - Plan: Reassure the patient that the dark spot will fade over time  without intervention.  . Seborrheic Dermatitis - Assessment: Not explicitly mentioned. - Plan: Recommend DHS Zinc shampoo for maintenance and prevention. Prescribe FDA-approved topical PD-4 inhibitor foam for flare-ups and send the prescription to [facility]. Educate the patient on the rebate process and proper usage during flare-ups.  7. Follow-up Appointment - Assessment: Not explicitly mentioned. - Plan: Schedule a follow-up appointment before the end of the year for insurance purposes and to address any concerns or changes in the patient's skin condition.  Dysplastic nevus Right mid central palm/palmar hand  Biopsy proven moderate-severe dysplastic nevus (Biopsy done at [facility] and results scanned into chart)  Will plan shave removal on follow up    Return for Follow up as scheduled.  I, Joanie Coddington, CMA, am acting as scribe for [employer], DO .   Documentation: I have reviewed the above documentation for accuracy and completeness, and I agree with the above.  Langston Reusing, DO

## 2023-08-30 ENCOUNTER — Other Ambulatory Visit: Payer: Self-pay

## 2023-09-03 ENCOUNTER — Other Ambulatory Visit: Payer: Self-pay

## 2023-09-05 ENCOUNTER — Other Ambulatory Visit: Payer: Self-pay

## 2023-09-06 ENCOUNTER — Other Ambulatory Visit: Payer: Self-pay

## 2023-09-14 ENCOUNTER — Encounter: Payer: Self-pay | Admitting: Dermatology

## 2023-09-19 ENCOUNTER — Other Ambulatory Visit: Payer: Self-pay

## 2023-09-19 MED ORDER — COMIRNATY 30 MCG/0.3ML IM SUSY
PREFILLED_SYRINGE | INTRAMUSCULAR | 0 refills | Status: DC
  Filled 2023-09-19: qty 0.3, 1d supply, fill #0

## 2023-09-24 ENCOUNTER — Other Ambulatory Visit: Payer: Self-pay

## 2023-10-02 ENCOUNTER — Other Ambulatory Visit: Payer: Self-pay | Admitting: Internal Medicine

## 2023-10-03 ENCOUNTER — Encounter: Payer: Self-pay | Admitting: Dermatology

## 2023-10-03 ENCOUNTER — Other Ambulatory Visit (HOSPITAL_BASED_OUTPATIENT_CLINIC_OR_DEPARTMENT_OTHER): Payer: Self-pay

## 2023-10-03 ENCOUNTER — Other Ambulatory Visit: Payer: Self-pay

## 2023-10-03 ENCOUNTER — Ambulatory Visit: Payer: Commercial Managed Care - PPO | Admitting: Dermatology

## 2023-10-03 VITALS — BP 126/82 | HR 95

## 2023-10-03 DIAGNOSIS — D2261 Melanocytic nevi of right upper limb, including shoulder: Secondary | ICD-10-CM | POA: Diagnosis not present

## 2023-10-03 DIAGNOSIS — D239 Other benign neoplasm of skin, unspecified: Secondary | ICD-10-CM

## 2023-10-03 DIAGNOSIS — L988 Other specified disorders of the skin and subcutaneous tissue: Secondary | ICD-10-CM | POA: Diagnosis not present

## 2023-10-03 MED ORDER — MUPIROCIN 2 % EX OINT
1.0000 | TOPICAL_OINTMENT | Freq: Two times a day (BID) | CUTANEOUS | 0 refills | Status: DC
  Filled 2023-10-03: qty 22, 11d supply, fill #0

## 2023-10-03 MED ORDER — LORAZEPAM 0.5 MG PO TABS
0.5000 mg | ORAL_TABLET | Freq: Every evening | ORAL | 0 refills | Status: DC | PRN
  Filled 2023-10-03: qty 30, 30d supply, fill #0

## 2023-10-03 NOTE — Progress Notes (Signed)
   Follow-Up Visit   Subjective  Stacy [name] is a 32 y.o. female who presents for the following: Shave Excision of right mid central palm palmar hand  The following portions of the chart were reviewed this encounter and updated as appropriate: medications, allergies, medical history  Review of Systems:  No other skin or systemic complaints except as noted in HPI or Assessment and Plan.  Objective  Well appearing patient in no apparent distress; mood and affect are within normal limits.  A focused examination was performed of the following areas: right mid central palm palmar hand Relevant physical exam findings are noted in the Assessment and Plan.     Assessment & Plan   DYSPLASTIC NEVUS right mid central palm palmar hand Skin excision - right mid central palm palmar hand  Lesion length (cm):  0.4 Lesion width (cm):  0.4 Margin per side (cm):  0.5 Total excision diameter (cm):  1.4 Informed consent: discussed and consent obtained   Timeout: patient name, date of birth, surgical site, and procedure verified   Instrument used comment:  Dermablade Hemostasis achieved with: electrodesiccation   Outcome: patient tolerated procedure well with no complications   Post-procedure details: sterile dressing applied and wound care instructions given   Dressing type: bandage (Mupirocin)   Specimen 1 - Surgical pathology Differential Diagnosis: DN [id number] Check Margins: Yes -Start mupirocin oint bid to wound and after 5 days switch to stratera (sample given)  No follow-ups on file.  Owens Shark, CMA, am acting as scribe for [employer], DO.   Documentation: I have reviewed the above documentation for accuracy and completeness, and I agree with the above.  Langston Reusing, DO

## 2023-10-03 NOTE — Patient Instructions (Signed)

## 2023-10-07 LAB — SURGICAL PATHOLOGY

## 2023-10-11 ENCOUNTER — Other Ambulatory Visit: Payer: Self-pay

## 2023-10-15 ENCOUNTER — Other Ambulatory Visit: Payer: Self-pay

## 2023-10-20 ENCOUNTER — Encounter: Payer: Self-pay | Admitting: Dermatology

## 2023-10-22 NOTE — Progress Notes (Signed)
  [name] [address] [place] [address] Phone: [phone] Subjective:   LILLETTE Ashley Collet, am serving as a scribe for Dr. Arthea Claudene.  I'm seeing this patient by the request  of:  Geofm Glade PARAS, MD  CC: Back pain follow-up  YEP:Dlagzrupcz  Evalisse Rehberg is a 33 y.o. female coming in with complaint of back and neck pain. OMT 08/28/2023.  Has had difficulty with discomfort over the holidays.  Feels the cold weather is causing some discomfort.  Medications patient has been prescribed: None  Taking:         Reviewed prior external information including notes and imaging from previsou exam, outside providers and external EMR if available.   As well as notes that were available from care everywhere and other healthcare systems.  Past medical history, social, surgical and family history all reviewed in electronic medical record.  No pertanent information unless stated regarding to the chief complaint.   Past Medical History:  Diagnosis Date   Anemia    Dysplastic nevus 06/03/2023   Right mid central palm/palmar hand - moderate-severe   Hyperlipidemia     Allergies  Allergen Reactions   Doxycycline Other (See Comments)    Decreased WBC, Neutropenia     Review of Systems:  No headache, visual changes, nausea, vomiting, diarrhea, constipation, dizziness, abdominal pain, skin rash, fevers, chills, night sweats, weight loss, swollen lymph nodes, body aches, joint swelling, chest pain, shortness of breath, mood changes. POSITIVE muscle aches  Objective  Blood pressure 118/80, pulse 92, height 5' 4 (1.626 m), weight 141 lb (64 kg), SpO2 97%.   General: No apparent distress alert and oriented x3 mood and affect normal, dressed appropriately.  HEENT: Pupils equal, extraocular movements intact  Respiratory: Patient's speak in full sentences and does not appear short of breath  Cardiovascular: No lower extremity edema, non tender, no  erythema   MSK:  Back does have some loss of lordosis noted.  Some tenderness to palpation in the paraspinal musculature.  Seems to be more right side than left side.  Some hypermobility still noted.  Osteopathic findings  C2 flexed rotated and side bent right C6 flexed rotated and side bent right T3 extended rotated and side bent right inhaled rib T9 extended rotated and side bent left L2 flexed rotated and side bent right Sacrum right on right       Assessment and Plan:  Slipped rib syndrome Chronic, with mild exacerbation.  Has been having some discomfort but nothing severe enough to stop her from significant activity.  Discussed with patient to continue to monitor posture and ergonomics, which activities to do and which ones to avoid.  Follow-up again in 6 to 8 weeks.    Nonallopathic problems  Decision today to treat with OMT was based on Physical Exam  After verbal consent patient was treated with HVLA, ME, FPR techniques in cervical, rib, thoracic, lumbar, and sacral  areas  Patient tolerated the procedure well with improvement in symptoms  Patient given exercises, stretches and lifestyle modifications  See medications in patient instructions if given  Patient will follow up in 4-8 weeks    The above documentation has been reviewed and is accurate and complete Pervis Macintyre M Horald Birky, DO          Note: This dictation was prepared with Dragon dictation along with smaller phrase technology. Any transcriptional errors that result from this process are unintentional.

## 2023-10-24 ENCOUNTER — Encounter: Payer: Self-pay | Admitting: Family Medicine

## 2023-10-24 ENCOUNTER — Ambulatory Visit: Payer: Commercial Managed Care - PPO | Admitting: Family Medicine

## 2023-10-24 VITALS — BP 118/80 | HR 92 | Ht 64.0 in | Wt 141.0 lb

## 2023-10-24 DIAGNOSIS — M940 Chondrocostal junction syndrome [Tietze]: Secondary | ICD-10-CM | POA: Diagnosis not present

## 2023-10-24 DIAGNOSIS — M9901 Segmental and somatic dysfunction of cervical region: Secondary | ICD-10-CM | POA: Diagnosis not present

## 2023-10-24 DIAGNOSIS — M9902 Segmental and somatic dysfunction of thoracic region: Secondary | ICD-10-CM

## 2023-10-24 DIAGNOSIS — M9904 Segmental and somatic dysfunction of sacral region: Secondary | ICD-10-CM

## 2023-10-24 DIAGNOSIS — M9903 Segmental and somatic dysfunction of lumbar region: Secondary | ICD-10-CM

## 2023-10-24 NOTE — Patient Instructions (Signed)
 Good to see you You are awesome I will try to get back to you about the head twitching See me again in 7-8 weeks

## 2023-10-24 NOTE — Assessment & Plan Note (Signed)
 Chronic, with mild exacerbation.  Has been having some discomfort but nothing severe enough to stop her from significant activity.  Discussed with patient to continue to monitor posture and ergonomics, which activities to do and which ones to avoid.  Follow-up again in 6 to 8 weeks.

## 2023-10-25 ENCOUNTER — Ambulatory Visit: Payer: Commercial Managed Care - PPO | Admitting: Family Medicine

## 2023-12-01 ENCOUNTER — Other Ambulatory Visit: Payer: Self-pay | Admitting: Family Medicine

## 2023-12-02 ENCOUNTER — Other Ambulatory Visit: Payer: Self-pay

## 2023-12-02 MED ORDER — TIZANIDINE HCL 4 MG PO TABS
4.0000 mg | ORAL_TABLET | Freq: Every day | ORAL | 0 refills | Status: AC
  Filled 2023-12-02: qty 30, 30d supply, fill #0

## 2023-12-16 NOTE — Progress Notes (Unsigned)
 [name] [address] [place] [address] Phone: [phone] Subjective:   Bruce Donath, am serving as a scribe for Dr. Antoine Primas.  I'm seeing this patient by the request  of:  Pincus Sanes, MD  CC: back and neck pain  BJY:[id number]  Stacy [name] is a 33 y.o. female coming in with complaint of back and neck pain. OMT 10/24/2023. Patient states that she is having some pain in R side of lumbar spine. Patient is able to get a pop in lower back which relieves her pain. Pain seems to be in R SI.   Medications patient has been prescribed: none  Taking:         Reviewed prior external information including notes and imaging from previsou exam, outside providers and external EMR if available.   As well as notes that were available from care everywhere and other healthcare systems.  Past medical history, social, surgical and family history all reviewed in electronic medical record.  No pertanent information unless stated regarding to the chief complaint.   Past Medical History:  Diagnosis Date   Anemia    Dysplastic nevus 06/03/2023   Right mid central palm/palmar hand - moderate-severe   Hyperlipidemia     Allergies  Allergen Reactions   Doxycycline Other (See Comments)    Decreased WBC, Neutropenia     Review of Systems:  No headache, visual changes, nausea, vomiting, diarrhea, constipation, dizziness, abdominal pain, skin rash, fevers, chills, night sweats, weight loss, swollen lymph nodes, body aches, joint swelling, chest pain, shortness of breath, mood changes. POSITIVE muscle aches  Objective  Blood pressure 122/84, pulse 68, height 5\' 4"  (1.626 m), weight 142 lb (64.4 kg), SpO2 98%.   General: No apparent distress alert and oriented x3 mood and affect normal, dressed appropriately.  HEENT: Pupils equal, extraocular movements intact  Respiratory: Patient's speak in full sentences and does not appear short of breath   Cardiovascular: No lower extremity edema, non tender, no erythema  Gait MSK:  Back does have more pain over the sacroiliac joint than usual.  Positive FABER test on the right side.  Negative straight leg test.  Osteopathic findings  C2 flexed rotated and side bent right C6 flexed rotated and side bent left T3 extended rotated and side bent right inhaled rib T9 extended rotated and side bent left L1 flexed rotated and side bent right Sacrum right on right       Assessment and Plan:  SI (sacroiliac) joint dysfunction No new problem overall.  Discussed icing regimen and home exercises, discussed which activities to do and which ones to avoid.  Positive Pearlean Brownie was on the right side.  We discussed with patient about doing more of a sidebending component when trying to manipulate herself.  Follow-up with me again in 6 to 8 weeks otherwise.    Nonallopathic problems  Decision today to treat with OMT was based on Physical Exam  After verbal consent patient was treated with HVLA, ME, FPR techniques in cervical, rib, thoracic, lumbar, and sacral  areas  Patient tolerated the procedure well with improvement in symptoms  Patient given exercises, stretches and lifestyle modifications  See medications in patient instructions if given  Patient will follow up in 4-8 weeks     The above documentation has been reviewed and is accurate and complete Judi Saa, DO         Note: This dictation was prepared with Dragon dictation along with smaller  Lobbyist. Any transcriptional errors that result from this process are unintentional.

## 2023-12-18 ENCOUNTER — Ambulatory Visit: Payer: Commercial Managed Care - PPO | Admitting: Family Medicine

## 2023-12-18 ENCOUNTER — Encounter: Payer: Self-pay | Admitting: Family Medicine

## 2023-12-18 VITALS — BP 122/84 | HR 68 | Ht 64.0 in | Wt 142.0 lb

## 2023-12-18 DIAGNOSIS — M9903 Segmental and somatic dysfunction of lumbar region: Secondary | ICD-10-CM

## 2023-12-18 DIAGNOSIS — M9904 Segmental and somatic dysfunction of sacral region: Secondary | ICD-10-CM

## 2023-12-18 DIAGNOSIS — M533 Sacrococcygeal disorders, not elsewhere classified: Secondary | ICD-10-CM

## 2023-12-18 DIAGNOSIS — M9902 Segmental and somatic dysfunction of thoracic region: Secondary | ICD-10-CM | POA: Diagnosis not present

## 2023-12-18 DIAGNOSIS — M9908 Segmental and somatic dysfunction of rib cage: Secondary | ICD-10-CM | POA: Diagnosis not present

## 2023-12-18 DIAGNOSIS — M9901 Segmental and somatic dysfunction of cervical region: Secondary | ICD-10-CM

## 2023-12-18 NOTE — Assessment & Plan Note (Signed)
 No new problem overall.  Discussed icing regimen and home exercises, discussed which activities to do and which ones to avoid.  Positive Pearlean Brownie was on the right side.  We discussed with patient about doing more of a sidebending component when trying to manipulate herself.  Follow-up with me again in 6 to 8 weeks otherwise.

## 2023-12-18 NOTE — Patient Instructions (Signed)
 Close down Joanns Change the pedals See me in 7-8 weeks

## 2024-01-14 ENCOUNTER — Other Ambulatory Visit: Payer: Self-pay | Admitting: Family Medicine

## 2024-01-14 ENCOUNTER — Other Ambulatory Visit: Payer: Self-pay

## 2024-01-21 ENCOUNTER — Other Ambulatory Visit: Payer: Self-pay

## 2024-02-11 NOTE — Progress Notes (Unsigned)
 [facility] [address] [place] [address] Phone: [phone] Subjective:   IBryan Caprio, am serving as a scribe for Dr. Ronnell Coins.  I'm seeing this patient by the request  of:  Colene Dauphin, MD  CC: Left shoulder, right elbow, back pain  [id number]  Stacy [name] is a 33 y.o. female coming in with complaint of back and neck pain. OMT on 12/18/2023. Patient states L shoulder and R elbow is a little irritated. R hip out of sorts.       Reviewed prior external information including notes and imaging from previsou exam, outside providers and external EMR if available.   As well as notes that were available from care everywhere and other healthcare systems.  Past medical history, social, surgical and family history all reviewed in electronic medical record.  No pertanent information unless stated regarding to the chief complaint.   Past Medical History:  Diagnosis Date   Anemia    Dysplastic nevus 06/03/2023   Right mid central palm/palmar hand - moderate-severe   Hyperlipidemia     Allergies  Allergen Reactions   Doxycycline Other (See Comments)    Decreased WBC, Neutropenia     Review of Systems:  No headache, visual changes, nausea, vomiting, diarrhea, constipation, dizziness, abdominal pain, skin rash, fevers, chills, night sweats, weight loss, swollen lymph nodes, body aches, joint swelling, chest pain, shortness of breath, mood changes. POSITIVE muscle aches  Objective  Blood pressure 120/70, height 5\' 4"  (1.626 m), weight 143 lb (64.9 kg), SpO2 98%.   General: No apparent distress alert and oriented x3 mood and affect normal, dressed appropriately.  HEENT: Pupils equal, extraocular movements intact  Respiratory: Patient's speak in full sentences and does not appear short of breath  Cardiovascular: No lower extremity edema, non tender, no erythema  Gait normal MSK: Left shoulder site seems as more anterior  subluxation noted.  Some tightness noted in the paraspinal musculature.  Seems to be more on the left shoulder blade Back significant tightness on the right paraspinal musculature and hip flexor.  Posterior rotation noted.  Osteopathic findings  C6 flexed rotated and side bent left T3 extended rotated and side bent left inhaled rib T9 extended rotated and side bent left inhaled rib L2 flexed rotated and side bent right L4 flexed rotated and side bent right Sacrum right on right       Assessment and Plan:  Slipped rib syndrome Chronic problem with what appears to be more of a subluxation of the shoulder noted today.  Discussed icing regimen and home exercises, discussed which activities to do and which ones to avoid.  Increase activity slowly.  Patient given some self manipulation techniques that I think will be helpful.  Follow-up again in 6 to 8 weeks otherwise    Nonallopathic problems  Decision today to treat with OMT was based on Physical Exam  After verbal consent patient was treated with HVLA, ME, FPR techniques in cervical, rib, thoracic, lumbar, and sacral  areas  Patient tolerated the procedure well with improvement in symptoms  Patient given exercises, stretches and lifestyle modifications  See medications in patient instructions if given  Patient will follow up in 4-8 weeks    The above documentation has been reviewed and is accurate and complete Isidro Margo, DO          Note: This dictation was prepared with Dragon dictation along with smaller phrase technology. Any transcriptional errors that result from this  process are unintentional.

## 2024-02-12 ENCOUNTER — Ambulatory Visit: Admitting: Family Medicine

## 2024-02-12 ENCOUNTER — Other Ambulatory Visit: Payer: Self-pay | Admitting: Obstetrics and Gynecology

## 2024-02-12 ENCOUNTER — Other Ambulatory Visit: Payer: Self-pay

## 2024-02-12 ENCOUNTER — Encounter: Payer: Self-pay | Admitting: Family Medicine

## 2024-02-12 VITALS — BP 120/70 | Ht 64.0 in | Wt 143.0 lb

## 2024-02-12 DIAGNOSIS — M9902 Segmental and somatic dysfunction of thoracic region: Secondary | ICD-10-CM | POA: Diagnosis not present

## 2024-02-12 DIAGNOSIS — M940 Chondrocostal junction syndrome [Tietze]: Secondary | ICD-10-CM | POA: Diagnosis not present

## 2024-02-12 DIAGNOSIS — M9904 Segmental and somatic dysfunction of sacral region: Secondary | ICD-10-CM

## 2024-02-12 DIAGNOSIS — M9903 Segmental and somatic dysfunction of lumbar region: Secondary | ICD-10-CM | POA: Diagnosis not present

## 2024-02-12 DIAGNOSIS — M9901 Segmental and somatic dysfunction of cervical region: Secondary | ICD-10-CM

## 2024-02-12 DIAGNOSIS — N6332 Unspecified lump in axillary tail of the left breast: Secondary | ICD-10-CM

## 2024-02-12 DIAGNOSIS — R2232 Localized swelling, mass and lump, left upper limb: Secondary | ICD-10-CM | POA: Diagnosis not present

## 2024-02-12 DIAGNOSIS — N644 Mastodynia: Secondary | ICD-10-CM | POA: Diagnosis not present

## 2024-02-12 DIAGNOSIS — M9908 Segmental and somatic dysfunction of rib cage: Secondary | ICD-10-CM

## 2024-02-12 DIAGNOSIS — N6320 Unspecified lump in the left breast, unspecified quadrant: Secondary | ICD-10-CM | POA: Diagnosis not present

## 2024-02-12 MED ORDER — SULFAMETHOXAZOLE-TRIMETHOPRIM 800-160 MG PO TABS
1.0000 | ORAL_TABLET | Freq: Two times a day (BID) | ORAL | 0 refills | Status: DC
  Filled 2024-02-12: qty 10, 5d supply, fill #0

## 2024-02-12 NOTE — Patient Instructions (Signed)
 Good to see you! Thanks for keeping me on my toes See you again in 6 weeks

## 2024-02-12 NOTE — Assessment & Plan Note (Signed)
 Chronic problem with what appears to be more of a subluxation of the shoulder noted today.  Discussed icing regimen and home exercises, discussed which activities to do and which ones to avoid.  Increase activity slowly.  Patient given some self manipulation techniques that I think will be helpful.  Follow-up again in 6 to 8 weeks otherwise

## 2024-02-14 ENCOUNTER — Ambulatory Visit
Admission: RE | Admit: 2024-02-14 | Discharge: 2024-02-14 | Disposition: A | Discharge status: Home / Self Care | Source: Ambulatory Visit | Attending: Obstetrics and Gynecology | Admitting: Obstetrics and Gynecology

## 2024-02-14 ENCOUNTER — Other Ambulatory Visit: Payer: Self-pay | Admitting: Obstetrics and Gynecology

## 2024-02-14 DIAGNOSIS — N6459 Other signs and symptoms in breast: Secondary | ICD-10-CM | POA: Diagnosis not present

## 2024-02-14 DIAGNOSIS — R921 Mammographic calcification found on diagnostic imaging of breast: Secondary | ICD-10-CM | POA: Diagnosis not present

## 2024-02-14 DIAGNOSIS — N6332 Unspecified lump in axillary tail of the left breast: Secondary | ICD-10-CM

## 2024-02-14 DIAGNOSIS — Z803 Family history of malignant neoplasm of breast: Secondary | ICD-10-CM | POA: Diagnosis not present

## 2024-02-17 ENCOUNTER — Other Ambulatory Visit (HOSPITAL_COMMUNITY): Payer: Self-pay | Admitting: Diagnostic Radiology

## 2024-02-17 ENCOUNTER — Other Ambulatory Visit: Payer: Self-pay

## 2024-02-17 ENCOUNTER — Ambulatory Visit
Admission: RE | Admit: 2024-02-17 | Discharge: 2024-02-17 | Discharge status: Home / Self Care | Source: Ambulatory Visit | Attending: Obstetrics and Gynecology | Admitting: Obstetrics and Gynecology

## 2024-02-17 ENCOUNTER — Other Ambulatory Visit: Payer: Self-pay | Admitting: Internal Medicine

## 2024-02-17 ENCOUNTER — Ambulatory Visit
Admission: RE | Admit: 2024-02-17 | Discharge: 2024-02-17 | Disposition: A | Discharge status: Home / Self Care | Source: Ambulatory Visit | Attending: Obstetrics and Gynecology | Admitting: Obstetrics and Gynecology

## 2024-02-17 DIAGNOSIS — R92342 Mammographic extreme density, left breast: Secondary | ICD-10-CM | POA: Diagnosis not present

## 2024-02-17 DIAGNOSIS — R921 Mammographic calcification found on diagnostic imaging of breast: Secondary | ICD-10-CM | POA: Diagnosis not present

## 2024-02-17 DIAGNOSIS — N6021 Fibroadenosis of right breast: Secondary | ICD-10-CM | POA: Diagnosis not present

## 2024-02-17 HISTORY — PX: BREAST BIOPSY: SHX20

## 2024-02-18 ENCOUNTER — Other Ambulatory Visit: Payer: Self-pay

## 2024-02-18 LAB — SURGICAL PATHOLOGY

## 2024-02-18 MED FILL — Lorazepam Tab 0.5 MG: ORAL | 30 days supply | Qty: 30 | Fill #0 | Status: AC

## 2024-02-25 ENCOUNTER — Other Ambulatory Visit

## 2024-02-25 ENCOUNTER — Encounter

## 2024-02-25 ENCOUNTER — Other Ambulatory Visit: Payer: Self-pay | Admitting: Obstetrics and Gynecology

## 2024-02-25 DIAGNOSIS — R921 Mammographic calcification found on diagnostic imaging of breast: Secondary | ICD-10-CM

## 2024-03-20 ENCOUNTER — Other Ambulatory Visit: Payer: Self-pay

## 2024-03-20 DIAGNOSIS — Z01419 Encounter for gynecological examination (general) (routine) without abnormal findings: Secondary | ICD-10-CM | POA: Diagnosis not present

## 2024-03-20 DIAGNOSIS — Z6825 Body mass index (BMI) 25.0-25.9, adult: Secondary | ICD-10-CM | POA: Diagnosis not present

## 2024-03-20 DIAGNOSIS — Z124 Encounter for screening for malignant neoplasm of cervix: Secondary | ICD-10-CM | POA: Diagnosis not present

## 2024-03-20 DIAGNOSIS — Z309 Encounter for contraceptive management, unspecified: Secondary | ICD-10-CM | POA: Diagnosis not present

## 2024-03-20 DIAGNOSIS — Z1151 Encounter for screening for human papillomavirus (HPV): Secondary | ICD-10-CM | POA: Diagnosis not present

## 2024-03-20 DIAGNOSIS — Z3162 Encounter for fertility preservation counseling: Secondary | ICD-10-CM | POA: Diagnosis not present

## 2024-03-20 DIAGNOSIS — Z9189 Other specified personal risk factors, not elsewhere classified: Secondary | ICD-10-CM | POA: Diagnosis not present

## 2024-03-20 MED ORDER — ETONOGESTREL-ETHINYL ESTRADIOL 0.12-0.015 MG/24HR VA RING
1.0000 | VAGINAL_RING | VAGINAL | 4 refills | Status: DC
  Filled 2024-03-20: qty 3, 84d supply, fill #0
  Filled 2024-08-24: qty 3, 84d supply, fill #1

## 2024-03-23 ENCOUNTER — Other Ambulatory Visit: Payer: Self-pay | Admitting: Obstetrics and Gynecology

## 2024-03-23 DIAGNOSIS — Z9189 Other specified personal risk factors, not elsewhere classified: Secondary | ICD-10-CM

## 2024-03-24 NOTE — Progress Notes (Unsigned)
  [name] [address] [place] [address] Phone: [phone] Subjective:   Stacy [name], am serving as a scribe for Dr. Ronnell Coins.  I'm seeing this patient by the request  of:  Colene Dauphin, MD  CC: Low back pain  [id number]  Stacy [name] is a 33 y.o. female coming in with complaint of back and neck pain. OMT on 02/12/2024. Patient states   Medications patient has been prescribed:   Taking:         Reviewed prior external information including notes and imaging from previsou exam, outside providers and external EMR if available.   As well as notes that were available from care everywhere and other healthcare systems.  Past medical history, social, surgical and family history all reviewed in electronic medical record.  No pertanent information unless stated regarding to the chief complaint.   Past Medical History:  Diagnosis Date   Anemia    Dysplastic nevus 06/03/2023   Right mid central palm/palmar hand - moderate-severe   Hyperlipidemia     Allergies  Allergen Reactions   Doxycycline Other (See Comments)    Decreased WBC, Neutropenia     Review of Systems:  No headache, visual changes, nausea, vomiting, diarrhea, constipation, dizziness, abdominal pain, skin rash, fevers, chills, night sweats, weight loss, swollen lymph nodes, body aches, joint swelling, chest pain, shortness of breath, mood changes. POSITIVE muscle aches  Objective  Pulse 76, height 5' 4 (1.626 m), SpO2 98%.   General: No apparent distress alert and oriented x3 mood and affect normal, dressed appropriately.  HEENT: Pupils equal, extraocular movements intact  Respiratory: Patient's speak in full sentences and does not appear short of breath  Cardiovascular: No lower extremity edema, non tender, no erythema  Gait relatively normal MSK:  Back more tightness noted around the neck on the left side.  Limited range of motion  noted.  Osteopathic findings  C2 flexed rotated and side bent left C6 flexed rotated and side bent left T3 extended rotated and side bent left inhaled rib T9 extended rotated and side bent left L2 flexed rotated and side bent right L3 flexed rotated and side bent left Sacrum right on right       Assessment and Plan:  No problem-specific Assessment & Plan notes found for this encounter.    Nonallopathic problems  Decision today to treat with OMT was based on Physical Exam  After verbal consent patient was treated with HVLA, ME, FPR techniques in cervical, rib, thoracic, lumbar, and sacral  areas  Patient tolerated the procedure well with improvement in symptoms  Patient given exercises, stretches and lifestyle modifications  See medications in patient instructions if given  Patient will follow up in 8 weeks    The above documentation has been reviewed and is accurate and complete Stacy [name] M Stacy Avans, DO          Note: This dictation was prepared with Dragon dictation along with smaller phrase technology. Any transcriptional errors that result from this process are unintentional.

## 2024-03-25 ENCOUNTER — Encounter: Payer: Self-pay | Admitting: Family Medicine

## 2024-03-25 ENCOUNTER — Ambulatory Visit: Admitting: Family Medicine

## 2024-03-25 VITALS — HR 76 | Ht 64.0 in

## 2024-03-25 DIAGNOSIS — M9904 Segmental and somatic dysfunction of sacral region: Secondary | ICD-10-CM

## 2024-03-25 DIAGNOSIS — M9902 Segmental and somatic dysfunction of thoracic region: Secondary | ICD-10-CM | POA: Diagnosis not present

## 2024-03-25 DIAGNOSIS — M9903 Segmental and somatic dysfunction of lumbar region: Secondary | ICD-10-CM | POA: Diagnosis not present

## 2024-03-25 DIAGNOSIS — M9901 Segmental and somatic dysfunction of cervical region: Secondary | ICD-10-CM | POA: Diagnosis not present

## 2024-03-25 DIAGNOSIS — M940 Chondrocostal junction syndrome [Tietze]: Secondary | ICD-10-CM

## 2024-03-25 DIAGNOSIS — M9908 Segmental and somatic dysfunction of rib cage: Secondary | ICD-10-CM

## 2024-03-25 NOTE — Patient Instructions (Signed)
 You are the best Great way to end the day Good luck with mileage on car See me in 2 months

## 2024-03-25 NOTE — Assessment & Plan Note (Signed)
 Increase in some sleep rib syndrome.  Responded extremely well to osteopathic manipulation.  Some tightness noted in the parascapular area.  Continue to walk posture and ergonomics.  Follow-up again in 8 weeks

## 2024-04-10 ENCOUNTER — Encounter: Payer: Self-pay | Admitting: Obstetrics and Gynecology

## 2024-04-20 DIAGNOSIS — Z319 Encounter for procreative management, unspecified: Secondary | ICD-10-CM | POA: Diagnosis not present

## 2024-04-20 DIAGNOSIS — E559 Vitamin D deficiency, unspecified: Secondary | ICD-10-CM | POA: Diagnosis not present

## 2024-04-20 DIAGNOSIS — Z3162 Encounter for fertility preservation counseling: Secondary | ICD-10-CM | POA: Diagnosis not present

## 2024-04-28 ENCOUNTER — Ambulatory Visit
Admission: RE | Admit: 2024-04-28 | Discharge: 2024-04-28 | Disposition: A | Discharge status: Home / Self Care | Source: Ambulatory Visit | Attending: Obstetrics and Gynecology | Admitting: Obstetrics and Gynecology

## 2024-04-28 DIAGNOSIS — Z9189 Other specified personal risk factors, not elsewhere classified: Secondary | ICD-10-CM

## 2024-04-28 DIAGNOSIS — Z319 Encounter for procreative management, unspecified: Secondary | ICD-10-CM | POA: Diagnosis not present

## 2024-04-28 DIAGNOSIS — N6320 Unspecified lump in the left breast, unspecified quadrant: Secondary | ICD-10-CM | POA: Diagnosis not present

## 2024-04-28 DIAGNOSIS — R928 Other abnormal and inconclusive findings on diagnostic imaging of breast: Secondary | ICD-10-CM | POA: Diagnosis not present

## 2024-04-28 MED ORDER — GADOPICLENOL 0.5 MMOL/ML IV SOLN
7.0000 mL | Freq: Once | INTRAVENOUS | Status: AC | PRN
  Administered 2024-04-28: 7 mL via INTRAVENOUS

## 2024-04-29 ENCOUNTER — Other Ambulatory Visit: Payer: Self-pay

## 2024-04-29 MED ORDER — METFORMIN HCL 1000 MG PO TABS
2000.0000 mg | ORAL_TABLET | Freq: Every day | ORAL | 3 refills | Status: AC
  Filled 2024-04-29: qty 30, 15d supply, fill #0
  Filled 2024-05-23: qty 30, 15d supply, fill #1
  Filled 2024-06-11: qty 30, 15d supply, fill #2

## 2024-05-15 ENCOUNTER — Other Ambulatory Visit: Payer: Self-pay | Admitting: Obstetrics and Gynecology

## 2024-05-15 DIAGNOSIS — R928 Other abnormal and inconclusive findings on diagnostic imaging of breast: Secondary | ICD-10-CM

## 2024-05-15 DIAGNOSIS — N63 Unspecified lump in unspecified breast: Secondary | ICD-10-CM

## 2024-05-18 ENCOUNTER — Ambulatory Visit
Admission: RE | Admit: 2024-05-18 | Discharge: 2024-05-18 | Disposition: A | Discharge status: Home / Self Care | Source: Ambulatory Visit | Attending: Obstetrics and Gynecology | Admitting: Obstetrics and Gynecology

## 2024-05-18 DIAGNOSIS — N63 Unspecified lump in unspecified breast: Secondary | ICD-10-CM

## 2024-05-18 DIAGNOSIS — N6489 Other specified disorders of breast: Secondary | ICD-10-CM | POA: Diagnosis not present

## 2024-05-20 ENCOUNTER — Ambulatory Visit
Admission: RE | Admit: 2024-05-20 | Discharge: 2024-05-20 | Disposition: A | Discharge status: Home / Self Care | Source: Ambulatory Visit | Attending: Obstetrics and Gynecology | Admitting: Obstetrics and Gynecology

## 2024-05-20 ENCOUNTER — Other Ambulatory Visit (HOSPITAL_COMMUNITY): Payer: Self-pay | Admitting: Diagnostic Radiology

## 2024-05-20 ENCOUNTER — Other Ambulatory Visit: Payer: Self-pay | Admitting: Obstetrics and Gynecology

## 2024-05-20 DIAGNOSIS — R928 Other abnormal and inconclusive findings on diagnostic imaging of breast: Secondary | ICD-10-CM

## 2024-05-20 MED ORDER — GADOPICLENOL 0.5 MMOL/ML IV SOLN
6.5000 mL | Freq: Once | INTRAVENOUS | Status: AC | PRN
  Administered 2024-05-20: 6.5 mL via INTRAVENOUS

## 2024-05-21 LAB — SURGICAL PATHOLOGY

## 2024-05-22 ENCOUNTER — Encounter: Payer: Commercial Managed Care - PPO | Admitting: Internal Medicine

## 2024-05-25 ENCOUNTER — Other Ambulatory Visit

## 2024-05-25 NOTE — Progress Notes (Signed)
 Subjective:    Patient ID: Stacy [name], female    DOB: 01/26/1991, 33 y.o.   MRN: [id number]      HPI Stacy [name] is here for a Physical exam and her chronic medical problems.   ? Contact dermatitis - left breast biopsy recently and had adhesives on the area and she started to notice redness not at the biopsy sites themselves, but where the adhesives were.  She has been taking- benadryl, hydrocortisone with some improvement, but still having some redness and swelling in those areas.  2 breast biopsies this summer.  Diagnosed with PCOS  Pcos - on metformin  --going to have egg retrieval next month hopefully.     Medications and allergies reviewed with patient and updated if appropriate.  Current Outpatient Medications on File Prior to Visit  Medication Sig Dispense Refill   albuterol  (VENTOLIN  HFA) 108 (90 Base) MCG/ACT inhaler Inhale 2 puffs into the lungs every 6 (six) hours as needed for wheezing or shortness of breath. 8 g 1   etonogestrel -ethinyl estradiol  (NUVARING) 0.12-0.015 MG/24HR vaginal ring Place 1 ring vaginally once every 4 weeks; leave in for 3 weeks and remove for 1 week or as directed 3 each 4   ketoconazole  (NIZORAL ) 2 % shampoo Apply 1 Application topically 2 (two) times a week. 120 mL 5   levocetirizine (XYZAL ) 5 MG tablet Take 1 tablet (5 mg total) by mouth every evening. 90 tablet 1   metFORMIN  (GLUCOPHAGE ) 1000 MG tablet Take 2 tablets (2,000 mg total) by mouth daily as tolerated. 30 tablet 3   propranolol  (INDERAL ) 10 MG tablet TAKE 1 TABLET BY MOUTH DAILY AS NEEDED (PRESENTATIONS). 30 tablet 0   Roflumilast , Antiseborrheic, (ZORYVE ) 0.3 % FOAM Apply 1 application topically daily. Apply to scalp as needed 60 g 3   tiZANidine  (ZANAFLEX ) 4 MG tablet Take 1 tablet (4 mg total) by mouth at bedtime. 30 tablet 0   mupirocin  ointment (BACTROBAN ) 2 % Apply 1 Application topically 2 (two) times daily. 22 g 0   Olopatadine  HCl 0.2 % SOLN Place one drop per eye once daily  as needed for itchy/watery eyes 2.5 mL 2   Safety Seal Miscellaneous MISC Apply 1 application  topically every morning. Hormonic Hair - Apply to scalp 30 mL 3   No current facility-administered medications on file prior to visit.    Review of Systems  Constitutional:  Negative for fever.  Eyes:  Negative for visual disturbance.  Respiratory:  Negative for cough, shortness of breath and wheezing.   Cardiovascular:  Negative for chest pain, palpitations and leg swelling.  Gastrointestinal:  Positive for diarrhea (from metformin ). Negative for abdominal pain, blood in stool and constipation.       Occ gerd  Genitourinary:  Negative for dysuria.  Musculoskeletal:  Positive for arthralgias and back pain.  Skin:  Positive for rash (dermatitis left axilla).  Neurological:  Positive for headaches (from neck tension). Negative for light-headedness.  Psychiatric/Behavioral:  Negative for dysphoric mood. The patient is nervous/anxious (situational).        Objective:   Vitals:   05/26/24 1505  BP: 120/76  Pulse: 88  Temp: 98.3 F (36.8 C)  SpO2: 98%   Filed Weights   05/26/24 1505  Weight: 142 lb (64.4 kg)   Body mass index is 24.37 kg/m.  BP Readings from Last 3 Encounters:  05/26/24 120/76  02/12/24 120/70  12/18/23 122/84    Wt Readings from Last 3 Encounters:  05/26/24 142 lb (64.4 kg)  02/12/24 143 lb (64.9 kg)  12/18/23 142 lb (64.4 kg)       Physical Exam Constitutional: She appears well-developed and well-nourished. No distress.  HENT:  Head: Normocephalic and atraumatic.  Right Ear: External ear normal. Normal ear canal and TM Left Ear: External ear normal.  Normal ear canal and TM Mouth/Throat: Oropharynx is clear and moist.  Eyes: Conjunctivae normal.  Neck: Neck supple. No tracheal deviation present. No thyromegaly present.  No carotid bruit  Cardiovascular: Normal rate, regular rhythm and normal heart sounds.   No murmur heard.  No  edema. Pulmonary/Chest: Effort normal and breath sounds normal. No respiratory distress. She has no wheezes. She has no rales.  Breast: deferred   Abdominal: Soft. She exhibits no distension. There is no tenderness.  Lymphadenopathy: She has no cervical adenopathy.  Skin: Skin is warm and dry. She is not diaphoretic.  Psychiatric: She has a normal mood and affect. Her behavior is normal.     Lab Results  Component Value Date   WBC 3.7 (L) 05/22/2023   HGB 13.8 05/22/2023   HCT 42.7 05/22/2023   PLT 304.0 05/22/2023   GLUCOSE 88 05/22/2023   CHOL 248 (H) 05/22/2023   TRIG 77.0 05/22/2023   HDL 81.00 05/22/2023   LDLCALC 152 (H) 05/22/2023   ALT 10 05/22/2023   AST 19 05/22/2023   NA 136 05/22/2023   K 4.1 05/22/2023   CL 101 05/22/2023   CREATININE 0.86 05/22/2023   BUN 9 05/22/2023   CO2 28 05/22/2023   TSH 1.89 05/22/2023   HGBA1C 4.9 04/22/2020         Assessment & Plan:   Physical exam: Screening blood work  ordered Exercise  swim 2/week, dance 1/week Weight  normal Substance abuse  none   Reviewed recommended immunizations.   Health Maintenance  Topic Date Due   Cervical Cancer Screening (HPV/Pap Cotest)  02/22/2022   COVID-19 Vaccine (7 - Mixed Product risk 2024-25 season) 03/19/2024   INFLUENZA VACCINE  05/15/2024   DTaP/Tdap/Td (3 - Td or Tdap) 04/06/2031   Hepatitis C Screening  Completed   HIV Screening  Completed   Meningococcal B Vaccine  Aged Out   Pneumococcal Vaccine: 19-49 Years  Discontinued   Hepatitis B Vaccines  Discontinued   HPV VACCINES  Discontinued          See Problem List for Assessment and Plan of chronic medical problems.

## 2024-05-25 NOTE — Patient Instructions (Addendum)
 Blood work was ordered.       Medications changes include :   scopolamine  patch, azithro, triamcinolone  cream     Return in about 1 year (around 05/26/2025) for Physical Exam.    Health Maintenance, Female Adopting a healthy lifestyle and getting preventive care are important in promoting health and wellness. Ask your health care provider about: The right schedule for you to have regular tests and exams. Things you can do on your own to prevent diseases and keep yourself healthy. What should I know about diet, weight, and exercise? Eat a healthy diet  Eat a diet that includes plenty of vegetables, fruits, low-fat dairy products, and lean protein. Do not eat a lot of foods that are high in solid fats, added sugars, or sodium. Maintain a healthy weight Body mass index (BMI) is used to identify weight problems. It estimates body fat based on height and weight. Your health care provider can help determine your BMI and help you achieve or maintain a healthy weight. Get regular exercise Get regular exercise. This is one of the most important things you can do for your health. Most adults should: Exercise for at least 150 minutes each week. The exercise should increase your heart rate and make you sweat (moderate-intensity exercise). Do strengthening exercises at least twice a week. This is in addition to the moderate-intensity exercise. Spend less time sitting. Even light physical activity can be beneficial. Watch cholesterol and blood lipids Have your blood tested for lipids and cholesterol at 33 years of age, then have this test every 5 years. Have your cholesterol levels checked more often if: Your lipid or cholesterol levels are high. You are older than 33 years of age. You are at high risk for heart disease. What should I know about cancer screening? Depending on your health history and family history, you may need to have cancer screening at various ages. This may include  screening for: Breast cancer. Cervical cancer. Colorectal cancer. Skin cancer. Lung cancer. What should I know about heart disease, diabetes, and high blood pressure? Blood pressure and heart disease High blood pressure causes heart disease and increases the risk of stroke. This is more likely to develop in people who have high blood pressure readings or are overweight. Have your blood pressure checked: Every 3-5 years if you are 71-66 years of age. Every year if you are 60 years old or older. Diabetes Have regular diabetes screenings. This checks your fasting blood sugar level. Have the screening done: Once every three years after age 64 if you are at a normal weight and have a low risk for diabetes. More often and at a younger age if you are overweight or have a high risk for diabetes. What should I know about preventing infection? Hepatitis B If you have a higher risk for hepatitis B, you should be screened for this virus. Talk with your health care provider to find out if you are at risk for hepatitis B infection. Hepatitis C Testing is recommended for: Everyone born from [birth year] through 1965. Anyone with known risk factors for hepatitis C. Sexually transmitted infections (STIs) Get screened for STIs, including gonorrhea and chlamydia, if: You are sexually active and are younger than 33 years of age. You are older than 33 years of age and your health care provider tells you that you are at risk for this type of infection. Your sexual activity has changed since you were last screened, and you are at increased  risk for chlamydia or gonorrhea. Ask your health care provider if you are at risk. Ask your health care provider about whether you are at high risk for HIV. Your health care provider may recommend a prescription medicine to help prevent HIV infection. If you choose to take medicine to prevent HIV, you should first get tested for HIV. You should then be tested every 3 months for as  long as you are taking the medicine. Pregnancy If you are about to stop having your period (premenopausal) and you may become pregnant, seek counseling before you get pregnant. Take 400 to 800 micrograms (mcg) of folic acid every day if you become pregnant. Ask for birth control (contraception) if you want to prevent pregnancy. Osteoporosis and menopause Osteoporosis is a disease in which the bones lose minerals and strength with aging. This can result in bone fractures. If you are 26 years old or older, or if you are at risk for osteoporosis and fractures, ask your health care provider if you should: Be screened for bone loss. Take a calcium or vitamin D  supplement to lower your risk of fractures. Be given hormone replacement therapy (HRT) to treat symptoms of menopause. Follow these instructions at home: Alcohol use Do not drink alcohol if: Your health care provider tells you not to drink. You are pregnant, may be pregnant, or are planning to become pregnant. If you drink alcohol: Limit how much you have to: 0-1 drink a day. Know how much alcohol is in your drink. In the [place] one drink equals one 12 oz bottle of beer (355 mL), one 5 oz glass of wine (148 mL), or one 1 oz glass of hard liquor (44 mL). Lifestyle Do not use any products that contain nicotine or tobacco. These products include cigarettes, chewing tobacco, and vaping devices, such as e-cigarettes. If you need help quitting, ask your health care provider. Do not use street drugs. Do not share needles. Ask your health care provider for help if you need support or information about quitting drugs. General instructions Schedule regular health, dental, and eye exams. Stay current with your vaccines. Tell your health care provider if: You often feel depressed. You have ever been abused or do not feel safe at home. Summary Adopting a healthy lifestyle and getting preventive care are important in promoting health and  wellness. Follow your health care provider's instructions about healthy diet, exercising, and getting tested or screened for diseases. Follow your health care provider's instructions on monitoring your cholesterol and blood pressure. This information is not intended to replace advice given to you by your health care provider. Make sure you discuss any questions you have with your health care provider. Document Revised: 02/20/2021 Document Reviewed: 02/20/2021 Elsevier Patient Education  2024 [employer].

## 2024-05-26 ENCOUNTER — Encounter: Payer: Self-pay | Admitting: Internal Medicine

## 2024-05-26 ENCOUNTER — Other Ambulatory Visit: Payer: Self-pay

## 2024-05-26 ENCOUNTER — Ambulatory Visit: Admitting: Internal Medicine

## 2024-05-26 VITALS — BP 120/76 | HR 88 | Temp 98.3°F | Ht 64.0 in | Wt 142.0 lb

## 2024-05-26 DIAGNOSIS — F419 Anxiety disorder, unspecified: Secondary | ICD-10-CM

## 2024-05-26 DIAGNOSIS — E7849 Other hyperlipidemia: Secondary | ICD-10-CM | POA: Diagnosis not present

## 2024-05-26 DIAGNOSIS — E282 Polycystic ovarian syndrome: Secondary | ICD-10-CM | POA: Diagnosis not present

## 2024-05-26 DIAGNOSIS — G479 Sleep disorder, unspecified: Secondary | ICD-10-CM | POA: Diagnosis not present

## 2024-05-26 DIAGNOSIS — Z319 Encounter for procreative management, unspecified: Secondary | ICD-10-CM | POA: Diagnosis not present

## 2024-05-26 DIAGNOSIS — Z0000 Encounter for general adult medical examination without abnormal findings: Secondary | ICD-10-CM

## 2024-05-26 DIAGNOSIS — F418 Other specified anxiety disorders: Secondary | ICD-10-CM

## 2024-05-26 MED ORDER — RYALTRIS 665-25 MCG/ACT NA SUSP: 2.0000 | Freq: Every day | NASAL | 2 refills | Status: AC

## 2024-05-26 MED ORDER — SCOPOLAMINE 1 MG/3DAYS TD PT72
1.0000 | MEDICATED_PATCH | TRANSDERMAL | 0 refills | Status: AC
  Filled 2024-05-26: qty 10, 30d supply, fill #0

## 2024-05-26 MED ORDER — LORAZEPAM 0.5 MG PO TABS
0.5000 mg | ORAL_TABLET | Freq: Every evening | ORAL | 2 refills | Status: AC | PRN
  Filled 2024-05-26: qty 30, 30d supply, fill #0
  Filled 2024-08-25: qty 30, 30d supply, fill #1

## 2024-05-26 MED ORDER — AZITHROMYCIN 500 MG PO TABS
500.0000 mg | ORAL_TABLET | Freq: Every day | ORAL | 0 refills | Status: AC
  Filled 2024-05-26: qty 3, 3d supply, fill #0

## 2024-05-26 MED ORDER — TRIAMCINOLONE ACETONIDE 0.1 % EX CREA
1.0000 | TOPICAL_CREAM | Freq: Two times a day (BID) | CUTANEOUS | 0 refills | Status: AC
  Filled 2024-05-26: qty 30, 30d supply, fill #0

## 2024-05-26 NOTE — Assessment & Plan Note (Signed)
 Has anxiety related to presentations Continue propranolol  10 mg daily as needed for presentations

## 2024-05-26 NOTE — Assessment & Plan Note (Signed)
Chronic Intermittent Okay to continue lorazepam 0.5 mg nightly as needed-she does keep this to a minimum-refilled today

## 2024-05-26 NOTE — Assessment & Plan Note (Signed)
 Chronic Denies depression, situational anxiety Has been off of Lexapro  and doing well Continue regular exercise

## 2024-05-26 NOTE — Assessment & Plan Note (Signed)
 Currently on metformin  500 mg twice daily Off birth control now in anticipation of egg retrieval in the next couple of months

## 2024-05-27 ENCOUNTER — Other Ambulatory Visit: Payer: Self-pay

## 2024-05-27 ENCOUNTER — Other Ambulatory Visit

## 2024-05-27 ENCOUNTER — Encounter

## 2024-06-03 ENCOUNTER — Ambulatory Visit: Admitting: Family Medicine

## 2024-06-05 NOTE — Progress Notes (Unsigned)
 [name] [address] [place] [address] Phone: [phone] Subjective:   Stacy [name], am serving as a scribe for Dr. Arthea [name].  I'm seeing this patient by the request  of:  Stacy Glade PARAS, MD  CC: Back and neck pain follow-up  [id number]  Stacy [name] is a 33 y.o. female coming in with complaint of back and neck pain. OMT on 03/25/2024. Patient states that she has some R sided lumbar spine tightness. Pain is constant.   Also having leg spasms at night and more on R side than L.   Medications patient has been prescribed:   Taking:         Reviewed prior external information including notes and imaging from previsou exam, outside providers and external EMR if available.   As well as notes that were available from care everywhere and other healthcare systems.  Past medical history, social, surgical and family history all reviewed in electronic medical record.  No pertanent information unless stated regarding to the chief complaint.   Past Medical History:  Diagnosis Date   Anemia    Dysplastic nevus 06/03/2023   Right mid central palm/palmar hand - moderate-severe   Hyperlipidemia     Allergies  Allergen Reactions   Doxycycline Other (See Comments)    Decreased WBC, Neutropenia   Acetaminophen Other (See Comments)    Cough, throat irritation     Review of Systems:  No headache, visual changes, nausea, vomiting, diarrhea, constipation, dizziness, abdominal pain, skin rash, fevers, chills, night sweats, weight loss, swollen lymph nodes, body aches, joint swelling, chest pain, shortness of breath, mood changes. POSITIVE muscle aches  Objective  Blood pressure (!) 122/90, pulse 88, height 5' 4 (1.626 m), weight 142 lb (64.4 kg), SpO2 97%.   General: No apparent distress alert and oriented x3 mood and affect normal, dressed appropriately.  HEENT: Pupils equal, extraocular movements intact  Respiratory: Patient's  speak in full sentences and does not appear short of breath  Cardiovascular: No lower extremity edema, non tender, no erythema  Gait MSK:  Back back exam does have some loss lordosis.  Patient does have some scapular dyskinesis on the right side.  Patient does have some more tightness noted in the thoracolumbar juncture than usual right greater than left as well.  No abdominal pain on exam  Osteopathic findings  C2 flexed rotated and side bent right C6 flexed rotated and side bent left T3 extended rotated and side bent right inhaled rib T11 extended rotated and side bent right L1 flexed rotated and side bent right Sacrum right on right       Assessment and Plan:  Slipped rib syndrome Continue to have some difficulty.  Discussed posture and ergonomics.  Patient is going to try to do some more working out on a regular basis.  Has had some different health issues recently.  Has been found to have PCOS.  Is recently on metformin .  Watch for some of the different symptoms that patient is having as well.  Follow-up again in 6 to 8 weeks.    Nonallopathic problems  Decision today to treat with OMT was based on Physical Exam  After verbal consent patient was treated with HVLA, ME, FPR techniques in cervical, rib, thoracic, lumbar, and sacral  areas  Patient tolerated the procedure well with improvement in symptoms  Patient given exercises, stretches and lifestyle modifications  See medications in patient instructions if given  Patient will follow up in  4-8 weeks     The above documentation has been reviewed and is accurate and complete Stacy CHRISTELLA Sharps, DO         Note: This dictation was prepared with Dragon dictation along with smaller phrase technology. Any transcriptional errors that result from this process are unintentional.

## 2024-06-08 ENCOUNTER — Other Ambulatory Visit: Payer: Self-pay

## 2024-06-08 DIAGNOSIS — Z31430 Encounter of female for testing for genetic disease carrier status for procreative management: Secondary | ICD-10-CM | POA: Diagnosis not present

## 2024-06-08 DIAGNOSIS — Z319 Encounter for procreative management, unspecified: Secondary | ICD-10-CM | POA: Diagnosis not present

## 2024-06-08 DIAGNOSIS — Z31438 Encounter for other genetic testing of female for procreative management: Secondary | ICD-10-CM | POA: Diagnosis not present

## 2024-06-08 DIAGNOSIS — Z113 Encounter for screening for infections with a predominantly sexual mode of transmission: Secondary | ICD-10-CM | POA: Diagnosis not present

## 2024-06-08 MED ORDER — ESTRADIOL 0.1 MG/24HR TD PTTW
1.0000 | MEDICATED_PATCH | TRANSDERMAL | 3 refills | Status: AC
  Filled 2024-06-08: qty 8, 28d supply, fill #0

## 2024-06-10 ENCOUNTER — Ambulatory Visit: Admitting: Family Medicine

## 2024-06-10 ENCOUNTER — Ambulatory Visit (INDEPENDENT_AMBULATORY_CARE_PROVIDER_SITE_OTHER)

## 2024-06-10 VITALS — BP 122/90 | HR 88 | Ht 64.0 in | Wt 142.0 lb

## 2024-06-10 DIAGNOSIS — M9901 Segmental and somatic dysfunction of cervical region: Secondary | ICD-10-CM

## 2024-06-10 DIAGNOSIS — M9908 Segmental and somatic dysfunction of rib cage: Secondary | ICD-10-CM | POA: Diagnosis not present

## 2024-06-10 DIAGNOSIS — M9903 Segmental and somatic dysfunction of lumbar region: Secondary | ICD-10-CM | POA: Diagnosis not present

## 2024-06-10 DIAGNOSIS — R5383 Other fatigue: Secondary | ICD-10-CM | POA: Diagnosis not present

## 2024-06-10 DIAGNOSIS — M545 Low back pain, unspecified: Secondary | ICD-10-CM

## 2024-06-10 DIAGNOSIS — M9904 Segmental and somatic dysfunction of sacral region: Secondary | ICD-10-CM | POA: Diagnosis not present

## 2024-06-10 DIAGNOSIS — M9902 Segmental and somatic dysfunction of thoracic region: Secondary | ICD-10-CM

## 2024-06-10 DIAGNOSIS — M940 Chondrocostal junction syndrome [Tietze]: Secondary | ICD-10-CM

## 2024-06-10 NOTE — Patient Instructions (Addendum)
 Labs today Lumbar xray See me again in 8 weeks

## 2024-06-11 ENCOUNTER — Encounter: Payer: Self-pay | Admitting: Family Medicine

## 2024-06-11 NOTE — Assessment & Plan Note (Signed)
 Continue to have some difficulty.  Discussed posture and ergonomics.  Patient is going to try to do some more working out on a regular basis.  Has had some different health issues recently.  Has been found to have PCOS.  Is recently on metformin .  Watch for some of the different symptoms that patient is having as well.  Follow-up again in 6 to 8 weeks.

## 2024-06-19 ENCOUNTER — Ambulatory Visit: Payer: Self-pay | Admitting: Family Medicine

## 2024-06-19 ENCOUNTER — Other Ambulatory Visit

## 2024-06-19 DIAGNOSIS — R5383 Other fatigue: Secondary | ICD-10-CM | POA: Diagnosis not present

## 2024-06-19 DIAGNOSIS — E7849 Other hyperlipidemia: Secondary | ICD-10-CM | POA: Diagnosis not present

## 2024-06-19 LAB — IBC PANEL
Iron: 129 ug/dL (ref 42–145)
Saturation Ratios: 39.5 % (ref 20.0–50.0)
TIBC: 326.2 ug/dL (ref 250.0–450.0)
Transferrin: 233 mg/dL (ref 212.0–360.0)

## 2024-06-19 LAB — CBC WITH DIFFERENTIAL/PLATELET
Basophils Absolute: 0 K/uL (ref 0.0–0.1)
Basophils Relative: 1 % (ref 0.0–3.0)
Eosinophils Absolute: 0 K/uL (ref 0.0–0.7)
Eosinophils Relative: 1.8 % (ref 0.0–5.0)
HCT: 39.9 % (ref 36.0–46.0)
Hemoglobin: 13.5 g/dL (ref 12.0–15.0)
Lymphocytes Relative: 51.8 % — ABNORMAL HIGH (ref 12.0–46.0)
Lymphs Abs: 1.4 K/uL (ref 0.7–4.0)
MCHC: 33.8 g/dL (ref 30.0–36.0)
MCV: 87.8 fl (ref 78.0–100.0)
Monocytes Absolute: 0.3 K/uL (ref 0.1–1.0)
Monocytes Relative: 11.1 % (ref 3.0–12.0)
Neutro Abs: 0.9 K/uL — ABNORMAL LOW (ref 1.4–7.7)
Neutrophils Relative %: 34.3 % — ABNORMAL LOW (ref 43.0–77.0)
Platelets: 255 K/uL (ref 150.0–400.0)
RBC: 4.54 Mil/uL (ref 3.87–5.11)
RDW: 14 % (ref 11.5–15.5)
WBC: 2.7 K/uL — ABNORMAL LOW (ref 4.0–10.5)

## 2024-06-19 LAB — LIPID PANEL
Cholesterol: 222 mg/dL — ABNORMAL HIGH (ref 0–200)
HDL: 74.9 mg/dL (ref >39.00)
LDL Cholesterol: 134 mg/dL — ABNORMAL HIGH (ref 0–99)
NonHDL: 146.84
Total CHOL/HDL Ratio: 3
Triglycerides: 62 mg/dL (ref 0.0–149.0)
VLDL: 12.4 mg/dL (ref 0.0–40.0)

## 2024-06-19 LAB — COMPREHENSIVE METABOLIC PANEL WITH GFR
ALT: 10 U/L (ref 0–35)
AST: 17 U/L (ref 0–37)
Albumin: 4.4 g/dL (ref 3.5–5.2)
Alkaline Phosphatase: 43 U/L (ref 39–117)
BUN: 9 mg/dL (ref 6–23)
CO2: 28 meq/L (ref 19–32)
Calcium: 9.3 mg/dL (ref 8.4–10.5)
Chloride: 101 meq/L (ref 96–112)
Creatinine, Ser: 0.78 mg/dL (ref 0.40–1.20)
GFR: 99.97 mL/min (ref >60.00)
Glucose, Bld: 87 mg/dL (ref 70–99)
Potassium: 4 meq/L (ref 3.5–5.1)
Sodium: 138 meq/L (ref 135–145)
Total Bilirubin: 0.5 mg/dL (ref 0.2–1.2)
Total Protein: 7.8 g/dL (ref 6.0–8.3)

## 2024-06-19 LAB — TSH: TSH: 1.66 u[IU]/mL (ref 0.35–5.50)

## 2024-06-19 LAB — SEDIMENTATION RATE: Sed Rate: 15 mm/h (ref 0–20)

## 2024-06-19 LAB — VITAMIN D 25 HYDROXY (VIT D DEFICIENCY, FRACTURES): VITD: 62.96 ng/mL (ref 30.00–100.00)

## 2024-06-19 LAB — FERRITIN: Ferritin: 23.9 ng/mL (ref 10.0–291.0)

## 2024-06-20 ENCOUNTER — Ambulatory Visit: Payer: Self-pay | Admitting: Internal Medicine

## 2024-06-21 LAB — ANTI-NUCLEAR AB-TITER (ANA TITER): ANA Titer 1: 1:80 {titer} — ABNORMAL HIGH

## 2024-06-21 LAB — RHEUMATOID FACTOR: Rheumatoid fact SerPl-aCnc: <10 [IU]/mL (ref <14)

## 2024-06-21 LAB — ANA: Anti Nuclear Antibody (ANA): POSITIVE — AB

## 2024-06-21 LAB — ANTI-DNA ANTIBODY, DOUBLE-STRANDED: ds DNA Ab: <1 [IU]/mL

## 2024-06-23 DIAGNOSIS — Z3183 Encounter for assisted reproductive fertility procedure cycle: Secondary | ICD-10-CM | POA: Diagnosis not present

## 2024-06-26 ENCOUNTER — Other Ambulatory Visit: Payer: Self-pay

## 2024-06-26 ENCOUNTER — Encounter: Payer: Self-pay | Admitting: Internal Medicine

## 2024-06-26 MED ORDER — COVID-19 MRNA VAC-TRIS(PFIZER) 30 MCG/0.3ML IM SUSY
0.3000 mL | PREFILLED_SYRINGE | Freq: Once | INTRAMUSCULAR | 0 refills | Status: AC
  Filled 2024-06-26 – 2024-07-06 (×2): qty 0.3, 1d supply, fill #0

## 2024-06-30 DIAGNOSIS — Z319 Encounter for procreative management, unspecified: Secondary | ICD-10-CM | POA: Diagnosis not present

## 2024-07-01 ENCOUNTER — Other Ambulatory Visit: Payer: Self-pay

## 2024-07-01 ENCOUNTER — Other Ambulatory Visit (HOSPITAL_BASED_OUTPATIENT_CLINIC_OR_DEPARTMENT_OTHER): Payer: Self-pay

## 2024-07-01 MED ORDER — METFORMIN HCL ER (MOD) 1000 MG PO TB24
1000.0000 mg | ORAL_TABLET | Freq: Two times a day (BID) | ORAL | 4 refills | Status: AC
  Filled 2024-07-01 (×3): qty 90, 45d supply, fill #0

## 2024-07-01 MED ORDER — PROMETHAZINE HCL 12.5 MG PO TABS
12.5000 mg | ORAL_TABLET | ORAL | 0 refills | Status: DC | PRN
  Filled 2024-07-01: qty 10, 2d supply, fill #0

## 2024-07-01 MED ORDER — CABERGOLINE 0.5 MG PO TABS
0.5000 mg | ORAL_TABLET | Freq: Every day | ORAL | 1 refills | Status: DC
  Filled 2024-07-01: qty 8, 8d supply, fill #0

## 2024-07-01 MED ORDER — TRAMADOL HCL 50 MG PO TABS
ORAL_TABLET | ORAL | 0 refills | Status: AC
  Filled 2024-07-01: qty 8, 2d supply, fill #0

## 2024-07-01 MED ORDER — CABERGOLINE 0.5 MG PO TABS
0.5000 mg | ORAL_TABLET | Freq: Every day | ORAL | 1 refills | Status: AC
  Filled 2024-07-01: qty 8, 8d supply, fill #0

## 2024-07-01 MED ORDER — PROMETHAZINE HCL 12.5 MG PO TABS
12.5000 mg | ORAL_TABLET | ORAL | 0 refills | Status: AC
  Filled 2024-07-01: qty 10, 3d supply, fill #0

## 2024-07-01 MED ORDER — METFORMIN HCL ER 500 MG PO TB24
1000.0000 mg | ORAL_TABLET | Freq: Two times a day (BID) | ORAL | 3 refills | Status: AC
  Filled 2024-07-01: qty 120, 30d supply, fill #0
  Filled 2024-08-24: qty 120, 30d supply, fill #1

## 2024-07-06 ENCOUNTER — Other Ambulatory Visit: Payer: Self-pay

## 2024-08-03 NOTE — Progress Notes (Unsigned)
 [name] [address] [place] [address] Phone: [phone] Subjective:   Stacy [name], am serving as a scribe for Dr. Arthea Claudene.  I'm seeing this patient by the request  of:  Geofm Glade PARAS, MD  CC: Back and neck pain follow-up  YEP:Dlagzrupcz  Stacy [name] is a 33 y.o. female coming in with complaint of back and neck pain. OMT on 06/10/2024. Patient states that she hjust got back from traveling. R hip feels off after sleeping on a hard mattress. Neck has been popping more often.   Medications patient has been prescribed:   Taking:         Reviewed prior external information including notes and imaging from previsou exam, outside providers and external EMR if available.   As well as notes that were available from care everywhere and other healthcare systems.  Past medical history, social, surgical and family history all reviewed in electronic medical record.  No pertanent information unless stated regarding to the chief complaint.   Past Medical History:  Diagnosis Date   Anemia    Dysplastic nevus 06/03/2023   Right mid central palm/palmar hand - moderate-severe   Hyperlipidemia     Allergies  Allergen Reactions   Doxycycline Other (See Comments)    Decreased WBC, Neutropenia   Acetaminophen Other (See Comments)    Cough, throat irritation     Review of Systems:  No headache, visual changes, nausea, vomiting, diarrhea, constipation, dizziness, abdominal pain, skin rash, fevers, chills, night sweats, weight loss, swollen lymph nodes, body aches, joint swelling, chest pain, shortness of breath, mood changes. POSITIVE muscle aches  Objective  Blood pressure 110/78, pulse 87, height 5' 4 (1.626 m), weight 135 lb (61.2 kg), SpO2 98%.   General: No apparent distress alert and oriented x3 mood and affect normal, dressed appropriately.  HEENT: Pupils equal, extraocular movements intact  Respiratory: Patient's speak in  full sentences and does not appear short of breath  Cardiovascular: No lower extremity edema, non tender, no erythema  Gait MSK:  Back does have hypermobility noted.  Some tenderness noted in the parascapular area.  Tightness with sidebending of the neck bilaterally.  Osteopathic findings  C2 flexed rotated and side bent right C7 flexed rotated and side bent left T3 extended rotated and side bent right inhaled rib T4 extended rotated and side bent left inhaled rib T9 extended rotated and side bent left inhaled rib L2 flexed rotated and side bent right Sacrum right on right       Assessment and Plan:  Slipped rib syndrome Suppurative syndrome.  Does respond well to manipulation.  Discussed icing regimen and home exercises.  Discussed with patient to increase activity slowly as usual.  Continue to work on [employer] cabin [employer].  No change in medication at this time.  Follow-up again in 6 to 8 weeks    Nonallopathic problems  Decision today to treat with OMT was based on Physical Exam  After verbal consent patient was treated with HVLA, ME, FPR techniques in cervical, rib, thoracic, lumbar, and sacral  areas  Patient tolerated the procedure well with improvement in symptoms  Patient given exercises, stretches and lifestyle modifications  See medications in patient instructions if given  Patient will follow up in 4-8 weeks    The above documentation has been reviewed and is accurate and complete Locklan Canoy M Tayvien Kane, DO          Note: This dictation was prepared with Dragon dictation  along with smaller phrase technology. Any transcriptional errors that result from this process are unintentional.

## 2024-08-05 ENCOUNTER — Ambulatory Visit: Admitting: Family Medicine

## 2024-08-05 VITALS — BP 110/78 | HR 87 | Ht 64.0 in | Wt 135.0 lb

## 2024-08-05 DIAGNOSIS — M9908 Segmental and somatic dysfunction of rib cage: Secondary | ICD-10-CM

## 2024-08-05 DIAGNOSIS — M9901 Segmental and somatic dysfunction of cervical region: Secondary | ICD-10-CM | POA: Diagnosis not present

## 2024-08-05 DIAGNOSIS — M9904 Segmental and somatic dysfunction of sacral region: Secondary | ICD-10-CM | POA: Diagnosis not present

## 2024-08-05 DIAGNOSIS — M9902 Segmental and somatic dysfunction of thoracic region: Secondary | ICD-10-CM

## 2024-08-05 DIAGNOSIS — M940 Chondrocostal junction syndrome [Tietze]: Secondary | ICD-10-CM

## 2024-08-05 DIAGNOSIS — M9903 Segmental and somatic dysfunction of lumbar region: Secondary | ICD-10-CM | POA: Diagnosis not present

## 2024-08-05 NOTE — Patient Instructions (Signed)
 Good to see you Doing great See me in 2-3 months

## 2024-08-05 NOTE — Assessment & Plan Note (Signed)
 Suppurative syndrome.  Does respond well to manipulation.  Discussed icing regimen and home exercises.  Discussed with patient to increase activity slowly as usual.  Continue to work on Air cabin crew.  No change in medication at this time.  Follow-up again in 6 to 8 weeks

## 2024-08-06 ENCOUNTER — Encounter: Payer: Self-pay | Admitting: Family Medicine

## 2024-08-21 ENCOUNTER — Ambulatory Visit

## 2024-08-21 ENCOUNTER — Ambulatory Visit
Admission: RE | Admit: 2024-08-21 | Discharge: 2024-08-21 | Disposition: A | Discharge status: Home / Self Care | Source: Ambulatory Visit | Attending: Obstetrics and Gynecology | Admitting: Obstetrics and Gynecology

## 2024-08-21 DIAGNOSIS — R921 Mammographic calcification found on diagnostic imaging of breast: Secondary | ICD-10-CM | POA: Diagnosis not present

## 2024-08-24 ENCOUNTER — Encounter: Payer: Self-pay | Admitting: Internal Medicine

## 2024-08-24 ENCOUNTER — Other Ambulatory Visit: Payer: Self-pay

## 2024-08-25 ENCOUNTER — Other Ambulatory Visit: Payer: Self-pay

## 2024-08-25 ENCOUNTER — Ambulatory Visit: Payer: Commercial Managed Care - PPO | Admitting: Dermatology

## 2024-08-25 MED ORDER — LEVOCETIRIZINE DIHYDROCHLORIDE 5 MG PO TABS
5.0000 mg | ORAL_TABLET | Freq: Every evening | ORAL | 3 refills | Status: AC
  Filled 2024-08-25: qty 90, 90d supply, fill #0
  Filled 2024-11-10: qty 90, 90d supply, fill #1

## 2024-08-26 ENCOUNTER — Ambulatory Visit: Payer: Commercial Managed Care - PPO | Admitting: Dermatology

## 2024-08-26 ENCOUNTER — Other Ambulatory Visit: Payer: Self-pay

## 2024-09-07 ENCOUNTER — Other Ambulatory Visit: Payer: Self-pay | Admitting: Internal Medicine

## 2024-09-07 ENCOUNTER — Other Ambulatory Visit: Payer: Self-pay

## 2024-09-07 MED ORDER — KETOCONAZOLE 2 % EX SHAM
1.0000 | MEDICATED_SHAMPOO | CUTANEOUS | 5 refills | Status: AC
  Filled 2024-09-07: qty 120, 84d supply, fill #0

## 2024-09-23 ENCOUNTER — Other Ambulatory Visit: Payer: Self-pay

## 2024-09-23 DIAGNOSIS — E282 Polycystic ovarian syndrome: Secondary | ICD-10-CM | POA: Diagnosis not present

## 2024-09-23 DIAGNOSIS — Z9189 Other specified personal risk factors, not elsewhere classified: Secondary | ICD-10-CM | POA: Diagnosis not present

## 2024-09-23 MED ORDER — METFORMIN HCL ER 500 MG PO TB24
1000.0000 mg | ORAL_TABLET | Freq: Every day | ORAL | 2 refills | Status: AC
  Filled 2024-09-23: qty 180, 90d supply, fill #0

## 2024-09-24 ENCOUNTER — Other Ambulatory Visit: Payer: Self-pay | Admitting: Obstetrics and Gynecology

## 2024-09-24 DIAGNOSIS — Z9189 Other specified personal risk factors, not elsewhere classified: Secondary | ICD-10-CM

## 2024-09-28 ENCOUNTER — Other Ambulatory Visit: Payer: Self-pay | Admitting: Internal Medicine

## 2024-09-29 ENCOUNTER — Other Ambulatory Visit: Payer: Self-pay

## 2024-09-29 MED ORDER — PROPRANOLOL HCL 10 MG PO TABS
10.0000 mg | ORAL_TABLET | Freq: Every day | ORAL | 0 refills | Status: AC | PRN
  Filled 2024-09-29: qty 30, 30d supply, fill #0

## 2024-09-30 NOTE — Progress Notes (Unsigned)
°  [name] [address] [place] [address] Phone: [phone] Subjective:   Stacy [name], am serving as a scribe for Dr. Arthea Claudene.  I'm seeing this patient by the request  of:  Geofm Glade PARAS, MD  CC:   YEP:Dlagzrupcz  Stacy [name] is a 33 y.o. female coming in with complaint of back and neck pain. OMT on 08/05/2024. Patient states that she noticed a knot on the right side over the ribs. It is not present on the L side. No pain.   Back and neck are the same as last visit.   Medications patient has been prescribed:   Taking:         Reviewed prior external information including notes and imaging from previsou exam, outside providers and external EMR if available.   As well as notes that were available from care everywhere and other healthcare systems.  Past medical history, social, surgical and family history all reviewed in electronic medical record.  No pertanent information unless stated regarding to the chief complaint.   Past Medical History:  Diagnosis Date   Anemia    Dysplastic nevus 06/03/2023   Right mid central palm/palmar hand - moderate-severe   Hyperlipidemia     Allergies[1]   Review of Systems:  No headache, visual changes, nausea, vomiting, diarrhea, constipation, dizziness, abdominal pain, skin rash, fevers, chills, night sweats, weight loss, swollen lymph nodes, body aches, joint swelling, chest pain, shortness of breath, mood changes. POSITIVE muscle aches  Objective  There were no vitals taken for this visit.   General: No apparent distress alert and oriented x3 mood and affect normal, dressed appropriately.  HEENT: Pupils equal, extraocular movements intact  Respiratory: Patient's speak in full sentences and does not appear short of breath  Cardiovascular: No lower extremity edema, non tender, no erythema  Gait MSK:  Back   Osteopathic findings  C2 flexed rotated and side bent right C6  flexed rotated and side bent left T3 extended rotated and side bent right inhaled rib T9 extended rotated and side bent left L2 flexed rotated and side bent right Sacrum right on right       Assessment and Plan:  No problem-specific Assessment & Plan notes found for this encounter.    Nonallopathic problems  Decision today to treat with OMT was based on Physical Exam  After verbal consent patient was treated with HVLA, ME, FPR techniques in cervical, rib, thoracic, lumbar, and sacral  areas  Patient tolerated the procedure well with improvement in symptoms  Patient given exercises, stretches and lifestyle modifications  See medications in patient instructions if given  Patient will follow up in 4-8 weeks             Note: This dictation was prepared with Dragon dictation along with smaller phrase technology. Any transcriptional errors that result from this process are unintentional.            [1]  Allergies Allergen Reactions   Doxycycline Other (See Comments)    Decreased WBC, Neutropenia   Acetaminophen Other (See Comments)    Cough, throat irritation

## 2024-10-01 ENCOUNTER — Ambulatory Visit: Admitting: Family Medicine

## 2024-10-01 VITALS — BP 110/82 | HR 105 | Ht 64.0 in | Wt 133.0 lb

## 2024-10-01 DIAGNOSIS — M9904 Segmental and somatic dysfunction of sacral region: Secondary | ICD-10-CM

## 2024-10-01 DIAGNOSIS — M9903 Segmental and somatic dysfunction of lumbar region: Secondary | ICD-10-CM | POA: Diagnosis not present

## 2024-10-01 DIAGNOSIS — M9908 Segmental and somatic dysfunction of rib cage: Secondary | ICD-10-CM | POA: Diagnosis not present

## 2024-10-01 DIAGNOSIS — M9902 Segmental and somatic dysfunction of thoracic region: Secondary | ICD-10-CM

## 2024-10-01 DIAGNOSIS — M9901 Segmental and somatic dysfunction of cervical region: Secondary | ICD-10-CM

## 2024-10-01 DIAGNOSIS — M357 Hypermobility syndrome: Secondary | ICD-10-CM | POA: Diagnosis not present

## 2024-10-01 NOTE — Patient Instructions (Signed)
 Good to see you Have a great holiday Stay active See me in 2-3 months

## 2024-10-02 ENCOUNTER — Encounter: Payer: Self-pay | Admitting: Family Medicine

## 2024-10-02 NOTE — Assessment & Plan Note (Signed)
 Continuing to work on strengthening exercises.  Discussed icing regimen and home exercises, discussed which activities to do and which ones to avoid.  Continuing to increase activity slowly.  Discussed which activities to do and which ones to avoid.  Patient still can have anxiety which is a social determinant of health.  Follow-up with me again in 6 to 12 weeks

## 2024-11-27 ENCOUNTER — Other Ambulatory Visit

## 2024-12-02 ENCOUNTER — Other Ambulatory Visit

## 2024-12-09 ENCOUNTER — Ambulatory Visit: Admitting: Family Medicine

## 2024-12-30 ENCOUNTER — Ambulatory Visit: Admitting: Dermatology

## 2025-01-13 ENCOUNTER — Ambulatory Visit: Admitting: Dermatology

## 2025-05-28 ENCOUNTER — Encounter: Admitting: Internal Medicine
